# Patient Record
Sex: Female | Born: 1985 | Hispanic: Yes | Marital: Single | Smoking: Never smoker
Health system: Southern US, Community
[De-identification: ages and names within clinical notes are randomized; demographics above are authoritative.]

## PROBLEM LIST (undated history)

## (undated) ENCOUNTER — Inpatient Hospital Stay (HOSPITAL_COMMUNITY): Payer: Self-pay

## (undated) DIAGNOSIS — O24419 Gestational diabetes mellitus in pregnancy, unspecified control: Secondary | ICD-10-CM

---

## 2006-01-17 ENCOUNTER — Inpatient Hospital Stay (HOSPITAL_COMMUNITY): Admission: AD | Admit: 2006-01-17 | Discharge: 2006-01-20 | Payer: Self-pay | Admitting: Obstetrics

## 2006-05-21 ENCOUNTER — Emergency Department (HOSPITAL_COMMUNITY): Admission: EM | Admit: 2006-05-21 | Discharge: 2006-05-21 | Payer: Self-pay | Admitting: Emergency Medicine

## 2009-11-17 ENCOUNTER — Emergency Department (HOSPITAL_COMMUNITY): Admission: EM | Admit: 2009-11-17 | Discharge: 2009-11-17 | Payer: Self-pay | Admitting: Emergency Medicine

## 2011-07-19 ENCOUNTER — Other Ambulatory Visit: Payer: Self-pay

## 2011-07-19 ENCOUNTER — Emergency Department (HOSPITAL_COMMUNITY)
Admission: EM | Admit: 2011-07-19 | Discharge: 2011-07-19 | Disposition: A | Discharge status: Home / Self Care | Payer: Self-pay | Attending: Emergency Medicine | Admitting: Emergency Medicine

## 2011-07-19 DIAGNOSIS — K5289 Other specified noninfective gastroenteritis and colitis: Secondary | ICD-10-CM | POA: Insufficient documentation

## 2011-07-19 DIAGNOSIS — R42 Dizziness and giddiness: Secondary | ICD-10-CM

## 2011-07-19 DIAGNOSIS — Z136 Encounter for screening for cardiovascular disorders: Secondary | ICD-10-CM | POA: Insufficient documentation

## 2011-07-19 DIAGNOSIS — K529 Noninfective gastroenteritis and colitis, unspecified: Secondary | ICD-10-CM

## 2011-07-19 LAB — DIFFERENTIAL
Eosinophils Relative: 0 % (ref 0–5)
Lymphocytes Relative: 16 % (ref 12–46)
Lymphs Abs: 1 10*3/uL (ref 0.7–4.0)
Neutro Abs: 4.6 10*3/uL (ref 1.7–7.7)

## 2011-07-19 LAB — COMPREHENSIVE METABOLIC PANEL
ALT: 20 U/L (ref 0–35)
AST: 24 U/L (ref 0–37)
Alkaline Phosphatase: 56 U/L (ref 39–117)
CO2: 20 mEq/L (ref 19–32)
Calcium: 8.1 mg/dL — ABNORMAL LOW (ref 8.4–10.5)
Chloride: 108 mEq/L (ref 96–112)
GFR calc Af Amer: >90 mL/min (ref >90)
GFR calc non Af Amer: >90 mL/min (ref >90)
Glucose, Bld: 89 mg/dL (ref 70–99)
Potassium: 3.4 mEq/L — ABNORMAL LOW (ref 3.5–5.1)
Sodium: 138 mEq/L (ref 135–145)
Total Bilirubin: 0.2 mg/dL — ABNORMAL LOW (ref 0.3–1.2)

## 2011-07-19 LAB — URINALYSIS, ROUTINE W REFLEX MICROSCOPIC
Bilirubin Urine: NEGATIVE
Hgb urine dipstick: NEGATIVE
Ketones, ur: NEGATIVE mg/dL
Nitrite: NEGATIVE
Protein, ur: NEGATIVE mg/dL
Urobilinogen, UA: 0.2 mg/dL (ref 0.0–1.0)

## 2011-07-19 LAB — CBC
MCV: 93.3 fL (ref 78.0–100.0)
Platelets: 261 10*3/uL (ref 150–400)
RBC: 4.33 MIL/uL (ref 3.87–5.11)
WBC: 6.4 10*3/uL (ref 4.0–10.5)

## 2011-07-19 MED ORDER — SODIUM CHLORIDE 0.9 % IV BOLUS (SEPSIS)
1000.0000 mL | Freq: Once | INTRAVENOUS | Status: AC
  Administered 2011-07-19: 1000 mL via INTRAVENOUS

## 2011-07-19 MED ORDER — ONDANSETRON HCL 4 MG/2ML IJ SOLN
4.0000 mg | Freq: Once | INTRAMUSCULAR | Status: AC
  Administered 2011-07-19: 4 mg via INTRAVENOUS
  Filled 2011-07-19: qty 2

## 2011-07-19 MED ORDER — PROMETHAZINE HCL 25 MG PO TABS: 25.0000 mg | ORAL_TABLET | Freq: Four times a day (QID) | ORAL | Status: DC | PRN

## 2011-07-19 NOTE — ED Notes (Signed)
Diarrhea  Since last night with headache and body aches.  Pt. Denies any vomiting is nauseated

## 2011-07-19 NOTE — Discharge Instructions (Signed)
Gastroenteritis viral (Viral Gastroenteritis) La gastroenteritis viral tambin es conocida como gripe del estmago. Este trastorno afecta el estmago y el tubo digestivo. Puede causar diarrea y vmitos repentinos. La enfermedad generalmente dura entre 3 y 8 das. La mayora de las personas desarrolla una respuesta inmunolgica. Con el tiempo, esto elimina el virus. Mientras se desarrolla esta respuesta natural, el virus puede afectar en forma importante su salud.  CAUSAS Muchos virus diferentes pueden causar gastroenteritis, por ejemplo el rotavirus o el norovirus. Estos virus pueden contagiarse al consumir alimentos o agua contaminados. Tambin puede contagiarse al compartir utensilios u otros artculos personales con una persona infectada o al tocar una superficie contaminada.  SNTOMAS Los sntomas ms comunes son diarrea y vmitos. Estos problemas pueden causar una prdida grave de lquidos corporales(deshidratacin) y un desequilibrio de sales corporales(electrolitos). Otros sntomas pueden ser:   Fiebre.   Dolor de cabeza.   Fatiga.   Dolor abdominal.  DIAGNSTICO  El mdico podr hacer el diagnstico de gastroenteritis viral basndose en los sntomas y el examen fsico Tambin pueden tomarle una muestra de materia fecal para diagnosticar la presencia de virus u otras infecciones.  TRATAMIENTO Esta enfermedad generalmente desaparece sin tratamiento. Los tratamientos estn dirigidos a la rehidratacin. Los casos ms graves de gastroenteritis viral implican vmitos tan intensos que no es posible retener lquidos. En estos casos, los lquidos deben administrarse a travs de una va intravenosa (IV).  INSTRUCCIONES PARA EL CUIDADO DOMICILIARIO  Beba suficientes lquidos para mantener la orina clara o de color amarillo plido. Beba pequeas cantidades de lquido con frecuencia y aumente la cantidad segn la tolerancia.   Pida instrucciones especficas a su mdico con respecto a la  rehidratacin.   Evite:   Alimentos que tengan mucha azcar.   Alcohol.   Gaseosas.   Tabaco.   Jugos.   Bebidas con cafena.   Lquidos muy calientes o fros.   Alimentos muy grasos.   Comer demasiado a la vez.   Productos lcteos hasta 24 a 48 horas despus de que se detenga la diarrea.   Puede consumir probiticos. Los probiticos son cultivos activos de bacterias beneficiosas. Pueden disminuir la cantidad y el nmero de deposiciones diarreicas en el adulto. Se encuentran en los yogures con cultivos activos y en los suplementos.   Lave bien sus manos para evitar que se disemine el virus.   Slo tome medicamentos de venta libre o recetados para calmar el dolor, las molestias o bajar la fiebre segn las indicaciones de su mdico. No administre aspirina a los nios. Los medicamentos antidiarreicos no son recomendables.   Consulte a su mdico si puede seguir tomando sus medicamentos recetados o de venta libre.   Cumpla con todas las visitas de control, segn le indique su mdico.  SOLICITE ATENCIN MDICA DE INMEDIATO SI:  No puede retener lquidos.   No hay emisin de orina durante 6 a 8 horas.   Le falta el aire.   Observa sangre en el vmito (se ve como caf molido) o en la materia fecal.   Siente dolor abdominal que empeora o se concentra en una zona pequea (se localiza).   Tiene nuseas o vmitos persistentes.   Tiene fiebre.   El paciente es un nio menor de 3 meses y tiene fiebre.   El paciente es un nio mayor de 3 meses, tiene fiebre y sntomas persistentes.   El paciente es un nio mayor de 3 meses y tiene fiebre y sntomas que empeoran repentinamente.   El paciente es   un beb y no tiene lgrimas cuando llora.  ASEGRESE QUE:   Comprende estas instrucciones.   Controlar su enfermedad.   Solicitar ayuda inmediatamente si no mejora o si empeora.  Document Released: 04/26/2005 Document Revised: 04/15/2011 [employer] Patient Information 2012  [employer]. 

## 2011-07-19 NOTE — ED Provider Notes (Addendum)
History     CSN: [id number]  Arrival date & time 07/19/11  0807   First MD Initiated Contact with Patient 07/19/11 [phone]      Chief Complaint  Patient presents with  . Diarrhea    (Consider location/radiation/quality/duration/timing/severity/associated sxs/prior treatment) HPI Pt states that she has had 20-25 episode of diarrhea since yesterday. No blood in stool. She has had nausea and dizziness worse with changing position. She c/o diffuse body aches and stomach cramping. No hearing changes, sore throat, rash, sick contacts, bad food exposure.  No past medical history on file.  No past surgical history on file.  No family history on file.  History  Substance Use Topics  . Smoking status: Not on file  . Smokeless tobacco: Not on file  . Alcohol Use: Not on file    OB History    No data available      Review of Systems  Constitutional: Positive for fatigue. Negative for fever and chills.  HENT: Negative for facial swelling and neck pain.   Respiratory: Negative for shortness of breath.   Cardiovascular: Negative for chest pain and leg swelling.  Gastrointestinal: Positive for nausea and diarrhea. Negative for vomiting, abdominal pain and blood in stool.  Genitourinary: Negative for dysuria.  Musculoskeletal: Positive for myalgias.  Neurological: Positive for dizziness. Negative for weakness and numbness.    Allergies  Review of patient's allergies indicates no known allergies.  Home Medications   Current Outpatient Rx  Name Route Sig Dispense Refill  . PROMETHAZINE HCL 25 MG PO TABS Oral Take 1 tablet (25 mg total) by mouth every 6 (six) hours as needed for nausea. 30 tablet 0    BP 106/72  Pulse 85  Temp(Src) 98.3 F (36.8 C) (Oral)  Resp 20  SpO2 100%  Physical Exam  Nursing note and vitals reviewed. Constitutional: She is oriented to person, place, and time. She appears well-developed and well-nourished. No distress.  HENT:  Head: Normocephalic and  atraumatic.  Mouth/Throat: Oropharynx is clear and moist. No oropharyngeal exudate.       Bl TM's normal  Eyes: EOM are normal. Pupils are equal, round, and reactive to light.       No nystagmus  Neck: Normal range of motion. Neck supple.  Cardiovascular: Normal rate and regular rhythm.   Pulmonary/Chest: Effort normal and breath sounds normal. No respiratory distress. She has no wheezes. She has no rales.  Abdominal: Soft. Bowel sounds are normal. There is no tenderness. There is no rebound and no guarding.  Musculoskeletal: Normal range of motion. She exhibits no edema and no tenderness.  Neurological: She is alert and oriented to person, place, and time.       5/5 motor, sensation intact  Skin: Skin is warm and dry. No rash noted. No erythema.  Psychiatric: She has a normal mood and affect. Her behavior is normal.    ED Course  Procedures (including critical care time)  Labs Reviewed  COMPREHENSIVE METABOLIC PANEL - Abnormal; Notable for the following:    Potassium 3.4 (*)    Creatinine, Ser 0.48 (*)    Calcium 8.1 (*)    Total Bilirubin 0.2 (*)    All other components within normal limits  CBC  DIFFERENTIAL  URINALYSIS, ROUTINE W REFLEX MICROSCOPIC  PREGNANCY, URINE   No results found.   1. Gastroenteritis   2. Dizziness      Date: 07/19/2011  Rate: 79  Rhythm: normal sinus rhythm  QRS Axis: normal  Intervals: normal  ST/T Wave abnormalities: normal  Conduction Disutrbances:none  Narrative Interpretation:   Old EKG Reviewed: none available    MDM   Pt states dizziness has improved. Tolerating PO's. Will d/c home. Return for any concerns       Loren Racer, MD 07/19/11 1200  Loren Racer, MD 07/19/11 [phone]

## 2011-07-19 NOTE — ED Notes (Signed)
MD at bedside. 

## 2011-07-19 NOTE — ED Notes (Signed)
Pt ambulated to restroom, gait steady.

## 2011-07-19 NOTE — ED Notes (Signed)
Gave ECG  To Dr. Lenny Pastel after I performed with trainee EMT Rosanne Ashing 10:12 JG.

## 2011-11-16 LAB — CYTOLOGY - PAP: Pap: NEGATIVE

## 2011-12-15 ENCOUNTER — Other Ambulatory Visit (HOSPITAL_COMMUNITY): Payer: Self-pay | Admitting: Nurse Practitioner

## 2011-12-15 DIAGNOSIS — Z1389 Encounter for screening for other disorder: Secondary | ICD-10-CM

## 2011-12-15 LAB — OB RESULTS CONSOLE ABO/RH: RH Type: POSITIVE

## 2011-12-15 LAB — OB RESULTS CONSOLE HGB/HCT, BLOOD
HCT: 39 %
Hemoglobin: 13.2 g/dL

## 2011-12-15 LAB — OB RESULTS CONSOLE PLATELET COUNT: Platelets: 229 10*3/uL

## 2011-12-15 LAB — OB RESULTS CONSOLE HIV ANTIBODY (ROUTINE TESTING): HIV: NONREACTIVE

## 2011-12-15 LAB — CULTURE, OB URINE: Urine Culture, OB: NEGATIVE

## 2011-12-16 LAB — OB RESULTS CONSOLE HEPATITIS B SURFACE ANTIGEN: Hepatitis B Surface Ag: NEGATIVE

## 2011-12-16 LAB — OB RESULTS CONSOLE VARICELLA ZOSTER ANTIBODY, IGG: Varicella: NON-IMMUNE/NOT IMMUNE

## 2011-12-20 DIAGNOSIS — O09899 Supervision of other high risk pregnancies, unspecified trimester: Secondary | ICD-10-CM

## 2011-12-20 DIAGNOSIS — IMO0002 Reserved for concepts with insufficient information to code with codable children: Secondary | ICD-10-CM | POA: Insufficient documentation

## 2011-12-22 DIAGNOSIS — O280 Abnormal hematological finding on antenatal screening of mother: Secondary | ICD-10-CM

## 2011-12-22 DIAGNOSIS — O0990 Supervision of high risk pregnancy, unspecified, unspecified trimester: Secondary | ICD-10-CM

## 2011-12-22 DIAGNOSIS — IMO0002 Reserved for concepts with insufficient information to code with codable children: Secondary | ICD-10-CM

## 2011-12-22 DIAGNOSIS — O09899 Supervision of other high risk pregnancies, unspecified trimester: Secondary | ICD-10-CM

## 2011-12-23 ENCOUNTER — Ambulatory Visit (INDEPENDENT_AMBULATORY_CARE_PROVIDER_SITE_OTHER): Payer: Self-pay | Admitting: Family

## 2011-12-23 ENCOUNTER — Ambulatory Visit (HOSPITAL_COMMUNITY)
Admission: RE | Admit: 2011-12-23 | Discharge: 2011-12-23 | Disposition: A | Discharge status: Home / Self Care | Payer: Medicaid Other | Source: Ambulatory Visit | Attending: Nurse Practitioner | Admitting: Nurse Practitioner

## 2011-12-23 ENCOUNTER — Encounter: Payer: Self-pay | Admitting: Family

## 2011-12-23 VITALS — BP 91/61 | Temp 97.1°F | Ht 60.0 in | Wt 110.6 lb

## 2011-12-23 DIAGNOSIS — Z363 Encounter for antenatal screening for malformations: Secondary | ICD-10-CM | POA: Insufficient documentation

## 2011-12-23 DIAGNOSIS — IMO0002 Reserved for concepts with insufficient information to code with codable children: Secondary | ICD-10-CM

## 2011-12-23 DIAGNOSIS — O0990 Supervision of high risk pregnancy, unspecified, unspecified trimester: Secondary | ICD-10-CM

## 2011-12-23 DIAGNOSIS — Z1389 Encounter for screening for other disorder: Secondary | ICD-10-CM | POA: Insufficient documentation

## 2011-12-23 DIAGNOSIS — O368990 Maternal care for other specified fetal problems, unspecified trimester, not applicable or unspecified: Secondary | ICD-10-CM | POA: Insufficient documentation

## 2011-12-23 DIAGNOSIS — O09219 Supervision of pregnancy with history of pre-term labor, unspecified trimester: Secondary | ICD-10-CM

## 2011-12-23 DIAGNOSIS — O358XX0 Maternal care for other (suspected) fetal abnormality and damage, not applicable or unspecified: Secondary | ICD-10-CM | POA: Insufficient documentation

## 2011-12-23 LAB — POCT URINALYSIS DIP (DEVICE)
Glucose, UA: NEGATIVE mg/dL
Nitrite: NEGATIVE
Protein, ur: NEGATIVE mg/dL
Specific Gravity, Urine: 1.015 (ref 1.005–1.030)
Urobilinogen, UA: 0.2 mg/dL (ref 0.0–1.0)

## 2011-12-23 NOTE — Progress Notes (Signed)
P= 70 Occasional abdominal pain and lower back

## 2011-12-23 NOTE — Progress Notes (Signed)
[facility] to [facility] of [facility]; pt reports [facility] at [facility], pap smear and states she agreed to quad screen (results pending).  Offered pt 17p and reviewed benefits of weekly injections, also reviewed risks of preterm birth with hx of prior preterm birth and possible sequelae of early delivery; pt declines 17p and accepts risks and potential consequences of preterm birth.  Reviewed ultrasound results with pt (marginal cord) and need for integral scan for growth.

## 2011-12-27 DIAGNOSIS — Z2839 Other underimmunization status: Secondary | ICD-10-CM | POA: Insufficient documentation

## 2011-12-27 DIAGNOSIS — O09899 Supervision of other high risk pregnancies, unspecified trimester: Secondary | ICD-10-CM | POA: Insufficient documentation

## 2011-12-27 DIAGNOSIS — O280 Abnormal hematological finding on antenatal screening of mother: Secondary | ICD-10-CM | POA: Insufficient documentation

## 2012-01-13 ENCOUNTER — Encounter: Payer: Self-pay | Admitting: Obstetrics and Gynecology

## 2012-01-13 ENCOUNTER — Ambulatory Visit (INDEPENDENT_AMBULATORY_CARE_PROVIDER_SITE_OTHER): Payer: Self-pay | Admitting: Obstetrics and Gynecology

## 2012-01-13 VITALS — BP 99/67 | Temp 99.0°F | Wt 113.8 lb

## 2012-01-13 DIAGNOSIS — O368990 Maternal care for other specified fetal problems, unspecified trimester, not applicable or unspecified: Secondary | ICD-10-CM

## 2012-01-13 DIAGNOSIS — O09219 Supervision of pregnancy with history of pre-term labor, unspecified trimester: Secondary | ICD-10-CM

## 2012-01-13 DIAGNOSIS — O280 Abnormal hematological finding on antenatal screening of mother: Secondary | ICD-10-CM

## 2012-01-13 LAB — POCT URINALYSIS DIP (DEVICE)
Glucose, UA: NEGATIVE mg/dL
Ketones, ur: NEGATIVE mg/dL
Protein, ur: NEGATIVE mg/dL
Specific Gravity, Urine: 1.01 (ref 1.005–1.030)
Urobilinogen, UA: 0.2 mg/dL (ref 0.0–1.0)

## 2012-01-13 NOTE — Progress Notes (Signed)
Pulse- 74 Pain- low back

## 2012-01-13 NOTE — Patient Instructions (Signed)
Parto prematuro  (Preterm Labor) El parto prematuro comienza antes de la semana 37 de embarazo. La duracin de un embarazo normal es de 39 a 41 semanas.  CAUSAS  Generalmente no hay una causa que pueda identificarse. Sin embargo, una de las causas conocidas ms frecuentes son las infecciones. Las infecciones del tero, el cuello, la vagina, el lquido amnitico, la vejiga, los riones y hasta de los pulmones (neumona) pueden hacer que el trabajo de parto se inicie. Otras causas son:   Infecciones urogenitales, como infecciones por hongos y vaginosis bacteriana.   Anormalidades uterinas (forma del tero, sptum uterino, fibromas, hemorragias en la placenta).   Un cuello que ha sido operado y se abre prematuramente.   Malformaciones del beb.   Gestaciones mltiples (mellizos, trillizos y ms).   Ruptura del saco amnitico.  :Otros factores de riesgo del parto prematuro son   Historia previa de parto prematuro.   Ruptura prematura de las membranas.   La placenta cubre la apertura del cuello (placenta previa).   La placenta se separa del tero (abrupcin placentaria).   El cuello es demasiado dbil para contener al beb en el tero (cuello incompetente).   Hay mucho lquido en el saco amnitico (polihidramnios).   Consumo de drogas o hbito de fumar durante el embarazo.   No aumentar de peso lo suficiente durante el embarazo.   Mujeres menores de 18 aos o mayores de 35 aos aos.   Nivel socioeconmico bajo.   Raza afroamericana.  SNTOMAS  Los signos y sntomas son:   Clicos del tipo menstrual   Contracciones con un intervalo entre 30 y 70 segundos, comienzan a ser regulares, se hacen ms frecuentes y se hacen ms intensas y dolorosas.   Contracciones que comienzan en la parte superior del tero y se expanden hacia abajo, hacia la zona inferior del abdomen y la espalda.   Sensacin de presin en la pelvis o dolor en la espalda.   Aparece una secrecin acuosa o  sanguinolenta por la vagina.  DIAGNSTICO  El diagnstico puede confirmarse:   Con un examen vaginal.   Ecografa del cuello.   Muestra (hisopado) de las secreciones crvico-vaginales. Estas muestras se analizan para buscar la presencia de fibronectina fetal. Esta protena que se encuentra en las secreciones del tero y se asocia con el parto prematuro.   Monitoreo fetal  TRATAMIENTO  Segn el tiempo del embarazo y otras circunstancias, el mdico puede indicar reposo en cama. Si es necesario, le indicarn medicamentos para detener las contracciones y apurar la maduracin de los pulmones del feto. Si el trabajo de parto se inicia antes de las 34 semanas de embarazo, se recomienda la hospitalizacin. El tratamiento depende de las condiciones en que se encuentre la madre y el beb.  PREVENCIN  Hay algunas cosas que una madre puede hacer para disminuir el riesgo de trabajo de parto prematuro en futuros embarazos. Una mam puede:   Dejar de fumar.   Mantener un peso saludable y evitar sustancias qumicas y drogas innecesarias.   Controlar todo tipo de infeccin.   Informar al mdico si tiene una historia conocida de parto prematuro.  Document Released: 08/03/2007 Document Revised: 04/15/2011 [employer] Patient Information 2012 [employer]. 

## 2012-01-13 NOTE — Progress Notes (Signed)
Positional LBP. No abd pain or pelvic pressure. Declined 17P.  Reviewed increased risk NTD and marginal cord insertion with pt and plan to get MFM detailed [place].

## 2012-01-13 NOTE — Progress Notes (Signed)
U/S scheduled with MFM on Sept. 10, 2013 at 3 pm.

## 2012-01-18 ENCOUNTER — Encounter (HOSPITAL_COMMUNITY): Payer: Self-pay

## 2012-01-18 ENCOUNTER — Ambulatory Visit (HOSPITAL_COMMUNITY)
Admission: RE | Admit: 2012-01-18 | Discharge: 2012-01-18 | Disposition: A | Discharge status: Home / Self Care | Payer: Medicaid Other | Source: Ambulatory Visit | Attending: Obstetrics and Gynecology | Admitting: Obstetrics and Gynecology

## 2012-01-18 VITALS — BP 101/65 | HR 76 | Wt 117.8 lb

## 2012-01-18 DIAGNOSIS — O0990 Supervision of high risk pregnancy, unspecified, unspecified trimester: Secondary | ICD-10-CM

## 2012-01-18 DIAGNOSIS — O280 Abnormal hematological finding on antenatal screening of mother: Secondary | ICD-10-CM

## 2012-01-18 DIAGNOSIS — O3500X0 Maternal care for (suspected) central nervous system malformation or damage in fetus, unspecified, not applicable or unspecified: Secondary | ICD-10-CM | POA: Insufficient documentation

## 2012-01-18 DIAGNOSIS — O350XX0 Maternal care for (suspected) central nervous system malformation in fetus, not applicable or unspecified: Secondary | ICD-10-CM | POA: Insufficient documentation

## 2012-01-18 DIAGNOSIS — IMO0002 Reserved for concepts with insufficient information to code with codable children: Secondary | ICD-10-CM

## 2012-01-18 DIAGNOSIS — Z283 Underimmunization status: Secondary | ICD-10-CM

## 2012-01-18 DIAGNOSIS — Z8751 Personal history of pre-term labor: Secondary | ICD-10-CM | POA: Insufficient documentation

## 2012-01-18 DIAGNOSIS — Z1389 Encounter for screening for other disorder: Secondary | ICD-10-CM | POA: Insufficient documentation

## 2012-01-18 DIAGNOSIS — O358XX0 Maternal care for other (suspected) fetal abnormality and damage, not applicable or unspecified: Secondary | ICD-10-CM | POA: Insufficient documentation

## 2012-01-18 DIAGNOSIS — Z363 Encounter for antenatal screening for malformations: Secondary | ICD-10-CM | POA: Insufficient documentation

## 2012-01-18 NOTE — Progress Notes (Signed)
Alyssa Wallace  was seen today for an ultrasound appointment.  See full report in [facility].  Alpha Gula, MD  [name] is referred due to elevated MSAFP (3.17 MOM).  Normal detailed fetal anatomy was noted - the spine, posterior fossa, cerebellum and cord insertion were noted.  A marginal vs. velamentous cord insertion was appreciated.  Findings and limitations of the ultrasound were reviewed with the patient.  Recommend follow up growth scan in 6 weeks due to the association of IUGR and elevated MSAFP.  Single IUP at 22 4/7 weeks Normal detailed fetal anatomy Marginal vs. velamentous placental cord insertion noted Normal amniotic fluid volume  Recommend follow up ultrasound in 6 weeks for interval growth.

## 2012-01-24 ENCOUNTER — Encounter: Payer: Self-pay | Admitting: *Deleted

## 2012-02-03 ENCOUNTER — Ambulatory Visit (INDEPENDENT_AMBULATORY_CARE_PROVIDER_SITE_OTHER): Payer: Medicaid Other | Admitting: Obstetrics and Gynecology

## 2012-02-03 VITALS — BP 97/61 | Temp 97.0°F | Wt 117.4 lb

## 2012-02-03 DIAGNOSIS — Z23 Encounter for immunization: Secondary | ICD-10-CM

## 2012-02-03 DIAGNOSIS — O09219 Supervision of pregnancy with history of pre-term labor, unspecified trimester: Secondary | ICD-10-CM

## 2012-02-03 LAB — POCT URINALYSIS DIP (DEVICE)
Bilirubin Urine: NEGATIVE
Hgb urine dipstick: NEGATIVE
Nitrite: NEGATIVE
pH: 7 (ref 5.0–8.0)

## 2012-02-03 MED ORDER — INFLUENZA VIRUS VACC SPLIT PF IM SUSP
0.5000 mL | Freq: Once | INTRAMUSCULAR | Status: AC
  Administered 2012-02-03: 0.5 mL via INTRAMUSCULAR

## 2012-02-03 NOTE — Patient Instructions (Signed)
Embarazo - Segundo trimestre (Pregnancy - Second Trimester) El segundo trimestre del embarazo (del 3 al 6mes) es un perodo de evolucin rpida para usted y el beb. Hacia el final del sexto mes, el beb mide aproximadamente 23 cm y pesa 680 g. Comenzar a sentir los movimientos del beb entre las 18 y las 20 semanas de embarazo. Podr sentir las pataditas ("quickening en ingls"). Hay un rpido aumento de peso. Puede segregar un lquido claro (calostro) de las mamas. Quizs sienta pequeas contracciones en el vientre (tero) Esto se conoce como falso trabajo de parto o contracciones de Braxton-Hicks. Es como una prctica del trabajo de parto que se produce cuando el beb est listo para salir. Generalmente los problemas de vmitos matinales ya se han superado hacia el final del primer trimestre. Algunas mujeres desarrollan pequeas manchas oscuras (que se denominan cloasma, mscara del embarazo) en la cara que normalmente se van luego del nacimiento del beb. La exposicin al sol empeora las manchas. Puede desarrollarse acn en algunas mujeres embarazadas, y puede desaparecer en aquellas que ya tienen acn. EXAMENES PRENATALES  Durante los exmenes prenatales, deber seguir realizando pruebas de sangre, segn avance el embarazo. Estas pruebas se realizan para controlar su salud y la del beb. Tambin se realizan anlisis de sangre para conocer los niveles de hemoglobina. La anemia (bajo nivel de hemoglobina) es frecuente durante el embarazo. Para prevenirla, se administran hierro y vitaminas. Tambin se le realizarn exmenes para saber si tiene diabetes entre las 24 y las 28 semanas del embarazo. Podrn repetirle algunas de las pruebas que le hicieron previamente.  En cada visita le medirn el tamao del tero. Esto se realiza para asegurarse de que el beb est creciendo correctamente de acuerdo al estado del embarazo.  Tambin en cada visita prenatal controlarn su presin arterial. Esto se realiza  para asegurarse de que no tenga toxemia.  Se controlar su orina para asegurarse de que no tenga infecciones, diabetes o protena en la orina.  Se controlar su peso regularmente para asegurarse que el aumento ocurre al ritmo indicado. Esto se hace para asegurarse que usted y el beb tienen una evolucin normal.  En algunas ocasiones se realiza una prueba de ultrasonido para confirmar el correcto desarrollo y evolucin del beb. Esta prueba se realiza con ondas sonoras inofensivas para el beb, de modo que el profesional pueda calcular ms precisamente la fecha del parto. Algunas veces se realizan pruebas especializadas del lquido amnitico que rodea al beb. Esta prueba se denomina amniocentesis. El lquido amnitico se obtiene introduciendo una aguja en el vientre (abdomen). Se realiza para controlar los cromosomas en aquellos casos en los que existe alguna preocupacin acerca de algn problema gentico que pueda sufrir el beb. En ocasiones se lleva a cabo cerca del final del embarazo, si es necesario inducir al parto. En este caso se realiza para asegurarse que los pulmones del beb estn lo suficientemente maduros como para que pueda vivir fuera del tero. CAMBIOS QUE OCURREN EN EL SEGUNDO TRIMESTRE DEL EMBARAZO Su organismo atravesar numerosos cambios durante el embarazo. Estos pueden variar de una persona a otra. Converse con el profesional que la asiste acerca los cambios que usted note y que la preocupen.  Durante el segundo trimestre probablemente sienta un aumento del apetito. Es normal tener "antojos" de ciertas comidas. Esto vara de una persona a otra y de un embarazo a otro.  El abdomen inferior comenzar a abultarse.  Podr tener la necesidad de orinar con ms frecuencia debido a que   el tero y el beb presionan sobre la vejiga. Tambin es frecuente contraer ms infecciones urinarias durante el embarazo (dolor al orinar). Puede evitarlas bebiendo gran cantidad de lquidos y vaciando  la vejiga antes y despus de mantener relaciones sexuales.  Podrn aparecer las primeras estras en las caderas, abdomen y mamas. Estos son cambios normales del cuerpo durante el embarazo. No existen medicamentos ni ejercicios que puedan prevenir estos cambios.  Es posible que comience a desarrollar venas inflamadas y abultadas (varices) en las piernas. El uso de medias de descanso, elevar sus pies durante 15 minutos, 3 a 4 veces al da y limitar la sal en su dieta ayuda a aliviar el problema.  Podr sentir acidez gstrica a medida que el tero crece y presiona contra el estmago. Puede tomar anticidos, con la autorizacin de su mdico, para aliviar este problema. Tambin es til ingerir pequeas comidas 4 a 5 veces al da.  La constipacin puede tratarse con un laxante o agregando fibra a su dieta. Beber grandes cantidades de lquidos, comer vegetales, frutas y granos integrales es de gran ayuda.  Tambin es beneficioso practicar actividad fsica. Si ha sido una persona activa hasta el embarazo, podr continuar con la mayora de las actividades durante el mismo. Si ha sido menos activa, puede ser beneficioso que comience con un programa de ejercicios, como realizar caminatas.  Puede desarrollar hemorroides (vrices en el recto) hacia el final del segundo trimestre. Tomar baos de asiento tibios y utilizar cremas recomendadas por el profesional que lo asiste sern de ayuda para los problemas de hemorroides.  Tambin podr sentir dolor de espalda durante este momento de su embarazo. Evite levantar objetos pesados, utilice zapatos de taco bajo y mantenga una buena postura para ayudar a reducir los problemas de espalda.  Algunas mujeres embarazadas desarrollan hormigueo y adormecimiento de la mano y los dedos debido a la hinchazn y compresin de los ligamentos de la mueca (sndrome del tnel carpiano). Esto desaparece una vez que el beb nace.  Como sus pechos se agrandan, necesitar un sujetador  ms grande. Use un sostn de soporte, cmodo y de algodn. No utilice un sostn para amamantar hasta el ltimo mes de embarazo si va a amamantar al beb.  Podr observar una lnea oscura desde el ombligo hacia la zona pbica denominada linea nigra.  Podr observar que sus mejillas se ponen coloradas debido al aumento de flujo sanguneo en la cara.  Podr desarrollar "araitas" en la cara, cuello y pecho. Esto desaparece una vez que el beb nace. INSTRUCCIONES PARA EL CUIDADO DOMICILIARIO  Es extremadamente importante que evite el cigarrillo, hierbas medicinales, alcohol y las drogas no prescriptas durante el embarazo. Estas sustancias qumicas afectan la formacin y el desarrollo del beb. Evite estas sustancias durante todo el embarazo para asegurar el nacimiento de un beb sano.  La mayor parte de los cuidados que se aconsejan son los mismos que los indicados para el primer trimestre del embarazo. Cumpla con las citas tal como se le indic. Siga las instrucciones del profesional que lo asiste con respecto al uso de los medicamentos, el ejercicio y la dieta.  Durante el embarazo debe obtener nutrientes para usted y para su beb. Consuma alimentos balanceados a intervalos regulares. Elija alimentos como carne, pescado, leche y otros productos lcteos descremados, vegetales, frutas, panes integrales y cereales. El profesional le informar cul es el aumento de peso ideal.  Las relaciones sexuales fsicas pueden continuarse hasta cerca del fin del embarazo si no existen otros problemas. Estos   problemas pueden ser la prdida temprana (prematura) de lquido amnitico de las membranas, sangrado vaginal, dolor abdominal u otros problemas mdicos o del embarazo.  Realice actividad fsica todos los das, si no tiene restricciones. Consulte con el profesional que la asiste si no sabe con certeza si determinados ejercicios son seguros. El mayor aumento de peso tiene lugar durante los ltimos 2 trimestres del  embarazo. El ejercicio la ayudar a:  Controlar su peso.  Ponerla en forma para el parto.  Ayudarla a perder peso luego de haber dado a luz.  Use un buen sostn o como los que se usan para hacer deportes para aliviar la sensibilidad de las mamas. Tambin puede serle til si lo usa mientras duerme. Si pierde calostro, podr utilizar apsitos en el sostn.  No utilice la baera con agua caliente, baos turcos y saunas durante el embarazo.  Utilice el cinturn de seguridad sin excepcin cuando conduzca. Este la proteger a usted y al beb en caso de accidente.  Evite comer carne cruda, queso crudo, y el contacto con los utensilios y desperdicios de los gatos. Estos elementos contienen grmenes que pueden causar defectos de nacimiento en el beb.  El segundo trimestre es un buen momento para visitar a su dentista y evaluar su salud dental si an no lo ha hecho. Es importante mantener los dientes limpios. Utilice un cepillo de dientes blando. Cepllese ms suavemente durante el embarazo.  Es ms fcil perder algo de orina durante el embarazo. Apretar y fortalecer los msculos de la pelvis la ayudar con este problema. Practique detener la miccin cuando est en el bao. Estos son los mismos msculos que necesita fortalecer. Son tambin los mismos msculos que utiliza cuando trata de evitar los gases. Puede practicar apretando estos msculos 10 veces, y repetir esto 3 veces por da aproximadamente. Una vez que conozca qu msculos debe apretar, no realice estos ejercicios durante la miccin. Puede favorecerle una infeccin si la orina vuelve hacia atrs.  Pida ayuda si tiene necesidades econmicas, de asesoramiento o nutricionales durante el embarazo. El profesional podr ayudarla con respecto a estas necesidades, o derivarla a otros especialistas.  La piel puede ponerse grasa. Si esto sucede, lvese la cara con un jabn suave, utilice un humectante no graso y maquillaje con base de aceite o  crema. CONSUMO DE MEDICAMENTOS Y DROGAS DURANTE EL EMBARAZO  Contine tomando las vitaminas apropiadas para esta etapa tal como se le indic. Las vitaminas deben contener un miligramo de cido flico y deben suplementarse con hierro. Guarde todas las vitaminas fuera del alcance de los nios. La ingestin de slo un par de vitaminas o tabletas que contengan hierro puede ocasionar la muerte en un beb o en un nio pequeo.  Evite el uso de medicamentos, inclusive los de venta libre y hierbas que no hayan sido prescriptos o indicados por el profesional que la asiste. Algunos medicamentos pueden causar problemas fsicos al beb. Utilice los medicamentos de venta libre o de prescripcin para el dolor, el malestar o la fiebre, segn se lo indique el profesional que lo asiste. No utilice aspirina.  El consumo de alcohol est relacionado con ciertos defectos de nacimiento. Esto incluye el sndrome de alcoholismo fetal. Debe evitar el consumo de alcohol en cualquiera de sus formas. El cigarrillo causa nacimientos prematuros y bebs de bajo peso. El uso de drogas recreativas est absolutamente prohibido. Son muy nocivas para el beb. Un beb que nace de una madre adicta, ser adicto al nacer. Ese beb tendr los mismos   causa nacimientos prematuros y bebs de [place]. El uso de drogas recreativas est absolutamente prohibido. Son muy nocivas para el beb. Un beb que nace de [employer], ser adicto al nacer. Ese beb tendr los mismos sntomas de abstinencia que un adulto.   Infrmele al profesional si consume alguna droga.   No consuma drogas ilegales. Pueden causarle mucho dao al beb.  SOLICITE ATENCIN MDICA SI: Tiene preguntas o preocupaciones durante su embarazo. Es mejor que llame para Science writer las dudas que esperar hasta su prxima visita prenatal. Thressa Sheller forma se sentir ms tranquila.  SOLICITE ATENCIN MDICA DE INMEDIATO SI:  La temperatura oral se eleva sin motivo por encima de 102 F (38.9 C) o segn le indique el profesional que lo asiste.   Tiene una prdida de lquido por la vagina (canal de parto). Si sospecha una ruptura de las [place], tmese la  temperatura y llame al profesional para informarlo sobre esto.   Observa unas pequeas manchas, una hemorragia vaginal o elimina cogulos. Notifique al profesional acerca de la cantidad y de cuntos apsitos est utilizando. Unas pequeas manchas de sangre son algo comn durante el Psychiatrist, especialmente despus de Sales promotion account executive.   Presenta un olor desagradable en la secrecin vaginal y observa un cambio en el color, de transparente a blanco.   Contina con las nuseas y no obtiene alivio de los remedios indicados. Vomita sangre o algo similar a la borra del caf.   [place] o sube ms de 900 g. en una semana, o segn lo indicado por el profesional que la asiste.   Observa que se le [employer], las manos, los pies o las piernas.   Ha estado expuesta a la rubola y no ha sufrido la enfermedad.   Ha estado expuesta a la quinta enfermedad o a la varicela.   Presenta dolor abdominal. Las molestias en el ligamento redondo son Neomia Dear causa no cancerosa (benigna) frecuente de dolor abdominal durante el embarazo. El profesional que la asiste deber evaluarla.   Presenta dolor de cabeza intenso que no se [place].   Presenta fiebre, diarrea, dolor al orinar o le falta la respiracin.   Presenta dificultad para ver, visin borrosa, o visin doble.   Sufre una cada, un accidente de trnsito o cualquier tipo de trauma.   Vive en un hogar en el que existe violencia fsica o mental.  Document Released: 02/03/2005 Document Revised: 04/15/2011 [facility] Patient Information 2012 [place], [place].

## 2012-02-03 NOTE — Progress Notes (Signed)
No s/sx PTL. Flu vaccine given. Reviewed MFM [place]: velamentous insertion of cord. Reviewed risks MSAFP elevation. Has F/U scan scheduled for Oct 22.

## 2012-02-03 NOTE — Progress Notes (Signed)
Pulse 67 Pt would like flu vaccine

## 2012-02-29 ENCOUNTER — Ambulatory Visit (HOSPITAL_COMMUNITY): Payer: Medicaid Other

## 2012-02-29 ENCOUNTER — Ambulatory Visit (HOSPITAL_COMMUNITY)
Admission: RE | Admit: 2012-02-29 | Discharge: 2012-02-29 | Disposition: A | Discharge status: Home / Self Care | Payer: Medicaid Other | Source: Ambulatory Visit | Attending: Obstetrics and Gynecology | Admitting: Obstetrics and Gynecology

## 2012-02-29 ENCOUNTER — Encounter (HOSPITAL_COMMUNITY): Payer: Self-pay

## 2012-02-29 VITALS — BP 111/76 | HR 68 | Wt 123.2 lb

## 2012-02-29 DIAGNOSIS — O43899 Other placental disorders, unspecified trimester: Secondary | ICD-10-CM

## 2012-02-29 DIAGNOSIS — O0990 Supervision of high risk pregnancy, unspecified, unspecified trimester: Secondary | ICD-10-CM

## 2012-02-29 DIAGNOSIS — Z2839 Other underimmunization status: Secondary | ICD-10-CM

## 2012-02-29 DIAGNOSIS — Z8751 Personal history of pre-term labor: Secondary | ICD-10-CM | POA: Insufficient documentation

## 2012-02-29 DIAGNOSIS — O280 Abnormal hematological finding on antenatal screening of mother: Secondary | ICD-10-CM

## 2012-02-29 DIAGNOSIS — O350XX0 Maternal care for (suspected) central nervous system malformation in fetus, not applicable or unspecified: Secondary | ICD-10-CM | POA: Insufficient documentation

## 2012-02-29 DIAGNOSIS — O3500X0 Maternal care for (suspected) central nervous system malformation or damage in fetus, unspecified, not applicable or unspecified: Secondary | ICD-10-CM | POA: Insufficient documentation

## 2012-02-29 DIAGNOSIS — O09899 Supervision of other high risk pregnancies, unspecified trimester: Secondary | ICD-10-CM

## 2012-02-29 DIAGNOSIS — Z283 Underimmunization status: Secondary | ICD-10-CM

## 2012-02-29 NOTE — Progress Notes (Signed)
Alyssa Wallace  was seen today for an ultrasound appointment.  See full report in [facility].  Alpha Gula, MD  Single IUP at 28 4/7 weeks Follow up due to elevated MSAFP (3.17 MOM) Normal interval anatomy Interval growth is appropriate (48th %tile) Amniotic fluid volume is subjectively low-normal  Recommend follow up in 3 weeks for growth and to reevaluate the amniotic fluid volume.

## 2012-03-02 ENCOUNTER — Ambulatory Visit (INDEPENDENT_AMBULATORY_CARE_PROVIDER_SITE_OTHER): Payer: Self-pay | Admitting: Family Medicine

## 2012-03-02 VITALS — BP 105/70 | Temp 97.8°F | Wt 122.1 lb

## 2012-03-02 DIAGNOSIS — O09219 Supervision of pregnancy with history of pre-term labor, unspecified trimester: Secondary | ICD-10-CM

## 2012-03-02 DIAGNOSIS — O0990 Supervision of high risk pregnancy, unspecified, unspecified trimester: Secondary | ICD-10-CM

## 2012-03-02 LAB — POCT URINALYSIS DIP (DEVICE)
Ketones, ur: NEGATIVE mg/dL
Leukocytes, UA: NEGATIVE
Protein, ur: NEGATIVE mg/dL
pH: 7 (ref 5.0–8.0)

## 2012-03-02 NOTE — Progress Notes (Signed)
RLQ pain for 10 min last night, resolved, likely musculoskeletal. No signs PTL. 1 hour GTT today. Growth sono done today, pt told fluid is low. AFI 9.08, largest pocket 3.82 cm, subjectively low-normal. Has repeat for growth/AFI on 11/12.

## 2012-03-02 NOTE — Progress Notes (Signed)
P =72 Mild edema in feet

## 2012-03-02 NOTE — Patient Instructions (Signed)
Embarazo  Tercer trimestre  (Pregnancy - Third Trimester) El tercer trimestre del embarazo (los ltimos 3 meses) es el perodo en el cual tanto usted como su beb crecen con ms rapidez. El beb alcanza un largo de aproximadamente 50 cm. y pesa entre 2,700 y 4,500 kg. El beb gana ms tejido graso y est listo para la vida fuera del cuerpo de la madre. Mientras estn en el interior, los bebs tienen perodos de sueo y vigilia, succionan el pulgar y tienen hipo. Quizs sienta pequeas contracciones del tero. Este es el falso trabajo de parto. Tambin se las conoce como contracciones de Braxton-Hicks . Es como una prctica del parto. Los problemas ms habituales de esta etapa del embarazo incluyen mayor dificultad para respirar, hinchazn de las manos y los pies por retencin de lquidos y la necesidad de orinar con ms frecuencia debido a que el tero y el beb presionan sobre la vejiga.  EXAMENES PRENATALES   Durante los exmenes prenatales, deber seguir realizndose anlisis de sangre. Estas pruebas se realizan para controlar su salud y la del beb. Los anlisis de sangre se realizan para conocer los niveles de algunos compuestos de la sangre (hemoglobina). La anemia (bajo nivel de hemoglobina) es frecuente durante el embarazo. Para prevenirla, se administran hierro y vitaminas. Tambin le tomarn nuevas anlisis para descartar diabetes. Podrn repetirle algunas de las pruebas que le hicieron previamente.  En cada visita le medirn el tamao del tero. Esto permite asegurar que el beb se desarrolla adecuadamente, segn la fecha del embarazo.  Le controlarn la presin arterial en cada visita prenatal. Esto es para asegurarse de que no sufre toxemia.  Le harn un anlisis de orina en cada visita prenatal, para descartar infecciones, diabetes y la presencia de protenas.  Tambin en cada visita controlarn su peso. Esto se realiza para asegurarse que aumenta de peso al ritmo indicado y que usted y su  beb evolucionan normalmente.  En algunas ocasiones se realiza una prueba de ultrasonido para confirmar el correcto desarrollo y evolucin del beb. Esta prueba se realiza con ondas sonoras inofensivas para el beb, de modo que el profesional pueda calcular ms precisamente la fecha del parto.  Analice con su mdico los analgsicos y la anestesia que recibir durante el trabajo de parto y el parto.  Comente la posibilidad de que necesite una cesrea y qu anestesia se recibir.  Informe a su mdico si sufre violencia familiar mental o fsica. A veces, se indica la prueba especializada sin estrs, la prueba de tolerancia a las contracciones y el perfil biofsico para asegurarse de que el beb no tiene problemas. El estudio del lquido amnitico que rodea al beb se llama amniocentesis. El lquido amnitico se obtiene introduciendo una aguja en el vientre (abdomen ). En ocasiones se lleva a cabo cerca del final del embarazo, si es necesario inducir a un parto. En este caso se realiza para asegurarse que los pulmones del beb estn lo suficientemente maduros como para que pueda vivir fuera del tero. Si los pulmones no han madurado y es peligroso que el beb nazca, se administrar a la madre una inyeccin de cortisona , 1 a 2 das antes del parto. . Esto ayuda a que los pulmones del beb maduren y sea ms seguro su nacimiento.  CAMBIOS QUE OCURREN EN EL TERCER TRIMESTRE DEL EMBARAZO  Su organismo atravesar numerosos cambios durante el embarazo. Estos pueden variar de una persona a otra. Converse con el profesional que la asiste acerca los cambios que   usted note y que la preocupen.   Durante el ltimo trimestre probablemente sienta un aumento del apetito. Es normal tener "antojos" de ciertas comidas. Esto vara de una persona a otra y de un embarazo a otro.  Podrn aparecer las primeras estras en las caderas, abdomen y mamas. Estos son cambios normales del cuerpo durante el embarazo. No existen  medicamentos ni ejercicios que puedan prevenir estos cambios.  La constipacin puede tratarse con un laxante o agregando fibra a su dieta. Beber grandes cantidades de lquidos, tomar fibras en forma de vegetales, frutas y granos integrales es de gran ayuda.  Tambin es beneficioso practicar actividad fsica. Si ha sido una persona activa hasta el embarazo, podr continuar con la mayora de las actividades durante el mismo. Si ha sido menos activa, puede ser beneficioso que comience con un programa de ejercicios, como realizar caminatas. Consulte con el profesional que la asiste antes de comenzar un programa de ejercicios.  Evite el consumo de cigarrillos, el alcohol, los medicamentos no recetados y las "drogas de la calle" durante el embarazo. Estas sustancias qumicas afectan la formacin y el desarrollo del beb. Evite estas sustancias durante todo el embarazo para asegurar el nacimiento de un beb sano.  Podr sentir dolor de espalda, tener vrices en las venas y hemorroides, o si ya los sufra, pueden empeorar.  Durante el tercer trimestre se cansar con ms facilidad, lo cual es normal.  Los movimientos del beb pueden ser ms fuertes y con ms frecuencia.  Puede que note dificultades para respirar normalmente.  El ombligo puede salir hacia afuera.  A veces sale una secrecin amarilla de las mamas, que se llama calostro.  Podr aparecer una secrecin mucosa con sangre. Esto suele ocurrir entre unos pocos das y una semana antes del parto. INSTRUCCIONES PARA EL CUIDADO EN EL HOGAR   Cumpla con las citas de control. Siga las indicaciones del mdico con respecto al uso de medicamentos, los ejercicios y la dieta.  Durante el embarazo debe obtener nutrientes para usted y para su beb. Consuma alimentos balanceados a intervalos regulares. Elija alimentos como carne, pescado, leche y otros productos lcteos descremados, vegetales, frutas, panes integrales y cereales. El mdico le informar  cul es el aumento de peso ideal.  Las relaciones sexuales pueden continuarse hasta casi el final del embarazo, si no se presentan otros problemas como prdida prematura (antes de tiempo) de lquido amnitico, hemorragia vaginal o dolor en el vientre (abdominal).  Realice actividad fsica todos los das, si no tiene restricciones. Consulte con el profesional que la asiste si no sabe con certeza si determinados ejercicios son seguros. El mayor aumento de peso se producir en los ltimos 2 trimestres del embarazo. El ejercicio ayuda a:  Controlar su peso.  Mantenerse en forma para el trabajo de parto y el parto .  Perder peso despus del parto.  Haga reposo con frecuencia, con las piernas elevadas, o segn lo necesite para evitar los calambres y el dolor de cintura.  Use un buen sostn o como los que se usan para hacer deportes para aliviar la sensibilidad de las mamas. Tambin puede serle til si lo usa mientras duerme. Si pierde calostro, podr utilizar apsitos en el sostn.  No utilice la baera con agua caliente, baos turcos y saunas.  Colquese el cinturn de seguridad cuando conduzca. Este la proteger a usted y al beb en caso de accidente.  Evite comer carne cruda y el contacto con los utensilios y desperdicios de los gatos. Estos elementos   contienen grmenes que pueden causar defectos de nacimiento en el beb.  Es fcil perder algo de orina durante el embarazo. Apretar y fortalecer los msculos de la pelvis la ayudar con este problema. Practique detener la miccin cuando est en el bao. Estos son los mismos msculos que necesita fortalecer. Son tambin los mismos msculos que utiliza cuando trata de evitar despedir gases. Puede practicar apretando estos msculos diez veces, y repetir esto tres veces por da aproximadamente. Una vez que conozca qu msculos debe apretar, no realice estos ejercicios durante la miccin. Puede favorecerle una infeccin si la orina vuelve hacia  atrs.  Pida ayuda si tienen necesidades financieras, teraputicas o nutricionales. El profesional podr ayudarla con respecto a estas necesidades, o derivarla a otros especialistas.  Haga una lista de nmeros telefnicos de emergencia y tngalos disponibles.  Planifique como obtener ayuda de familiares o amigos cuando regrese a casa desde el hospital.  Hacer un ensayo sobre la partida al hospital.  Tome clases prenatales con el padre para entender, practicar y hacer preguntas sobre el trabajo de parto y el alumbramiento.  Preparar la habitacin del beb / busque una guardera.  No viaje fuera de la ciudad a menos que sea absolutamente necesario y con el asesoramiento de su mdico.  Use slo zapatos de tacn bajo o sin tacn para tener mejor equilibrio y evitar cadas. USO DE MEDICAMENTOS Y CONSUMO DE DROGAS DURANTE EL EMBARAZO   Tome las vitaminas apropiadas para esta etapa tal como se le indic. Las vitaminas deben contener un miligramo de cido flico. Guarde todas las vitaminas fuera del alcance de los nios. La ingestin de slo un par de vitaminas o tabletas que contengan hierro pueden ocasionar la muerte en un beb o en un nio pequeo.  Evite el uso de todos los medicamentos, incluyendo hierbas, medicamentos de venta libre, sin receta o que no hayan sido sugeridos por su mdico. Slo tome medicamentos de venta libre o medicamentos recetados para el dolor, el malestar o fiebre como lo indique su mdico. No tome aspirina, ibuprofeno (Motrin, Advil, Nuprin) o naproxeno (Aleve) excepto que su mdico se lo indique.  Infrmele al profesional si consume alguna droga.  El alcohol se relaciona con ciertos defectos congnitos. Incluye el sndrome de alcoholismo fetal. Debe evitar absolutamente el consumo de alcohol, en cualquier forma. El fumar produce baja tasa de natalidad y bebs prematuros.  Las drogas ilegales o de la calle son muy perjudiciales para el beb. Estn absolutamente  prohibidas. Un beb que nace de una madre adicta, ser adicto al nacer. Ese beb tendr los mismos sntomas de abstinencia que un adulto. SOLICITE ATENCIN MDICA SI:  Tiene preguntas o preocupaciones relacionadas con el embarazo. Es mejor que llame para formular las preguntas si no puede esperar hasta la prxima visita, que sentirse preocupada por ellas.  DECISIONES ACERCA DE LA CIRCUNCISIN  Usted puede saber o no cul es el sexo de su beb. Si ya sabe que ser un varn, este es el momento de pensar acerca de la circuncisin. La circuncisin es la extirpacin del prepucio. Esta es la piel que cubre el extremo sensible del pene. No hay un motivo mdico que lo justifique. Generalmente la decisin se toma segn lo que sea popular en ese momento, o segn creencias religiosas. Podr conversar estos temas con su mdico o con el pediatra.  SOLICITE ATENCIN MDICA DE INMEDIATO SI:   La temperatura oral le sube a ms de 102 F (38.9 C) o lo que su mdico le   indique.  Tiene una prdida de lquido por la vagina (canal de parto). Si sospecha una ruptura de las membranas, tmese la temperatura y llame al profesional para informarlo sobre esto.  Observa unas pequeas manchas, una hemorragia vaginal o elimina cogulos. Notifique al profesional acerca de la cantidad y de cuntos apsitos est utilizando.  Presenta un olor desagradable en la secrecin vaginal y observa un cambio en el color, de transparente a blanco.  Ha vomitado durante ms de 24 horas.  Siente escalofros o le sube la fiebre.  Le falta el aire.  Siente ardor al orinar.  Baja o sube ms de 2 libras (900 g), o segn lo indicado por el profesional que la asiste.  Observa que sbitamente se le hinchan el rostro, las manos, los pies o las piernas.  Siente dolor en el vientre (abdominal). Las molestias en el ligamento redondo son una causa benigna frecuente de dolor abdominal durante el embarazo. El profesional que la asiste deber  evaluarla.  Presenta dolor de cabeza intenso que no se alivia.  Tiene problemas visuales, visin doble o borrosa.  Si no siente los movimientos del beb durante ms de 1 hora. Si piensa que el beb no se mueve tanto como lo haca habitualmente, coma algo que contenga azcar y recustese sobre el lado izquierdo durante una hora. El beb debe moverse al menos 4  5 veces por hora. Comunquese inmediatamente si el beb se mueve menos que lo indicado.  Se cae, se ve involucrada en un accidente automovilstico o sufre algn tipo de traumatismo.  En su hogar hay violencia mental o fsica. Document Released: 02/03/2005 Document Revised: 10/26/2011 [employer] Patient Information 2013 [employer].  

## 2012-03-03 LAB — CBC
HCT: 38.2 % (ref 36.0–46.0)
Hemoglobin: 13.1 g/dL (ref 12.0–15.0)
MCH: 33.1 pg (ref 26.0–34.0)
MCV: 96.5 fL (ref 78.0–100.0)
Platelets: 272 10*3/uL (ref 150–400)
RBC: 3.96 MIL/uL (ref 3.87–5.11)

## 2012-03-06 ENCOUNTER — Telehealth: Payer: Self-pay | Admitting: Obstetrics and Gynecology

## 2012-03-06 ENCOUNTER — Other Ambulatory Visit: Payer: Self-pay | Admitting: Family Medicine

## 2012-03-06 DIAGNOSIS — R7309 Other abnormal glucose: Secondary | ICD-10-CM

## 2012-03-06 NOTE — Telephone Encounter (Signed)
Message copied by Toula Moos on Mon Mar 06, 2012  8:53 AM ------      Message from: FERRY, [place]      Created: Mon Mar 06, 2012  7:57 AM       Needs 3 hour glucose tolerance test.

## 2012-03-06 NOTE — Telephone Encounter (Signed)
Called patient using Ana as interpreter. Patient told of 3 hr GTT test needed. Appt made for 03/07/12 @0800 . Patient agrees and satisfied.

## 2012-03-07 ENCOUNTER — Other Ambulatory Visit: Payer: Self-pay

## 2012-03-07 ENCOUNTER — Encounter: Payer: Self-pay | Admitting: Obstetrics & Gynecology

## 2012-03-07 DIAGNOSIS — R7309 Other abnormal glucose: Secondary | ICD-10-CM

## 2012-03-08 LAB — GLUCOSE TOLERANCE, 3 HOURS
Glucose Tolerance, 2 hour: 130 mg/dL (ref 70–164)
Glucose, GTT - 3 Hour: 86 mg/dL (ref 70–144)

## 2012-03-17 ENCOUNTER — Other Ambulatory Visit: Payer: Self-pay | Admitting: Obstetrics and Gynecology

## 2012-03-17 DIAGNOSIS — O280 Abnormal hematological finding on antenatal screening of mother: Secondary | ICD-10-CM

## 2012-03-21 ENCOUNTER — Ambulatory Visit (HOSPITAL_COMMUNITY)
Admission: RE | Admit: 2012-03-21 | Discharge: 2012-03-21 | Disposition: A | Discharge status: Home / Self Care | Payer: Self-pay | Source: Ambulatory Visit | Attending: Obstetrics and Gynecology | Admitting: Obstetrics and Gynecology

## 2012-03-21 VITALS — BP 103/64 | HR 65 | Wt 129.0 lb

## 2012-03-21 DIAGNOSIS — Z8751 Personal history of pre-term labor: Secondary | ICD-10-CM | POA: Insufficient documentation

## 2012-03-21 DIAGNOSIS — O09899 Supervision of other high risk pregnancies, unspecified trimester: Secondary | ICD-10-CM

## 2012-03-21 DIAGNOSIS — O280 Abnormal hematological finding on antenatal screening of mother: Secondary | ICD-10-CM

## 2012-03-21 DIAGNOSIS — O368990 Maternal care for other specified fetal problems, unspecified trimester, not applicable or unspecified: Secondary | ICD-10-CM

## 2012-03-21 DIAGNOSIS — O3500X0 Maternal care for (suspected) central nervous system malformation or damage in fetus, unspecified, not applicable or unspecified: Secondary | ICD-10-CM | POA: Insufficient documentation

## 2012-03-21 DIAGNOSIS — O350XX0 Maternal care for (suspected) central nervous system malformation in fetus, not applicable or unspecified: Secondary | ICD-10-CM | POA: Insufficient documentation

## 2012-03-21 DIAGNOSIS — O0990 Supervision of high risk pregnancy, unspecified, unspecified trimester: Secondary | ICD-10-CM

## 2012-03-23 ENCOUNTER — Ambulatory Visit (INDEPENDENT_AMBULATORY_CARE_PROVIDER_SITE_OTHER): Payer: Self-pay | Admitting: Family Medicine

## 2012-03-23 VITALS — BP 111/74 | Temp 96.8°F | Wt 127.9 lb

## 2012-03-23 DIAGNOSIS — O289 Unspecified abnormal findings on antenatal screening of mother: Secondary | ICD-10-CM

## 2012-03-23 DIAGNOSIS — O43899 Other placental disorders, unspecified trimester: Secondary | ICD-10-CM

## 2012-03-23 DIAGNOSIS — O368990 Maternal care for other specified fetal problems, unspecified trimester, not applicable or unspecified: Secondary | ICD-10-CM

## 2012-03-23 DIAGNOSIS — O0990 Supervision of high risk pregnancy, unspecified, unspecified trimester: Secondary | ICD-10-CM

## 2012-03-23 DIAGNOSIS — O280 Abnormal hematological finding on antenatal screening of mother: Secondary | ICD-10-CM

## 2012-03-23 DIAGNOSIS — O09219 Supervision of pregnancy with history of pre-term labor, unspecified trimester: Secondary | ICD-10-CM

## 2012-03-23 DIAGNOSIS — O09899 Supervision of other high risk pregnancies, unspecified trimester: Secondary | ICD-10-CM

## 2012-03-23 LAB — POCT URINALYSIS DIP (DEVICE)
Bilirubin Urine: NEGATIVE
Glucose, UA: NEGATIVE mg/dL
Ketones, ur: NEGATIVE mg/dL

## 2012-03-23 NOTE — Progress Notes (Signed)
Pulse 77 Patient reports no complaints today

## 2012-03-23 NOTE — Progress Notes (Signed)
Hx preterm delivery 34 weeks with PPROM. Declined 17-P.No contractions, no LOF, no bleeding. Baby moving.  Marginal/velamentous cord insertion. Has F/U sono on 12/10. Discussed with MFM today - placenta is wrapped around (anterior/posterior) but is well away from cervical os, no sign of vasa previa. Okay to proceed with vaginal delivery. Preterm labor precautions reviewed.

## 2012-03-23 NOTE — Patient Instructions (Signed)
Pregnancy - Third Trimester  The third trimester of pregnancy (the last 3 months) is a period of the most rapid growth for you and your baby. The baby approaches a length of 20 inches and a weight of 6 to 10 pounds. The baby is adding on fat and getting ready for life outside your body. While inside, babies have periods of sleeping and waking, suck their thumbs, and hiccups. You can often feel small contractions of the uterus. This is false labor. It is also called Braxton-Hicks contractions. This is like a practice for labor. The usual problems in this stage of pregnancy include more difficulty breathing, swelling of the hands and feet from water retention, and having to urinate more often because of the uterus and baby pressing on your bladder.   PRENATAL EXAMS  · Blood work may continue to be done during prenatal exams. These tests are done to check on your health and the probable health of your baby. Blood work is used to follow your blood levels (hemoglobin). Anemia (low hemoglobin) is common during pregnancy. Iron and vitamins are given to help prevent this. You may also continue to be checked for diabetes. Some of the past blood tests may be done again.  · The size of the uterus is measured during each visit. This makes sure your baby is growing properly according to your pregnancy dates.  · Your blood pressure is checked every prenatal visit. This is to make sure you are not getting toxemia.  · Your urine is checked every prenatal visit for infection, diabetes and protein.  · Your weight is checked at each visit. This is done to make sure gains are happening at the suggested rate and that you and your baby are growing normally.  · Sometimes, an ultrasound is performed to confirm the position and the proper growth and development of the baby. This is a test done that bounces harmless sound waves off the baby so your caregiver can more accurately determine due dates.  · Discuss the type of pain medication and  anesthesia you will have during your labor and delivery.  · Discuss the possibility and anesthesia if a Cesarean Section might be necessary.  · Inform your caregiver if there is any mental or physical violence at home.  Sometimes, a specialized non-stress test, contraction stress test and biophysical profile are done to make sure the baby is not having a problem. Checking the amniotic fluid surrounding the baby is called an amniocentesis. The amniotic fluid is removed by sticking a needle into the belly (abdomen). This is sometimes done near the end of pregnancy if an early delivery is required. In this case, it is done to help make sure the baby's lungs are mature enough for the baby to live outside of the womb. If the lungs are not mature and it is unsafe to deliver the baby, an injection of cortisone medication is given to the mother 1 to 2 days before the delivery. This helps the baby's lungs mature and makes it safer to deliver the baby.  CHANGES OCCURING IN THE THIRD TRIMESTER OF PREGNANCY  Your body goes through many changes during pregnancy. They vary from person to person. Talk to your caregiver about changes you notice and are concerned about.  · During the last trimester, you have probably had an increase in your appetite. It is normal to have cravings for certain foods. This varies from person to person and pregnancy to pregnancy.  · You may begin to   get stretch marks on your hips, abdomen, and breasts. These are normal changes in the body during pregnancy. There are no exercises or medications to take which prevent this change.  · Constipation may be treated with a stool softener or adding bulk to your diet. Drinking lots of fluids, fiber in vegetables, fruits, and whole grains are helpful.  · Exercising is also helpful. If you have been very active up until your pregnancy, most of these activities can be continued during your pregnancy. If you have been less active, it is helpful to start an exercise  program such as walking. Consult your caregiver before starting exercise programs.  · Avoid all smoking, alcohol, un-prescribed drugs, herbs and "street drugs" during your pregnancy. These chemicals affect the formation and growth of the baby. Avoid chemicals throughout the pregnancy to ensure the delivery of a healthy infant.  · Backache, varicose veins and hemorrhoids may develop or get worse.  · You will tire more easily in the third trimester, which is normal.  · The baby's movements may be stronger and more often.  · You may become short of breath easily.  · Your belly button may stick out.  · A yellow discharge may leak from your breasts called colostrum.  · You may have a bloody mucus discharge. This usually occurs a few days to a week before labor begins.  HOME CARE INSTRUCTIONS   · Keep your caregiver's appointments. Follow your caregiver's instructions regarding medication use, exercise, and diet.  · During pregnancy, you are providing food for you and your baby. Continue to eat regular, well-balanced meals. Choose foods such as meat, fish, milk and other low fat dairy products, vegetables, fruits, and whole-grain breads and cereals. Your caregiver will tell you of the ideal weight gain.  · A physical sexual relationship may be continued throughout pregnancy if there are no other problems such as early (premature) leaking of amniotic fluid from the membranes, vaginal bleeding, or belly (abdominal) pain.  · Exercise regularly if there are no restrictions. Check with your caregiver if you are unsure of the safety of your exercises. Greater weight gain will occur in the last 2 trimesters of pregnancy. Exercising helps:  · Control your weight.  · Get you in shape for labor and delivery.  · You lose weight after you deliver.  · Rest a lot with legs elevated, or as needed for leg cramps or low back pain.  · Wear a good support or jogging bra for breast tenderness during pregnancy. This may help if worn during  sleep. Pads or tissues may be used in the bra if you are leaking colostrum.  · Do not use hot tubs, steam rooms, or saunas.  · Wear your seat belt when driving. This protects you and your baby if you are in an accident.  · Avoid raw meat, cat litter boxes and soil used by cats. These carry germs that can cause birth defects in the baby.  · It is easier to loose urine during pregnancy. Tightening up and strengthening the pelvic muscles will help with this problem. You can practice stopping your urination while you are going to the bathroom. These are the same muscles you need to strengthen. It is also the muscles you would use if you were trying to stop from passing gas. You can practice tightening these muscles up 10 times a set and repeating this about 3 times per day. Once you know what muscles to tighten up, do not perform these   exercises during urination. It is more likely to cause an infection by backing up the urine.  · Ask for help if you have financial, counseling or nutritional needs during pregnancy. Your caregiver will be able to offer counseling for these needs as well as refer you for other special needs.  · Make a list of emergency phone numbers and have them available.  · Plan on getting help from family or friends when you go home from the hospital.  · Make a trial run to the hospital.  · Take prenatal classes with the father to understand, practice and ask questions about the labor and delivery.  · Prepare the baby's room/nursery.  · Do not travel out of the city unless it is absolutely necessary and with the advice of your caregiver.  · Wear only low or no heal shoes to have better balance and prevent falling.  MEDICATIONS AND DRUG USE IN PREGNANCY  · Take prenatal vitamins as directed. The vitamin should contain 1 milligram of folic acid. Keep all vitamins out of reach of children. Only a couple vitamins or tablets containing iron may be fatal to a baby or young child when ingested.  · Avoid use  of all medications, including herbs, over-the-counter medications, not prescribed or suggested by your caregiver. Only take over-the-counter or prescription medicines for pain, discomfort, or fever as directed by your caregiver. Do not use aspirin, ibuprofen (Motrin®, Advil®, Nuprin®) or naproxen (Aleve®) unless OK'd by your caregiver.  · Let your caregiver also know about herbs you may be using.  · Alcohol is related to a number of birth defects. This includes fetal alcohol syndrome. All alcohol, in any form, should be avoided completely. Smoking will cause low birth rate and premature babies.  · Street/illegal drugs are very harmful to the baby. They are absolutely forbidden. A baby born to an addicted mother will be addicted at birth. The baby will go through the same withdrawal an adult does.  SEEK MEDICAL CARE IF:  You have any concerns or worries during your pregnancy. It is better to call with your questions if you feel they cannot wait, rather than worry about them.  DECISIONS ABOUT CIRCUMCISION  You may or may not know the sex of your baby. If you know your baby is a boy, it may be time to think about circumcision. Circumcision is the removal of the foreskin of the penis. This is the skin that covers the sensitive end of the penis. There is no proven medical need for this. Often this decision is made on what is popular at the time or based upon religious beliefs and social issues. You can discuss these issues with your caregiver or pediatrician.  SEEK IMMEDIATE MEDICAL CARE IF:   · An unexplained oral temperature above 102° F (38.9° C) develops, or as your caregiver suggests.  · You have leaking of fluid from the vagina (birth canal). If leaking membranes are suspected, take your temperature and tell your caregiver of this when you call.  · There is vaginal spotting, bleeding or passing clots. Tell your caregiver of the amount and how many pads are used.  · You develop a bad smelling vaginal discharge with  a change in the color from clear to white.  · You develop vomiting that lasts more than 24 hours.  · You develop chills or fever.  · You develop shortness of breath.  · You develop burning on urination.  · You loose more than 2 pounds of weight   or gain more than 2 pounds of weight or as suggested by your caregiver.  · You notice sudden swelling of your face, hands, and feet or legs.  · You develop belly (abdominal) pain. Round ligament discomfort is a common non-cancerous (benign) cause of abdominal pain in pregnancy. Your caregiver still must evaluate you.  · You develop a severe headache that does not go away.  · You develop visual problems, blurred or double vision.  · If you have not felt your baby move for more than 1 hour. If you think the baby is not moving as much as usual, eat something with sugar in it and lie down on your left side for an hour. The baby should move at least 4 to 5 times per hour. Call right away if your baby moves less than that.  · You fall, are in a car accident or any kind of trauma.  · There is mental or physical violence at home.  Document Released: 04/20/2001 Document Revised: 07/19/2011 Document Reviewed: 10/23/2008  [employer]® Patient Information ©2013 [employer].

## 2012-04-01 ENCOUNTER — Inpatient Hospital Stay (HOSPITAL_COMMUNITY)
Admission: AD | Admit: 2012-04-01 | Discharge: 2012-04-08 | DRG: 765 | Disposition: A | Discharge status: Home / Self Care | Payer: Medicaid Other | Source: Ambulatory Visit | Attending: Obstetrics & Gynecology | Admitting: Obstetrics & Gynecology

## 2012-04-01 ENCOUNTER — Encounter (HOSPITAL_COMMUNITY): Payer: Self-pay

## 2012-04-01 ENCOUNTER — Inpatient Hospital Stay (HOSPITAL_COMMUNITY): Payer: Medicaid Other

## 2012-04-01 DIAGNOSIS — O321XX0 Maternal care for breech presentation, not applicable or unspecified: Principal | ICD-10-CM | POA: Diagnosis present

## 2012-04-01 DIAGNOSIS — IMO0002 Reserved for concepts with insufficient information to code with codable children: Secondary | ICD-10-CM

## 2012-04-01 DIAGNOSIS — O43899 Other placental disorders, unspecified trimester: Secondary | ICD-10-CM

## 2012-04-01 DIAGNOSIS — O42919 Preterm premature rupture of membranes, unspecified as to length of time between rupture and onset of labor, unspecified trimester: Secondary | ICD-10-CM

## 2012-04-01 DIAGNOSIS — O280 Abnormal hematological finding on antenatal screening of mother: Secondary | ICD-10-CM

## 2012-04-01 DIAGNOSIS — Z2839 Other underimmunization status: Secondary | ICD-10-CM

## 2012-04-01 DIAGNOSIS — O09899 Supervision of other high risk pregnancies, unspecified trimester: Secondary | ICD-10-CM

## 2012-04-01 DIAGNOSIS — O368990 Maternal care for other specified fetal problems, unspecified trimester, not applicable or unspecified: Secondary | ICD-10-CM

## 2012-04-01 DIAGNOSIS — O4290 Premature rupture of membranes, unspecified as to length of time between rupture and onset of labor, unspecified weeks of gestation: Secondary | ICD-10-CM | POA: Diagnosis present

## 2012-04-01 DIAGNOSIS — O0990 Supervision of high risk pregnancy, unspecified, unspecified trimester: Secondary | ICD-10-CM

## 2012-04-01 LAB — AMNISURE RUPTURE OF MEMBRANE (ROM) NOT AT ARMC: Amnisure ROM: POSITIVE

## 2012-04-01 LAB — GROUP B STREP BY PCR: Group B strep by PCR: NEGATIVE

## 2012-04-01 LAB — CBC
MCHC: 34.6 g/dL (ref 30.0–36.0)
Platelets: 216 10*3/uL (ref 150–400)
RDW: 13.4 % (ref 11.5–15.5)

## 2012-04-01 LAB — TYPE AND SCREEN: ABO/RH(D): O POS

## 2012-04-01 LAB — RPR: RPR Ser Ql: NONREACTIVE

## 2012-04-01 MED ORDER — ZOLPIDEM TARTRATE 5 MG PO TABS: 5.0000 mg | ORAL_TABLET | Freq: Every evening | ORAL | Status: DC | PRN

## 2012-04-01 MED ORDER — ERYTHROMYCIN LACTOBIONATE 500 MG IV SOLR
250.0000 mg | Freq: Four times a day (QID) | INTRAVENOUS | Status: AC
  Administered 2012-04-01 – 2012-04-02 (×8): 250 mg via INTRAVENOUS
  Filled 2012-04-01 (×8): qty 250

## 2012-04-01 MED ORDER — CALCIUM CARBONATE ANTACID 500 MG PO CHEW: 2.0000 | CHEWABLE_TABLET | ORAL | Status: DC | PRN

## 2012-04-01 MED ORDER — PRENATAL MULTIVITAMIN CH
1.0000 | ORAL_TABLET | Freq: Every day | ORAL | Status: DC
  Administered 2012-04-01 – 2012-04-04 (×4): 1 via ORAL
  Filled 2012-04-01 (×5): qty 1

## 2012-04-01 MED ORDER — BETAMETHASONE SOD PHOS & ACET 6 (3-3) MG/ML IJ SUSP
12.0000 mg | INTRAMUSCULAR | Status: AC
  Administered 2012-04-01 – 2012-04-02 (×2): 12 mg via INTRAMUSCULAR
  Filled 2012-04-01 (×2): qty 2

## 2012-04-01 MED ORDER — ERYTHROMYCIN BASE 250 MG PO TABS
250.0000 mg | ORAL_TABLET | Freq: Four times a day (QID) | ORAL | Status: DC
  Administered 2012-04-03 – 2012-04-04 (×8): 250 mg via ORAL
  Filled 2012-04-01 (×10): qty 1

## 2012-04-01 MED ORDER — MAGNESIUM SULFATE 40 G IN LACTATED RINGERS - SIMPLE
2.0000 g/h | INTRAVENOUS | Status: DC
  Filled 2012-04-01 (×2): qty 500

## 2012-04-01 MED ORDER — SODIUM CHLORIDE 0.9 % IV SOLN
2.0000 g | Freq: Four times a day (QID) | INTRAVENOUS | Status: AC
  Administered 2012-04-01 – 2012-04-02 (×8): 2 g via INTRAVENOUS
  Filled 2012-04-01 (×8): qty 2000

## 2012-04-01 MED ORDER — ACETAMINOPHEN 325 MG PO TABS: 650.0000 mg | ORAL_TABLET | ORAL | Status: DC | PRN

## 2012-04-01 MED ORDER — DOCUSATE SODIUM 100 MG PO CAPS
100.0000 mg | ORAL_CAPSULE | Freq: Every day | ORAL | Status: DC
  Administered 2012-04-01 – 2012-04-07 (×7): 100 mg via ORAL
  Filled 2012-04-01 (×7): qty 1

## 2012-04-01 MED ORDER — LACTATED RINGERS IV SOLN
INTRAVENOUS | Status: DC
  Administered 2012-04-01 (×2): via INTRAVENOUS
  Administered 2012-04-01: 20 mL/h via INTRAVENOUS
  Administered 2012-04-02: 05:00:00 via INTRAVENOUS

## 2012-04-01 MED ORDER — MAGNESIUM SULFATE BOLUS VIA INFUSION
4.0000 g | Freq: Once | INTRAVENOUS | Status: AC
  Administered 2012-04-01: 4 g via INTRAVENOUS
  Filled 2012-04-01: qty 500

## 2012-04-01 MED ORDER — AMOXICILLIN 500 MG PO CAPS
500.0000 mg | ORAL_CAPSULE | Freq: Three times a day (TID) | ORAL | Status: DC
  Administered 2012-04-03 – 2012-04-04 (×6): 500 mg via ORAL
  Filled 2012-04-01 (×8): qty 1

## 2012-04-01 NOTE — Progress Notes (Signed)
Spanish interpreter called to the bedside to order patient's dinner.

## 2012-04-01 NOTE — H&P (Signed)
   First Provider Initiated Contact with Patient 04/01/12 [phone]      Chief Complaint  Patient presents with  . Rupture of Membranes   HPI  Alyssa Wallace is a 26 y.o. G2P0101 who presents today with possible ROM. She states that around 0200 she was laying in bed and felt a gush of warm fluid, and then has continued to leak since then. Her 1st baby was born at 70 weeks at [facility].   Past Medical History  Diagnosis Date  . Preterm labor     Past Surgical History  Procedure Date  . No past surgeries     Family History  Problem Relation Age of Onset  . Diabetes Mother     History  Substance Use Topics  . Smoking status: Never Smoker   . Smokeless tobacco: Never Used  . Alcohol Use: No    Allergies: No Known Allergies  Prescriptions prior to admission  Medication Sig Dispense Refill  . Prenatal Vit-Fe Fumarate-FA (MULTIVITAMIN-PRENATAL) 27-0.8 MG TABS Take 1 tablet by mouth daily.      Marland Kitchen acetaminophen (TYLENOL) 500 MG tablet Take 500 mg by mouth every 6 (six) hours as needed.        Review of Systems  Constitutional: Negative for fever and chills.  Gastrointestinal: Negative for nausea, vomiting, abdominal pain and diarrhea.  Genitourinary: Negative for dysuria, urgency and frequency.   Physical Exam   Blood pressure 114/71, pulse 82, temperature 98.2 F (36.8 C), temperature source Oral, resp. rate 18, height 4' 11.25" (1.505 m), weight 59.693 kg (131 lb 9.6 oz), last menstrual period 08/13/2011.  Physical Exam  Nursing note and vitals reviewed. Constitutional: She is oriented to person, place, and time. She appears well-developed and well-nourished.  Cardiovascular: Normal rate.   Respiratory: Effort normal.  GI: Soft. She exhibits no distension. There is no tenderness.  Genitourinary:        External: perineum moist Vagina: pooling of fluid in vault Cervix: thick, appears closed-1cm? Uterus: AGA  Neurological: She is alert and oriented to person,  place, and time.  Skin: Skin is warm and dry.    MAU Course  Procedures  Results for orders placed during the hospital encounter of 04/01/12 (from the past 24 hour(s))  AMNISURE RUPTURE OF MEMBRANE (ROM)     Status: Normal   Collection Time   04/01/12  3:20 AM      Component Value Range   Amnisure ROM POSITIVE     Assessment and Plan   PPROM Admit to antenatal Antibiotics Steriods Magnesium Expectant management, anticipate NSVD.  Tawnya Crook 04/01/2012, 3:06 AM

## 2012-04-01 NOTE — H&P (Signed)
Attestation of Attending Supervision of Advanced Practitioner (CNM/NP): Evaluation and management procedures were performed by the Advanced Practitioner under my supervision and collaboration.  I have reviewed the Advanced Practitioner's note and chart, and I agree with the management and plan.  03/21/12 OB Detailed ultrasound at [redacted]w[redacted]d shows EFW 1642g (40%), frank breech, AFV normal, velamentous/marginal cord insertion.  OB Limited ordered today for AFI, presentation and placental evaluation.    Will continue routine antenatal care.  Jaynie Collins, MD, FACOG Attending Obstetrician & Gynecologist Faculty Practice, [facility]

## 2012-04-01 NOTE — MAU Note (Signed)
Large gush of clear fluid at 0200 am this morning. States has been leaking a lot of water since then. Denies contractions/vaginal bleeding/pain.

## 2012-04-01 NOTE — MAU Provider Note (Signed)
  History     CSN: [id number]  Arrival date and time: 04/01/12 8295   First Provider Initiated Contact with Patient 04/01/12 0253      Chief Complaint  Patient presents with  . Rupture of Membranes   HPI  Alyssa Wallace is a 26 y.o. G2P0101 who presents today with possible ROM. She states that around 0200 she was laying in bed and felt a gush of warm fluid, and then has continued to leak since then. Her 1st baby was born at 1 weeks at [facility].   Past Medical History  Diagnosis Date  . Preterm labor     Past Surgical History  Procedure Date  . No past surgeries     Family History  Problem Relation Age of Onset  . Diabetes Mother     History  Substance Use Topics  . Smoking status: Never Smoker   . Smokeless tobacco: Never Used  . Alcohol Use: No    Allergies: No Known Allergies  Prescriptions prior to admission  Medication Sig Dispense Refill  . Prenatal Vit-Fe Fumarate-FA (MULTIVITAMIN-PRENATAL) 27-0.8 MG TABS Take 1 tablet by mouth daily.      Marland Kitchen acetaminophen (TYLENOL) 500 MG tablet Take 500 mg by mouth every 6 (six) hours as needed.        Review of Systems  Constitutional: Negative for fever and chills.  Gastrointestinal: Negative for nausea, vomiting, abdominal pain and diarrhea.  Genitourinary: Negative for dysuria, urgency and frequency.   Physical Exam   Blood pressure 114/71, pulse 82, temperature 98.2 F (36.8 C), temperature source Oral, resp. rate 18, height 4' 11.25" (1.505 m), weight 59.693 kg (131 lb 9.6 oz), last menstrual period 08/13/2011.  Physical Exam  Nursing note and vitals reviewed. Constitutional: She is oriented to person, place, and time. She appears well-developed and well-nourished.  Cardiovascular: Normal rate.   Respiratory: Effort normal.  GI: Soft. She exhibits no distension. There is no tenderness.  Genitourinary:        External: perineum moist Vagina: pooling of fluid in vault Cervix: thick, appears  closed-1cm? Uterus: AGA  Neurological: She is alert and oriented to person, place, and time.  Skin: Skin is warm and dry.    MAU Course  Procedures  Results for orders placed during the hospital encounter of 04/01/12 (from the past 24 hour(s))  AMNISURE RUPTURE OF MEMBRANE (ROM)     Status: Normal   Collection Time   04/01/12  3:20 AM      Component Value Range   Amnisure ROM POSITIVE     Assessment and Plan   PPROM Admit to antenatal Antibiotics Steriods Magnesium Expectant management, anticipate NSVD.  Tawnya Crook 04/01/2012, 3:06 AM

## 2012-04-02 NOTE — Progress Notes (Signed)
Interpreter [place] called to patient's room. Pt. Spoke with interpreter in reference to "can I have a/some women come to the hospital and turn my baby".  Per patient, baby is in breech position.  With interpreter at the bedside, RN informed pt. (& spouse) that this is a maneuver that needs to be performed by her doctor. This procedure is referred to as a version and she should discuss it with her doctor. Informed pt. That she is only 33 weeks and the baby still has time to change positions. Informed pt. - this procedure is one that should be performed by her MD and in a hospital setting in case your baby goes into fetal distress.  The patient is of Hispanic origin and this may be a culture event per interpreter. Pt. & spouse verbalized understanding.

## 2012-04-02 NOTE — Progress Notes (Signed)
Per U/S report as of 04/01/12, baby is in Goodman breech position.

## 2012-04-02 NOTE — Progress Notes (Signed)
Patient ID: Alyssa Wallace, female   DOB: [birth year], 26 y.o.   MRN: [id number] FACULTY PRACTICE ANTEPARTUM(COMPREHENSIVE) NOTE  Alyssa Wallace is a 26 y.o. G2P0101 at [redacted]w[redacted]d by midtrimester ultrasound who is admitted for PROM.   Fetal presentation is breech. Length of Stay:  1  Days  Subjective: No contractions Patient reports the fetal movement as active. Patient reports uterine contraction  activity as none. Patient reports  vaginal bleeding as none. Patient describes fluid per vagina as Clear.  Vitals:  Blood pressure 100/62, pulse 90, temperature 98.3 F (36.8 C), temperature source Oral, resp. rate 16, height 4' 11.25" (1.505 m), weight 131 lb 9.6 oz (59.693 kg), last menstrual period 08/13/2011. Physical Examination:  General appearance - alert, well appearing, and in no distress Heart - normal rate and regular rhythm Abdomen - soft, nontender, nondistended Fundal Height:  size equals dates Cervical Exam: Not evaluated. and found to be / / and fetal presentation is breech. Extremities: extremities normal, atraumatic, no cyanosis or edema and Homans sign is negative, no sign of DVT with DTRs 2+ bilaterally Membranes:ruptured  Fetal Monitoring:  Baseline: 145 bpm, Variability: Good {> 6 bpm) and Accelerations: Reactive  Labs:  No results found for this or any previous visit (from the past 24 hour(s)).  Imaging Studies:    *RADIOLOGY REPORT*  Clinical Data: Premature rupture of membranes  LIMITED OBSTETRIC ULTRASOUND  Number of Fetuses: 1  Heart Rate: 132 bpm  Movement: Present  Presentation: Breech  Placental Location: anterior and posterior  Previa: None  Amniotic Fluid (Subjective): Decreased secondary to Premature  ruptured membranes.  Vertical pocket: not measured AFI: 1.3 cm (5%ile 8.3 cm,  95%ile 24.5 cm)  BPD: 8.4cm 33w sixd  MATERNAL FINDINGS:  Cervix: Closed measuring 3.3 cm  Uterus/Adnexae: Right ovary visualized.  IMPRESSION:  1. Very low  volume amniotic fluid secondary to premature rupture  of membranes.  2. Breech presentation.  3. Cervix closed.  Recommend followup with non-emergent complete OB 14+ wk US  examination for fetal biometric evaluation and anatomic survey if  not already performed.  This was made a call report.  Original Report Authenticated By: Genevive Bi, M.D.   Medications:  Scheduled    . ampicillin (OMNIPEN) IV  2 g Intravenous Q6H   Followed by  . amoxicillin  500 mg Oral Q8H  . [COMPLETED] betamethasone acetate-betamethasone sodium phosphate  12 mg Intramuscular Q24H  . docusate sodium  100 mg Oral Daily  . erythromycin  250 mg Intravenous Q6H   Followed by  . erythromycin  250 mg Oral Q6H  . prenatal multivitamin  1 tablet Oral Daily   I have reviewed the patient's current medications.  ASSESSMENT: Patient Active Problem List  Diagnosis  . History of preterm delivery, currently pregnant  . Supervision of high-risk pregnancy  . Other specified fetal and placental problems affecting management of mother, antepartum  . Susceptible to varicella (non-immune), currently pregnant  . Abnormal quad screen  . Premature rupture of membranes in pregnancy, antepartum    PLAN: Magnesium discontinued. Counseled that cesarean section recommended due to breech presentation.  ARNOLD,JAMES 04/02/2012,6:50 AM

## 2012-04-03 DIAGNOSIS — O321XX0 Maternal care for breech presentation, not applicable or unspecified: Principal | ICD-10-CM

## 2012-04-03 DIAGNOSIS — O4290 Premature rupture of membranes, unspecified as to length of time between rupture and onset of labor, unspecified weeks of gestation: Secondary | ICD-10-CM

## 2012-04-03 NOTE — Progress Notes (Signed)
Patient ID: Alyssa Wallace, female   DOB: 12-28-1985, 26 y.o.   MRN: [id number] Alyssa Wallace is a 26 y.o. G2P0101 at [redacted]w[redacted]d by midtrimester ultrasound who is admitted for PROM.  Fetal presentation is breech.  Length of Stay: 2 Days  Subjective:  No contractions  Patient reports the fetal movement as active.  Patient reports uterine contraction activity as none.  Patient reports vaginal bleeding as none.  Patient describes fluid per vagina as Clear.  Vitals: Blood pressure 100/62, pulse 90, temperature 98.3 F (36.8 C), temperature source Oral, resp. rate 16, height 4' 11.25" (1.505 m), weight 131 lb 9.6 oz (59.693 kg), last menstrual period 08/13/2011.  Physical Examination:  General appearance - alert, well appearing, and in no distress  Heart - normal rate and regular rhythm  Abdomen - soft, nontender, nondistended  Fundal Height: size equals dates  Cervical Exam: Not evaluated. and found to be / / and fetal presentation is breech.  Extremities: extremities normal, atraumatic, no cyanosis or edema and Homans sign is negative, no sign of DVT with DTRs 2+ bilaterally  Membranes:ruptured  Fetal Monitoring: Baseline: 145 bpm, Variability: Good {> 6 bpm) and Accelerations: Reactive  Labs:  No results found for this or any previous visit (from the past 24 hour(s)).  Imaging Studies:  *RADIOLOGY REPORT*  Clinical Data: Premature rupture of membranes  LIMITED OBSTETRIC ULTRASOUND  Number of Fetuses: 1  Heart Rate: 132 bpm  Movement: Present  Presentation: Breech  Placental Location: anterior and posterior  Previa: None  Amniotic Fluid (Subjective): Decreased secondary to Premature  ruptured membranes.  Vertical pocket: not measured AFI: 1.3 cm (5%ile 8.3 cm,  95%ile 24.5 cm)  BPD: 8.4cm 33w sixd  MATERNAL FINDINGS:  Cervix: Closed measuring 3.3 cm  Uterus/Adnexae: Right ovary visualized.  IMPRESSION:  1. Very low volume amniotic fluid secondary to premature rupture   of membranes.  2. Breech presentation.  3. Cervix closed.  Recommend followup with non-emergent complete OB 14+ wk US  examination for fetal biometric evaluation and anatomic survey if  not already performed.  This was made a call report.  Original Report Authenticated By: Genevive Bi, M.D.  Medications: Scheduled  .  ampicillin (OMNIPEN) IV  2 g  Intravenous  Q6H    Followed by   .  amoxicillin  500 mg  Oral  Q8H   .  [COMPLETED] betamethasone acetate-betamethasone sodium phosphate  12 mg  Intramuscular  Q24H   .  docusate sodium  100 mg  Oral  Daily   .  erythromycin  250 mg  Intravenous  Q6H    Followed by   .  erythromycin  250 mg  Oral  Q6H   .  prenatal multivitamin  1 tablet  Oral  Daily   I have reviewed the patient's current medications.  ASSESSMENT:  Patient Active Problem List   Diagnosis   .  History of preterm delivery, currently pregnant   .  Supervision of high-risk pregnancy   .  Other specified fetal and placental problems affecting management of mother, antepartum   .  Susceptible to varicella (non-immune), currently pregnant   .  Abnormal quad screen   .  Premature rupture of membranes in pregnancy, antepartum   PLAN:  Magnesium discontinued. Counseled that cesarean section recommended due to breech presentation.

## 2012-04-03 NOTE — MAU Provider Note (Signed)
Attestation of Attending Supervision of Advanced Practitioner (CNM/NP): Evaluation and management procedures were performed by the Advanced Practitioner under my supervision and collaboration.  I have reviewed the Advanced Practitioner's note and chart, and I agree with the management and plan.  Larysa Pall, MD, FACOG Attending Obstetrician & Gynecologist Faculty Practice, [facility]  

## 2012-04-03 NOTE — Progress Notes (Signed)
Ur chart review completed.  

## 2012-04-04 LAB — CBC
HCT: 36.2 % (ref 36.0–46.0)
Hemoglobin: 12.4 g/dL (ref 12.0–15.0)
RDW: 13.6 % (ref 11.5–15.5)
WBC: 8 10*3/uL (ref 4.0–10.5)

## 2012-04-04 LAB — TYPE AND SCREEN: Antibody Screen: NEGATIVE

## 2012-04-04 MED ORDER — SODIUM CHLORIDE 0.9 % IJ SOLN
3.0000 mL | Freq: Two times a day (BID) | INTRAMUSCULAR | Status: DC
  Administered 2012-04-04 (×2): 3 mL via INTRAVENOUS

## 2012-04-04 NOTE — Progress Notes (Signed)
Patient ID: Alyssa Wallace, female   DOB: 1986/03/10, 26 y.o.   MRN: [id number] Alyssa Wallace is a 26 y.o. G2P0101 at [redacted]w[redacted]d by midtrimester ultrasound who is admitted for PPROM.  Fetal presentation is frank breech.  Length of Stay: 2 Days  Subjective:  No contractions  Patient reports the fetal movement as active.  Patient reports uterine contraction activity as none.  Patient reports vaginal bleeding as none.  Patient describes fluid per vagina as Clear.   Vitals: Blood pressure 100/62, pulse 56, temperature 98.1 F (36.7 C), temperature source Oral, resp. rate 18, height 4' 11.25" (1.505 m), weight 131 lb 9.6 oz (59.693 kg), last menstrual period 08/13/2011. Physical Examination:  General appearance - alert, well appearing, and in no distress  Heart - normal rate and regular rhythm  Abdomen - soft, nontender, nondistended  Fundal Height: size equals dates  Cervical Exam: Not evaluated.  Extremities: extremities normal, atraumatic, no cyanosis or edema and Homans sign is negative, no sign of DVT with DTRs 2+ bilaterally  Membranes:ruptured  Fetal Monitoring: Baseline: 140 bpm, Variability: Moderate {> 6 bpm) and Accelerations: Reactive, no Decelerations.  Rare contractions Labs:  No results found for this or any previous visit (from the past 24 hour(s)).  Imaging Studies:   04/01/12 LIMITED OBSTETRIC ULTRASOUND  Clinical Data: Premature rupture of membranes  Number of Fetuses: 1  Heart Rate: 132 bpm  Movement: Present  Presentation: Breech  Placental Location: anterior and posterior  Previa: None  Amniotic Fluid (Subjective): Decreased secondary to Premature ruptured membranes.  Vertical pocket: not measured AFI: 1.3 cm (5%ile 8.3 cm, 95%ile 24.5 cm)  BPD: 8.4cm 33w sixd  MATERNAL FINDINGS: Cervix: Closed measuring 3.3 cm. Uterus/Adnexae: Right ovary visualized.  IMPRESSION:  1. Very low volume amniotic fluid secondary to premature rupture of membranes.  2.  Breech presentation.  3. Cervix closed.   03/21/12 OB Detailed Ultrasound at [redacted]w[redacted]d shows EFW 1642g (40%), frank breech, AFV normal, velamentous/marginal cord insertion.    Medications: Scheduled    . amoxicillin  500 mg Oral Q8H  . docusate sodium  100 mg Oral Daily  . erythromycin  250 mg Oral Q6H  . prenatal multivitamin  1 tablet Oral Daily  . sodium chloride  3 mL Intravenous Q12H   I have reviewed the patient's current medications.   ASSESSMENT:  Patient Active Problem List  Diagnosis  . History of preterm delivery, currently pregnant  . Supervision of high-risk pregnancy  . Other specified fetal and placental problems affecting management of mother, antepartum  . Susceptible to varicella (non-immune), currently pregnant  . Abnormal quad screen  . Premature rupture of membranes in pregnancy, antepartum    PLAN:  Received betamethasone x 2 doses, magnesium sulfate initially Continue latency antibiotics Counseled that cesarean section recommended due to breech presentation CBC and T&S ordered for today; will repeat in 3 days Routine antenatal care

## 2012-04-05 ENCOUNTER — Inpatient Hospital Stay (HOSPITAL_COMMUNITY): Payer: Medicaid Other | Admitting: Anesthesiology

## 2012-04-05 ENCOUNTER — Encounter (HOSPITAL_COMMUNITY)
Admission: AD | Disposition: A | Discharge status: Home / Self Care | Payer: Self-pay | Source: Ambulatory Visit | Attending: Obstetrics & Gynecology

## 2012-04-05 ENCOUNTER — Encounter (HOSPITAL_COMMUNITY): Payer: Self-pay | Admitting: Anesthesiology

## 2012-04-05 ENCOUNTER — Encounter (HOSPITAL_COMMUNITY): Payer: Self-pay | Admitting: *Deleted

## 2012-04-05 DIAGNOSIS — O321XX0 Maternal care for breech presentation, not applicable or unspecified: Secondary | ICD-10-CM

## 2012-04-05 DIAGNOSIS — O4290 Premature rupture of membranes, unspecified as to length of time between rupture and onset of labor, unspecified weeks of gestation: Secondary | ICD-10-CM

## 2012-04-05 LAB — CULTURE, BETA STREP (GROUP B ONLY)

## 2012-04-05 LAB — CBC
MCH: 33.5 pg (ref 26.0–34.0)
Platelets: 244 10*3/uL (ref 150–400)
RBC: 4.15 MIL/uL (ref 3.87–5.11)
WBC: 11.4 10*3/uL — ABNORMAL HIGH (ref 4.0–10.5)

## 2012-04-05 SURGERY — Surgical Case
Anesthesia: Spinal | Site: Abdomen | Wound class: Clean Contaminated

## 2012-04-05 MED ORDER — DIBUCAINE 1 % RE OINT: 1.0000 "application " | TOPICAL_OINTMENT | RECTAL | Status: DC | PRN

## 2012-04-05 MED ORDER — OXYTOCIN 40 UNITS IN LACTATED RINGERS INFUSION - SIMPLE MED: 62.5000 mL/h | INTRAVENOUS | Status: AC

## 2012-04-05 MED ORDER — BUTORPHANOL TARTRATE 1 MG/ML IJ SOLN: 1.0000 mg | Freq: Once | INTRAMUSCULAR | Status: DC

## 2012-04-05 MED ORDER — ACETAMINOPHEN 10 MG/ML IV SOLN
1000.0000 mg | Freq: Four times a day (QID) | INTRAVENOUS | Status: AC | PRN
  Filled 2012-04-05: qty 100

## 2012-04-05 MED ORDER — MORPHINE SULFATE (PF) 0.5 MG/ML IJ SOLN
INTRAMUSCULAR | Status: DC | PRN
  Administered 2012-04-05: .1 mg via INTRATHECAL

## 2012-04-05 MED ORDER — ONDANSETRON HCL 4 MG/2ML IJ SOLN: 4.0000 mg | INTRAMUSCULAR | Status: DC | PRN

## 2012-04-05 MED ORDER — IBUPROFEN 600 MG PO TABS
600.0000 mg | ORAL_TABLET | Freq: Four times a day (QID) | ORAL | Status: DC
  Administered 2012-04-05 – 2012-04-08 (×12): 600 mg via ORAL
  Filled 2012-04-05 (×12): qty 1

## 2012-04-05 MED ORDER — LACTATED RINGERS IV SOLN: INTRAVENOUS | Status: DC

## 2012-04-05 MED ORDER — DIPHENHYDRAMINE HCL 25 MG PO CAPS: 25.0000 mg | ORAL_CAPSULE | Freq: Four times a day (QID) | ORAL | Status: DC | PRN

## 2012-04-05 MED ORDER — EPHEDRINE 5 MG/ML INJ
INTRAVENOUS | Status: AC
  Filled 2012-04-05: qty 10

## 2012-04-05 MED ORDER — LANOLIN HYDROUS EX OINT: 1.0000 "application " | TOPICAL_OINTMENT | CUTANEOUS | Status: DC | PRN

## 2012-04-05 MED ORDER — MIDAZOLAM HCL 2 MG/2ML IJ SOLN: 0.5000 mg | Freq: Once | INTRAMUSCULAR | Status: DC | PRN

## 2012-04-05 MED ORDER — PRENATAL MULTIVITAMIN CH
1.0000 | ORAL_TABLET | Freq: Every day | ORAL | Status: DC
  Administered 2012-04-05 – 2012-04-07 (×3): 1 via ORAL
  Filled 2012-04-05 (×3): qty 1

## 2012-04-05 MED ORDER — BUTORPHANOL TARTRATE 1 MG/ML IJ SOLN
1.0000 mg | INTRAMUSCULAR | Status: DC | PRN
  Filled 2012-04-05: qty 1

## 2012-04-05 MED ORDER — NALOXONE HCL 1 MG/ML IJ SOLN
1.0000 ug/kg/h | INTRAVENOUS | Status: DC | PRN
  Filled 2012-04-05: qty 2

## 2012-04-05 MED ORDER — CITRIC ACID-SODIUM CITRATE 334-500 MG/5ML PO SOLN
ORAL | Status: AC
  Filled 2012-04-05: qty 15

## 2012-04-05 MED ORDER — SCOPOLAMINE 1 MG/3DAYS TD PT72
MEDICATED_PATCH | TRANSDERMAL | Status: AC
  Filled 2012-04-05: qty 1

## 2012-04-05 MED ORDER — LACTATED RINGERS IV SOLN
INTRAVENOUS | Status: DC | PRN
  Administered 2012-04-05: 03:00:00 via INTRAVENOUS

## 2012-04-05 MED ORDER — ONDANSETRON HCL 4 MG/2ML IJ SOLN: 4.0000 mg | Freq: Three times a day (TID) | INTRAMUSCULAR | Status: DC | PRN

## 2012-04-05 MED ORDER — LACTATED RINGERS IV SOLN
INTRAVENOUS | Status: DC | PRN
  Administered 2012-04-05 (×2): via INTRAVENOUS

## 2012-04-05 MED ORDER — ONDANSETRON HCL 4 MG PO TABS: 4.0000 mg | ORAL_TABLET | ORAL | Status: DC | PRN

## 2012-04-05 MED ORDER — SODIUM CHLORIDE 0.9 % IJ SOLN: 3.0000 mL | INTRAMUSCULAR | Status: DC | PRN

## 2012-04-05 MED ORDER — BUTORPHANOL TARTRATE 1 MG/ML IJ SOLN
INTRAMUSCULAR | Status: AC
  Administered 2012-04-05: 1 mg via INTRAVENOUS
  Filled 2012-04-05: qty 1

## 2012-04-05 MED ORDER — 0.9 % SODIUM CHLORIDE (POUR BTL) OPTIME
TOPICAL | Status: DC | PRN
  Administered 2012-04-05: 1000 mL

## 2012-04-05 MED ORDER — BUPIVACAINE IN DEXTROSE 0.75-8.25 % IT SOLN
INTRATHECAL | Status: DC | PRN
  Administered 2012-04-05: 1.4 mL via INTRATHECAL

## 2012-04-05 MED ORDER — MENTHOL 3 MG MT LOZG: 1.0000 | LOZENGE | OROMUCOSAL | Status: DC | PRN

## 2012-04-05 MED ORDER — SIMETHICONE 80 MG PO CHEW: 80.0000 mg | CHEWABLE_TABLET | ORAL | Status: DC | PRN

## 2012-04-05 MED ORDER — BUTORPHANOL TARTRATE 1 MG/ML IJ SOLN
2.0000 mg | INTRAMUSCULAR | Status: DC | PRN
  Administered 2012-04-05: 1 mg via INTRAVENOUS

## 2012-04-05 MED ORDER — DIPHENHYDRAMINE HCL 25 MG PO CAPS: 25.0000 mg | ORAL_CAPSULE | ORAL | Status: DC | PRN

## 2012-04-05 MED ORDER — DIPHENHYDRAMINE HCL 50 MG/ML IJ SOLN: 12.5000 mg | INTRAMUSCULAR | Status: DC | PRN

## 2012-04-05 MED ORDER — DIPHENHYDRAMINE HCL 50 MG/ML IJ SOLN: 25.0000 mg | INTRAMUSCULAR | Status: DC | PRN

## 2012-04-05 MED ORDER — PHENYLEPHRINE HCL 10 MG/ML IJ SOLN
INTRAMUSCULAR | Status: DC | PRN
  Administered 2012-04-05 (×2): 40 ug via INTRAVENOUS
  Administered 2012-04-05 (×4): 80 ug via INTRAVENOUS

## 2012-04-05 MED ORDER — KETOROLAC TROMETHAMINE 30 MG/ML IJ SOLN
INTRAMUSCULAR | Status: AC
  Filled 2012-04-05: qty 1

## 2012-04-05 MED ORDER — ZOLPIDEM TARTRATE 5 MG PO TABS: 5.0000 mg | ORAL_TABLET | Freq: Every evening | ORAL | Status: DC | PRN

## 2012-04-05 MED ORDER — SIMETHICONE 80 MG PO CHEW
80.0000 mg | CHEWABLE_TABLET | Freq: Three times a day (TID) | ORAL | Status: DC
  Administered 2012-04-05 – 2012-04-07 (×12): 80 mg via ORAL

## 2012-04-05 MED ORDER — METOCLOPRAMIDE HCL 5 MG/ML IJ SOLN: 10.0000 mg | Freq: Three times a day (TID) | INTRAMUSCULAR | Status: DC | PRN

## 2012-04-05 MED ORDER — SCOPOLAMINE 1 MG/3DAYS TD PT72
1.0000 | MEDICATED_PATCH | Freq: Once | TRANSDERMAL | Status: DC
  Administered 2012-04-05: 1.5 mg via TRANSDERMAL

## 2012-04-05 MED ORDER — OXYCODONE-ACETAMINOPHEN 5-325 MG PO TABS
1.0000 | ORAL_TABLET | ORAL | Status: DC | PRN
  Administered 2012-04-05 – 2012-04-06 (×2): 1 via ORAL
  Filled 2012-04-05 (×2): qty 1

## 2012-04-05 MED ORDER — MEPERIDINE HCL 25 MG/ML IJ SOLN: 6.2500 mg | INTRAMUSCULAR | Status: DC | PRN

## 2012-04-05 MED ORDER — KETOROLAC TROMETHAMINE 30 MG/ML IJ SOLN: 30.0000 mg | Freq: Four times a day (QID) | INTRAMUSCULAR | Status: AC | PRN

## 2012-04-05 MED ORDER — WITCH HAZEL-GLYCERIN EX PADS: 1.0000 "application " | MEDICATED_PAD | CUTANEOUS | Status: DC | PRN

## 2012-04-05 MED ORDER — EPHEDRINE SULFATE 50 MG/ML IJ SOLN
INTRAMUSCULAR | Status: DC | PRN
  Administered 2012-04-05: 10 mg via INTRAVENOUS

## 2012-04-05 MED ORDER — CEFAZOLIN SODIUM-DEXTROSE 2-3 GM-% IV SOLR
INTRAVENOUS | Status: AC
  Filled 2012-04-05: qty 50

## 2012-04-05 MED ORDER — NALBUPHINE HCL 10 MG/ML IJ SOLN
5.0000 mg | INTRAMUSCULAR | Status: DC | PRN
  Filled 2012-04-05: qty 1

## 2012-04-05 MED ORDER — SENNOSIDES-DOCUSATE SODIUM 8.6-50 MG PO TABS
2.0000 | ORAL_TABLET | Freq: Every day | ORAL | Status: DC
  Administered 2012-04-05 – 2012-04-07 (×3): 2 via ORAL

## 2012-04-05 MED ORDER — TETANUS-DIPHTH-ACELL PERTUSSIS 5-2.5-18.5 LF-MCG/0.5 IM SUSP
0.5000 mL | Freq: Once | INTRAMUSCULAR | Status: AC
  Administered 2012-04-06: 0.5 mL via INTRAMUSCULAR
  Filled 2012-04-05: qty 0.5

## 2012-04-05 MED ORDER — ONDANSETRON HCL 4 MG/2ML IJ SOLN
INTRAMUSCULAR | Status: DC | PRN
  Administered 2012-04-05: 4 mg via INTRAVENOUS

## 2012-04-05 MED ORDER — CEFAZOLIN SODIUM-DEXTROSE 2-3 GM-% IV SOLR
INTRAVENOUS | Status: DC | PRN
  Administered 2012-04-05: 2 g via INTRAVENOUS

## 2012-04-05 MED ORDER — FENTANYL CITRATE 0.05 MG/ML IJ SOLN
INTRAMUSCULAR | Status: DC | PRN
  Administered 2012-04-05: 25 ug via INTRATHECAL

## 2012-04-05 MED ORDER — CEFAZOLIN SODIUM-DEXTROSE 2-3 GM-% IV SOLR: 2.0000 g | INTRAVENOUS | Status: DC

## 2012-04-05 MED ORDER — KETOROLAC TROMETHAMINE 30 MG/ML IJ SOLN
30.0000 mg | Freq: Four times a day (QID) | INTRAMUSCULAR | Status: AC | PRN
  Administered 2012-04-05: 30 mg via INTRAVENOUS

## 2012-04-05 MED ORDER — FENTANYL CITRATE 0.05 MG/ML IJ SOLN
INTRAMUSCULAR | Status: DC | PRN
  Administered 2012-04-05 (×3): 25 ug via INTRAVENOUS

## 2012-04-05 MED ORDER — FENTANYL CITRATE 0.05 MG/ML IJ SOLN: 25.0000 ug | INTRAMUSCULAR | Status: DC | PRN

## 2012-04-05 MED ORDER — LACTATED RINGERS IV SOLN
40.0000 [IU] | INTRAVENOUS | Status: DC | PRN
  Administered 2012-04-05: 40 [IU] via INTRAVENOUS

## 2012-04-05 MED ORDER — FENTANYL CITRATE 0.05 MG/ML IJ SOLN
INTRAMUSCULAR | Status: AC
  Filled 2012-04-05: qty 2

## 2012-04-05 MED ORDER — OXYTOCIN 10 UNIT/ML IJ SOLN
INTRAMUSCULAR | Status: AC
  Filled 2012-04-05: qty 1

## 2012-04-05 MED ORDER — NALOXONE HCL 0.4 MG/ML IJ SOLN: 0.4000 mg | INTRAMUSCULAR | Status: DC | PRN

## 2012-04-05 MED ORDER — PROMETHAZINE HCL 25 MG/ML IJ SOLN: 6.2500 mg | INTRAMUSCULAR | Status: DC | PRN

## 2012-04-05 MED ORDER — PHENYLEPHRINE 40 MCG/ML (10ML) SYRINGE FOR IV PUSH (FOR BLOOD PRESSURE SUPPORT)
PREFILLED_SYRINGE | INTRAVENOUS | Status: AC
  Filled 2012-04-05: qty 10

## 2012-04-05 MED ORDER — MORPHINE SULFATE 0.5 MG/ML IJ SOLN
INTRAMUSCULAR | Status: AC
  Filled 2012-04-05: qty 10

## 2012-04-05 MED ORDER — ONDANSETRON HCL 4 MG/2ML IJ SOLN
INTRAMUSCULAR | Status: AC
  Filled 2012-04-05: qty 2

## 2012-04-05 SURGICAL SUPPLY — 27 items
BARRIER ADHS 3X4 INTERCEED (GAUZE/BANDAGES/DRESSINGS) IMPLANT
BRR ADH 4X3 ABS CNTRL BYND (GAUZE/BANDAGES/DRESSINGS)
CLOTH BEACON ORANGE TIMEOUT ST (SAFETY) ×2 IMPLANT
DRSG COVADERM 4X10 (GAUZE/BANDAGES/DRESSINGS) ×2 IMPLANT
DURAPREP 26ML APPLICATOR (WOUND CARE) ×1 IMPLANT
ELECT REM PT RETURN 9FT ADLT (ELECTROSURGICAL) ×2
ELECTRODE REM PT RTRN 9FT ADLT (ELECTROSURGICAL) ×1 IMPLANT
EXTRACTOR VACUUM KIWI (MISCELLANEOUS) IMPLANT
GLOVE BIO SURGEON STRL SZ 6.5 (GLOVE) ×2 IMPLANT
GLOVE BIOGEL PI IND STRL 7.0 (GLOVE) ×2 IMPLANT
GLOVE BIOGEL PI INDICATOR 7.0 (GLOVE) ×2
GOWN PREVENTION PLUS LG XLONG (DISPOSABLE) ×6 IMPLANT
KIT ABG SYR 3ML LUER SLIP (SYRINGE) ×1 IMPLANT
NDL HYPO 25X5/8 SAFETYGLIDE (NEEDLE) IMPLANT
NEEDLE HYPO 25X5/8 SAFETYGLIDE (NEEDLE) ×2 IMPLANT
NS IRRIG 1000ML POUR BTL (IV SOLUTION) ×2 IMPLANT
PACK C SECTION WH (CUSTOM PROCEDURE TRAY) ×2 IMPLANT
PAD OB MATERNITY 4.3X12.25 (PERSONAL CARE ITEMS) ×1 IMPLANT
SLEEVE SCD COMPRESS KNEE LRG (MISCELLANEOUS) IMPLANT
SLEEVE SCD COMPRESS KNEE MED (MISCELLANEOUS) IMPLANT
SUT VIC AB 0 CT1 36 (SUTURE) ×13 IMPLANT
SUT VIC AB 2-0 CT1 27 (SUTURE) ×2
SUT VIC AB 2-0 CT1 TAPERPNT 27 (SUTURE) ×1 IMPLANT
SUT VIC AB 4-0 PS2 27 (SUTURE) ×2 IMPLANT
TOWEL OR 17X24 6PK STRL BLUE (TOWEL DISPOSABLE) ×4 IMPLANT
TRAY FOLEY CATH 14FR (SET/KITS/TRAYS/PACK) ×1 IMPLANT
WATER STERILE IRR 1000ML POUR (IV SOLUTION) ×1 IMPLANT

## 2012-04-05 NOTE — Anesthesia Preprocedure Evaluation (Signed)
Anesthesia Evaluation  Patient identified by MRN, date of birth, ID band Patient awake    Reviewed: Allergy & Precautions, H&P , NPO status , Patient's Chart, lab work & pertinent test results  Airway Mallampati: II      Dental No notable dental hx.    Pulmonary neg pulmonary ROS,  breath sounds clear to auscultation  Pulmonary exam normal       Cardiovascular Exercise Tolerance: Good negative cardio ROS  Rhythm:regular Rate:Normal     Neuro/Psych negative neurological ROS  negative psych ROS   GI/Hepatic negative GI ROS, Neg liver ROS,   Endo/Other  negative endocrine ROS  Renal/GU negative Renal ROS  negative genitourinary   Musculoskeletal   Abdominal Normal abdominal exam  (+)   Peds  Hematology negative hematology ROS (+)   Anesthesia Other Findings   Reproductive/Obstetrics (+) Pregnancy                           Anesthesia Physical Anesthesia Plan  ASA: II and emergent  Anesthesia Plan: Spinal   Post-op Pain Management:    Induction:   Airway Management Planned:   Additional Equipment:   Intra-op Plan:   Post-operative Plan:   Informed Consent: I have reviewed the patients History and Physical, chart, labs and discussed the procedure including the risks, benefits and alternatives for the proposed anesthesia with the patient or authorized representative who has indicated his/her understanding and acceptance.     Plan Discussed with: Anesthesiologist, CRNA and Surgeon  Anesthesia Plan Comments:         Anesthesia Quick Evaluation  

## 2012-04-05 NOTE — Addendum Note (Signed)
Addendum  created 04/05/12 0943 by Elbert Ewings, CRNA   Modules edited:Notes Section

## 2012-04-05 NOTE — Anesthesia Postprocedure Evaluation (Signed)
  Anesthesia Post-op Note  Patient: Alyssa Wallace  Procedure(s) Performed: Procedure(s) (LRB) with comments: CESAREAN SECTION (N/A) - Primary Cesarean Section Delivery Girl @ [phone], Apgars 5/8  Patient Location: PACU and Mother/Baby  Anesthesia Type:Spinal  Level of Consciousness: awake, alert  and oriented  Airway and Oxygen Therapy: Patient Spontanous Breathing  Post-op Pain: mild  Post-op Assessment: Patient's Cardiovascular Status Stable, Respiratory Function Stable, No signs of Nausea or vomiting and Pain level controlled  Post-op Vital Signs: stable  Complications: No apparent anesthesia complications

## 2012-04-05 NOTE — Op Note (Signed)
Procedure: Primary low transverse cesarean section Preoperative diagnosis: Intrauterine pregnancy 33 weeks 5 days gestation, breech presentation in active labor Postoperative diagnosis: Intrauterine pregnancy delivered, live born female infant, 3 lbs. 10 oz. Surgeon: Dr. Scheryl Darter Asst.: Deirdre Christy Gentles CNM Anesthesia: Spinal by Dr. Brayton Caves Estimated blood loss: 700 mL Drain: Foley catheter Complications: None Counts: Correct   Patient gave written consent for cesarean section. The fetus was in breech presentation. She was admitted to the antenatal unit with preterm premature rupture of membranes. She was found to be in active labor 9 cm dilated. Patient identification was confirmed and she is brought to the operating room. Spinal anesthesia was induced and she was placed in dorsal supine position. Foley catheter was placed and the abdomen was sterilely prepped and draped. A #10 blade was used to make a Pfannenstiel incision and the incision was carried to the fascia. Fascial incision was extended with curved Mayo scissors. Fascia was separated from underlying tissue attachments with blunt and sharp dissection. Bellies of the rectus muscles were separated at the midline. Peritoneum was entered and the incision was extended. The lower uterine segment was scored transversely and entered at the midline with #10 blade. Incision was extended transversely. The breech was elevated and delivered. After delivery of the body incision needed to be extended on both sides with bandage scissors and the head was delivered. There was a single loop of nuchal cord. The cord was clamped and cut and infant was handed to nursery personnel. Infant was a female 3 lbs. 10 oz. with a cord pH of 7.23. The placenta was removed uterine cavity was explored. The uterine incision was closed with a running suture with 0 Vicryl. An imbricating layer followed with 0 Vicryl. Hemostatic suture was placed on the right side of the  incision and good hemostasis was seen. The adnexa appeared normal. The pelvis was irrigated. The Alexis retractor was removed. The anterior peritoneum was closed with a running suture with 2-0 Vicryl. The fascia was closed with a running suture with 0 Vicryl. Hemostasis was assured in the incision and incision was irrigated. Skin was closed with a running subcuticular suture with 4-0 Vicryl. A sterile dressing was applied. The patient tolerated the procedure well and was brought in stable condition to PACU.  Dr. Scheryl Darter 04/05/2012 3:28 AM

## 2012-04-05 NOTE — Anesthesia Preprocedure Evaluation (Deleted)
Anesthesia Evaluation  Patient identified by MRN, date of birth, ID band Patient awake    Reviewed: Allergy & Precautions, H&P , NPO status , Patient's Chart, lab work & pertinent test results  Airway Mallampati: II      Dental No notable dental hx.    Pulmonary neg pulmonary ROS,  breath sounds clear to auscultation  Pulmonary exam normal       Cardiovascular Exercise Tolerance: Good negative cardio ROS  Rhythm:regular Rate:Normal     Neuro/Psych negative neurological ROS  negative psych ROS   GI/Hepatic negative GI ROS, Neg liver ROS,   Endo/Other  negative endocrine ROS  Renal/GU negative Renal ROS  negative genitourinary   Musculoskeletal   Abdominal Normal abdominal exam  (+)   Peds  Hematology negative hematology ROS (+)   Anesthesia Other Findings   Reproductive/Obstetrics (+) Pregnancy                           Anesthesia Physical Anesthesia Plan  ASA: II and emergent  Anesthesia Plan: Spinal   Post-op Pain Management:    Induction:   Airway Management Planned:   Additional Equipment:   Intra-op Plan:   Post-operative Plan:   Informed Consent: I have reviewed the patients History and Physical, chart, labs and discussed the procedure including the risks, benefits and alternatives for the proposed anesthesia with the patient or authorized representative who has indicated his/her understanding and acceptance.     Plan Discussed with: Anesthesiologist, CRNA and Surgeon  Anesthesia Plan Comments:         Anesthesia Quick Evaluation  

## 2012-04-05 NOTE — Progress Notes (Signed)
Patient ID: Alyssa Wallace, female   DOB: 03-Nov-1985, 26 y.o.   MRN: [id number] CTSP for contractions  Subjective G2P0101 at [redacted]w[redacted]d now 4 days PPROM began having intermittent low back and lower abdominal pain about 2 hrs ago. Pains are becoming more frequent about q 5 min, more intense now crying and writhing in the bed during episodes which last 15-20 seconds. Also about 2 hrs ago she began having watery pink vaginal fluid which had previously been clear. Last ate 1800. Last drank 1 hr ago.    Objective Filed Vitals:   04/04/12 1953  BP: 102/58  Pulse: 64  Temp: 98.5 F (36.9 C)  Resp: 18   Appears anxious, crying in apparent pain with ?contractions Abd: soft, NT between ?contractions, nonpalpable UCs SSE: pink fluid pooling in vault and cx appears thick and closed  FHR  140-145 baseline, reactive, occ sharp variable deceleration Toco: Occ run of q4-5 min low amplitude 30 sec contraction; suboptimal tracing with pt moving around whenever she feels discomfort  Results for orders placed during the hospital encounter of 04/01/12 (from the past 24 hour(s))  CBC     Status: Abnormal   Collection Time   04/04/12  7:30 AM      Component Value Range   WBC 8.0  4.0 - 10.5 K/uL   RBC 3.75 (*) 3.87 - 5.11 MIL/uL   Hemoglobin 12.4  12.0 - 15.0 g/dL   HCT 40.9  81.1 - 91.4 %   MCV 96.5  78.0 - 100.0 fL   MCH 33.1  26.0 - 34.0 pg   MCHC 34.3  30.0 - 36.0 g/dL   RDW 78.2  95.6 - 21.3 %   Platelets 204  150 - 400 K/uL  TYPE AND SCREEN     Status: Normal   Collection Time   04/04/12  7:30 AM      Component Value Range   ABO/RH(D) O POS     Antibody Screen NEG     Sample Expiration 04/07/2012    MRSA PCR SCREENING     Status: Normal   Collection Time   04/04/12  9:55 PM      Component Value Range   MRSA by PCR NEGATIVE  NEGATIVE   Assessment PPROM, breech presentation Suspect prodromal uterine activity, but labor not established Category  1 FHR  Plan Continuous  EFM NPO Stadol 1 mg IVP and repeat in 20-30 min if ineffective C/W Dr. Debroah Loop who agrees with plan  [name], CNM 04/05/2012 12:44 AM

## 2012-04-05 NOTE — Progress Notes (Addendum)
Patient ID: Alyssa Wallace, female   DOB: 1985/05/26, 26 y.o.   MRN: [id number] CTSP afer she had second dose of Stadol 1 mg IVP without relief.  UCs q71min Cx  9/+1 complete breech Dr. Debroah Loop here. Preparing for C/S [name], CNM 04/05/2012 2:03 AM

## 2012-04-05 NOTE — Anesthesia Procedure Notes (Signed)
Spinal  Patient location during procedure: OR Start time: 04/05/2012 2:15 AM Staffing Anesthesiologist: Brayton Caves R Performed by: anesthesiologist  Preanesthetic Checklist Completed: patient identified, site marked, surgical consent, pre-op evaluation, timeout performed, IV checked, risks and benefits discussed and monitors and equipment checked Spinal Block Patient position: sitting Prep: DuraPrep Patient monitoring: heart rate, cardiac monitor, continuous pulse ox and blood pressure Approach: midline Location: L3-4 Injection technique: single-shot Needle Needle type: Sprotte  Needle gauge: 24 G Needle length: 9 cm Assessment Sensory level: T4 Additional Notes Patient identified.  Risk benefits discussed including failed block, incomplete pain control, headache, nerve damage, paralysis, blood pressure changes, nausea, vomiting, reactions to medication both toxic or allergic, and postpartum back pain.  Patient expressed understanding and wished to proceed.  All questions were answered.  Sterile technique used throughout procedure.  CSF was clear.  No parasthesia or other complications.  Please see nursing notes for vital signs.

## 2012-04-05 NOTE — Transfer of Care (Signed)
Immediate Anesthesia Transfer of Care Note  Patient: Alyssa Wallace  Procedure(s) Performed: Procedure(s) (LRB) with comments: CESAREAN SECTION (N/A) - Primary Cesarean Section Delivery Girl @ [phone], Apgars 5/8  Patient Location: PACU  Anesthesia Type:Spinal  Level of Consciousness: awake, alert  and oriented  Airway & Oxygen Therapy: Patient Spontanous Breathing  Post-op Assessment: Report given to PACU RN  Post vital signs: Reviewed and stable  Complications: No apparent anesthesia complications

## 2012-04-05 NOTE — Anesthesia Postprocedure Evaluation (Signed)
Anesthesia Post Note  Patient: Alyssa Wallace  Procedure(s) Performed: Procedure(s) (LRB): CESAREAN SECTION (N/A)  Anesthesia type: Spinal  Patient location: PACU  Post pain: Pain level controlled  Post assessment: Post-op Vital signs reviewed  Last Vitals:  Filed Vitals:   04/05/12 0330  BP: 98/57  Pulse: 82  Temp:   Resp: 26    Post vital signs: Reviewed  Level of consciousness: awake  Complications: No apparent anesthesia complications

## 2012-04-06 ENCOUNTER — Encounter (HOSPITAL_COMMUNITY): Payer: Self-pay | Admitting: Obstetrics & Gynecology

## 2012-04-06 LAB — CBC
HCT: 32.8 % — ABNORMAL LOW (ref 36.0–46.0)
MCH: 32.6 pg (ref 26.0–34.0)
MCHC: 33.8 g/dL (ref 30.0–36.0)
MCV: 96.5 fL (ref 78.0–100.0)
Platelets: 184 10*3/uL (ref 150–400)
RDW: 13.7 % (ref 11.5–15.5)
WBC: 7.3 10*3/uL (ref 4.0–10.5)

## 2012-04-06 NOTE — Progress Notes (Signed)
I have seen the patient with the resident/student and agree with the above.   

## 2012-04-06 NOTE — Progress Notes (Signed)
Subjective: Postpartum Day 1: Cesarean Delivery Patient reports tolerating PO, + flatus and no problems voiding.  Pain well-controlled with medications.  Objective: Vital signs in last 24 hours: Temp:  [97.3 F (36.3 C)-98.7 F (37.1 C)] 98.3 F (36.8 C) (11/28 0506) Pulse Rate:  [57-74] 71  (11/28 0506) Resp:  [16-18] 18  (11/28 0506) BP: (86-129)/(55-81) 86/55 mmHg (11/28 0506) SpO2:  [96 %-99 %] 98 % (11/28 0506)  Physical Exam:  General: alert, cooperative and no distress Lochia: appropriate Uterine Fundus: firm Incision: dressing still in place, not bleeding, no drainage DVT Evaluation: No evidence of DVT seen on physical exam.   Basename 04/06/12 0521 04/05/12 0205  HGB 11.1* 13.9  HCT 32.8* 40.0    Assessment/Plan: Status post Cesarean section. Doing well postoperatively.  Continue current care. Planning breast and bottle feeds. Undecided on contraception.  Alyssa Wallace 04/06/2012, 7:51 AM

## 2012-04-07 NOTE — Progress Notes (Signed)
Subjective: Postpartum Day 2: Cesarean Delivery Patient reports incisional pain, tolerating PO and no problems voiding.  Pain is well managed with medications.  Objective: Vital signs in last 24 hours: Temp:  [97.5 F (36.4 C)-98.3 F (36.8 C)] 98.3 F (36.8 C) (11/29 0533) Pulse Rate:  [68-78] 68  (11/29 0533) Resp:  [16-18] 18  (11/29 0533) BP: (87-99)/(45-64) 99/64 mmHg (11/29 0533) SpO2:  [97 %-100 %] 97 % (11/29 0533)  Physical Exam:  General: alert, cooperative and no distress Lochia: appropriate, scant Uterine Fundus: firm, -1 Incision: Dressing removed by RN this morning.  Incision healing well, no significant drainage, no dehiscence, no significant erythema. DVT Evaluation: No evidence of DVT seen on physical exam. Negative Homan's sign. No cords or calf tenderness. No significant calf/ankle edema.   Basename 04/06/12 0521 04/05/12 0205  HGB 11.1* 13.9  HCT 32.8* 40.0    Assessment/Plan: Status post Cesarean section. Doing well postoperatively.  Continue current care. Discussed early discharge with pt.  Pt desires to stay another night.  Plan for discharge tomorrow.  LEFTWICH-KIRBY, Charlayne Vultaggio 04/07/2012, 7:35 AM

## 2012-04-07 NOTE — Progress Notes (Signed)
I visited with pt and family while making rounds on the unit.  They were in good spirits and seem to be supporting each other well right now.  They report that their baby is doing well.  Their 27 year old son was also premature and spent time in the NICU so they are familiar with this process. Their son was disappointed that he could not go to see his baby sister.   They have good family support in town.  I offered emotional support and compassionate listening.  [address] [employer] Pager, [phone] 11:57 AM   04/07/12 1100  Clinical Encounter Type  Visited With Patient and family together  Visit Type Spiritual support;Initial  Spiritual Encounters  Spiritual Needs Emotional  Stress Factors  Patient Stress Factors (Baby in NICU)

## 2012-04-07 NOTE — Clinical Social Work Maternal (Signed)
    Clinical Social Work Department PSYCHOSOCIAL ASSESSMENT - MATERNAL/CHILD 04/07/2012  Patient:  Alyssa Wallace, Alyssa Wallace  Account Number:  [id number]  Admit Date:  04/01/2012  Marjo Bicker Name:   Burnett Corrente    Clinical Social Worker:  Andy Gauss   Date/Time:  04/07/2012 11:18 AM  Date Referred:  04/07/2012   Referral source  NICU     Referred reason  NICU   Other referral source:    I:  FAMILY / HOME ENVIRONMENT Child's legal guardian:  PARENT  Guardian - Name Guardian - Age Guardian - Address  Lilliam Chamblee [address]; [place], [place] [address]  Chong Sicilian  (same as above)   Other household support members/support persons Name Relationship DOB   SON 42 year old   Other support:    II  PSYCHOSOCIAL DATA Information Source:  Patient Interview  Insurance claims handler Resources Employment:   [employer]   Financial resources:  [employer] If [employer] - County:  [employer] / Grade:   Maternity Care Coordinator / Child Services Coordination / Early Interventions:  Cultural issues impacting care:    III  STRENGTHS Strengths  Adequate Resources  Home prepared for Child (including basic supplies)  Supportive family/friends   Strength comment:    IV  RISK FACTORS AND CURRENT PROBLEMS Current Problem:  YES   Risk Factor & Current Problem Patient Issue Family Issue Risk Factor / Current Problem Comment  Other - See comment Y N NICU admission    V  SOCIAL WORK ASSESSMENT Sw met with pt to assess her current social situation and offer resources if needed.  Pt lives with her spouse and 58 year old son.  The parents state they are happy about the birth and did not seem emotional about premature delivery.  They have not purchased any supplies for the infant, as pt explained FOB is not working at this time.  Sw told the parents about resources available through [employer] and will make referral.  Pt was appreciative.   She plans to take the infant to [facility].  They understand visiting hours and have reliable transportation. Both parents appear to be appropriate.  Sw will continue to assist family as needed until discharge.      VI SOCIAL WORK PLAN Social Work Plan  Psychosocial Support/Ongoing Assessment of Needs   Type of pt/family education:   If child protective services report - county:   If child protective services report - date:   Information/referral to community resources comment:   Other social work plan:

## 2012-04-08 MED ORDER — DSS 100 MG PO CAPS: 100.0000 mg | ORAL_CAPSULE | Freq: Two times a day (BID) | ORAL | Status: DC

## 2012-04-08 MED ORDER — OXYCODONE-ACETAMINOPHEN 5-325 MG PO TABS
1.0000 | ORAL_TABLET | ORAL | Status: DC | PRN
Start: 2012-04-08 — End: 2012-05-05

## 2012-04-08 MED ORDER — IBUPROFEN 600 MG PO TABS: 600.0000 mg | ORAL_TABLET | Freq: Four times a day (QID) | ORAL | Status: DC

## 2012-04-08 NOTE — Progress Notes (Signed)
Discharge instructions reviewed with patient per spanish interpreter.  Patient states understanding of home care and signs/symptoms to report to MD.  No home equipment needed.  Ambulated to car with staff without incident and discharged to home with family.

## 2012-04-08 NOTE — Discharge Summary (Signed)
Obstetric Discharge Summary Reason for Admission: PPROM Prenatal Procedures: none Intrapartum Procedures: cesarean: low cervical, transverse and secondary to breech in labor Postpartum Procedures: none Complications-Operative and Postpartum: none Hemoglobin  Date Value Range Status  04/06/2012 11.1* 12.0 - 15.0 g/dL Final     DELTA CHECK NOTED     REPEATED TO VERIFY  12/15/2011 13.2   Final     HCT  Date Value Range Status  04/06/2012 32.8* 36.0 - 46.0 % Final  12/15/2011 39   Final    Physical Exam:  General: alert, cooperative and no distress Lochia: appropriate Uterine Fundus: firm, NT Incision: no significant drainage, no significant erythema, no induration DVT Evaluation: Negative Homan's sign. No cords or calf tenderness.  Discharge Diagnoses: PPROM with labor  Discharge Information: Date: 04/08/2012 Activity: pelvic rest Diet: routine Medications: Ibuprofen, Colace and Percocet Condition: stable Instructions: refer to practice specific booklet Discharge to: home Follow-up Information    Follow up with [facility]. (An appointment will be made for you in 5 weeks for postpartum appointment)    Contact information:   [address] [place] [address] [phone]       Patient plans to use condoms for contraception and is currently breastfeeding  Newborn Data: Live born female  Birth Weight: 3 lb 10.6 oz (1662 g) APGAR: 5, 8  Infant will remain in NICU  Alyssa Wallace 04/08/2012, 12:22 PM

## 2012-04-12 ENCOUNTER — Ambulatory Visit (INDEPENDENT_AMBULATORY_CARE_PROVIDER_SITE_OTHER): Payer: Self-pay | Admitting: Obstetrics & Gynecology

## 2012-04-12 ENCOUNTER — Encounter: Payer: Self-pay | Admitting: Obstetrics & Gynecology

## 2012-04-12 VITALS — BP 104/77 | HR 81 | Temp 98.4°F | Resp 12 | Wt 118.9 lb

## 2012-04-12 DIAGNOSIS — R21 Rash and other nonspecific skin eruption: Secondary | ICD-10-CM

## 2012-04-12 MED ORDER — HYDROXYZINE HCL 10 MG PO TABS: 10.0000 mg | ORAL_TABLET | ORAL | Status: DC | PRN

## 2012-04-12 NOTE — Progress Notes (Signed)
Patient ID: Alyssa Wallace, female   DOB: [birth year], 26 y.o.   MRN: [id number]  Rash on torso, upper and lower extremities started yesterday. Pruritic. No new products on skin.

## 2012-04-12 NOTE — Patient Instructions (Signed)
Parto por Orvan Seen posteriores  (Cesarean Delivery, Care After) Siga estas instrucciones durante las prximas semanas. Estas indicaciones le proporcionan informacin general acerca de cmo deber cuidarse despus del procedimiento. El mdico tambin podr darle instrucciones especficas. El tratamiento ha sido planificado segn las prcticas mdicas actuales, pero en algunos casos pueden ocurrir problemas. Comunquese con el mdico si tiene algn problema o tiene preguntas despus del procedimiento.  INSTRUCCIONES PARA EL CUIDADO EN EL HOGAR  La curacin puede demorar algn tiempo. Puede sentir molestias, sensibilidad, hinchazn y hematomas en el sitio de la operacin, durante algunas semanas. Esto es normal y Scientist, clinical (histocompatibility and immunogenetics) a medida que pase el [place].  Actividad  Al volver a su casa, durante las 2 primeras semanas descanse siempre que pueda.  Siempre que le sea posible, solicite ayuda para Education officer, environmental las actividades domsticas y para el cuidado del beb, durante 2  3 semanas.  Limite las tareas domsticas y la actividad social. Aumente gradualmente su actividad a medida que recupera la fuerza.  No suba escaleras ms de 2 o 3 veces por da.  No levante nada que sea ms pesado que su beb.  Siga las instrucciones de su mdico con respecto a Solicitor.  Consulte con su mdico si puede practicar ejercicios. Nutricin  Puede volver a su dieta habitual. Consuma una dieta normal y bien balanceada.  Debe ingerir gran cantidad de lquido para mantener la orina de tono claro o color amarillo plido.  Siga tomando los suplementos para el perodo prenatal o el complejo multivitamnico.  No beba alcohol hasta que el mdico la autorice. Evacuacin Debe retornar a su funcin intestinal habitual. Si est constipada, consulte a su mdico para tomar un laxante suave que la ayudar a ir al bao. Los lquidos y los alimentos que contengan salvado la ayudarn para el problema de la  constipacin. Gradualmente agregue frutas, verduras y salvado a su dieta. Higiene  Puede darse una ducha, lavarse el cabello y usar la baera, excepto que su mdico le indique otra cosa.  Contine con los cuidados perineales hasta que la secrecin vaginal se detenga.  No se haga duchas vaginales ni use tampones hasta que el mdico la autorice. Fiebre Si se siente [place] o tiene escalofros, Automotive engineer. La fiebre puede indicar que hay una infeccin. Las infecciones se tratan con medicamentos.  Control del Dolor  Tome slo medicamentos de venta libre o prescriptos, segn las indicaciones del mdico. No tome aspirina. Puede ocasionar hemorragias.  No conduzca mientras toma analgsicos.  Consulte a su mdico para volver a tomar o ajustar las dosis de sus medicamentos habituales. Cuidados de la Incisin  Limpie la herida (incisin) suavemente con jabn y agua.  Si el mdico la autoriza, deje la herida sin vendaje, excepto que observe supuracin o irritacin.  Si tiene pequeas tiras [employer] que cruzan la incisin y no se caen dentro de los [address], retrelas suavemente.  Controle diariamente la incisin y observe si aumenta el enrojecimiento, supura, se hincha o se separa la piel.  Abrace una almohada cuando tosa o estornude. Esto ayuda a Engineer, materials. Cuidados Vaginales La secrecin vaginal o la hemorragia pueden durar hasta 6 semanas. Si esta secrecin se torna de color rojo brillante, presenta un olor ftido, es demasiado abundante, contiene cogulos o si siente ardor al Geographical information systems officer o debe hacerlo con mucha frecuencia, llame al profesional que la asiste. Si la hemorragia disminuye y luego se hace ms intensa, es su organismo que le dice que debe disminuir la [place]  y relajarse ms. Relaciones Sexuales  Consulte a su mdico antes de reanudar la actividad sexual. [place], luego de 4 a 6 semanas, si se siente bien y descansada, podr reanudar la actividad sexual.  Evite las posiciones que fuercen el sitio de la incisin.  Puede quedar embarazada antes de tener el perodo. Si reanuda las relaciones sexuales, debe utilizar algn mtodo anticonceptivo si no quiere quedar embarazada enseguida. Prcticas Saludables  Cumpla con todas las visitas de control, segn le indique su mdico. Generalmente el mdico indicar que vuelva en 2  3 semanas.  Contine con los exmenes plvicos anuales.  Contine con el autoexamen de [employer] y los exmenes anuales que incluyan un [place]. Cuidado de las Mamas  Si no est Insurance risk surveyor, y sus Building control surveyor se tornan sensibles, se endurecen o la [place], puede usar un sostn que le ajuste firmemente y Contractor hielo.  Si est amamantando, use un buen sostn.  Comunquese con el profesional que la asiste si siente dolor en las [place], sntomas similares a una gripe, Teacher, English as a foreign language o endurecimiento y enrojecimiento de las mamas. Depresin Posparto Despus del entusiasmo por la llegada del beb, generalmente puede suceder un perodo de abatimiento o depresin. Comente sus sensaciones con su pareja, familiares y amigos. Puede estar originado en la modificacin de los niveles de hormonas en el organismo. Si esto la preocupa, puede contactarse con el profesional que la asiste. MISCELNEAS  Limite el uso de bombachas de sostn o medias panty.  Si amamanta, puede ser que no tenga su perodo menstrual por algunos meses o ms. Esto es normal en una mujer que [place]. Si no Architectural technologist de las 6 [employer] posteriores a la interrupcin de la Market researcher, consulte a su mdico.  Si no est amamantando, debe esperar que comience a Clinical cytogeneticist de las 6 a 12 semanas posteriores al parto. Si no ha comenzado para la 11 semana, consulte a su mdico. SOLICITE ATENCIN MDICA SI:  Presenta enrojecimiento, hinchazn o aumento del dolor en la herida.  Aparece pus en la herida.  Advierte un olor ftido que proviene de la herida o del  vendaje.  La zona de la inyeccin intravenosa se hincha, se inflama o duele.  La herida se abre (los bordes no estn unidos).  Se siente mareada o sufre un desmayo.  Siente dolor al Geographical information systems officer u [place] con [place].  Presenta diarrea.  Presenta nuseas o vmitos.  Brett Fairy secrecin vaginal anormal.  Aparece una erupcin cutnea.  Sufre algn tipo de reaccin anormal o alrgica debido a los medicamentos que toma.  El dolor no se [place] con los medicamentos o [place].  Su temperatura es de 101 F (38.3 C), o es 100.4 F (38 C) tomada 2 veces en un perodo de 4 horas. SOLICITE ATENCIN MDICA DE INMEDIATO SI:  La temperatura se eleva por encima de 102 F (38.9 C).  Siente dolor abdominal.  Siente dolor en el pecho.  Le falta el aire.  Se desmaya.  Presenta dolor, enrojecimiento o hinchazn en las piernas.  Sufre una hemorragia intensa con o sin cogulos de Retail buyer. Document Released: 04/26/2005 Document Revised: 07/19/2011 [facility] Patient Information 2013 [place], [place].

## 2012-04-12 NOTE — Progress Notes (Signed)
  Subjective:    Patient ID: Alyssa Wallace, female    DOB: 1985-07-20, 26 y.o.   MRN: [id number]  HPIPatient's last menstrual period was 08/13/2011. [id number] Patient had cesarean section 04/05/12 for preterm labor, breech. A red pruritic rash developed yesterday on her trunk an extremities. She is now on no medications.    Review of Systems No pain; rash ,itch.  No fever, respiratory sx, GI sx.    Objective:   Physical Exam Filed Vitals:   04/12/12 1415  BP: 104/77  Pulse: 81  Temp: 98.4 F (36.9 C)  Resp: 12   Maculopapular erythematous rash on chest, abdomen, extremities. No exudate.        Assessment & Plan:  Hypersensitivity skin reaction postoperative, etiology unknown.    Rx hydroxyzine 10 mg po Q 4 hr prn Report if not better RTC  6 weeks postop.  Alain Deschene 04/12/2012 2:45 PM

## 2012-04-13 ENCOUNTER — Encounter: Payer: Self-pay | Admitting: Advanced Practice Midwife

## 2012-04-18 ENCOUNTER — Ambulatory Visit (HOSPITAL_COMMUNITY): Payer: Medicaid Other

## 2012-05-01 ENCOUNTER — Ambulatory Visit: Payer: Medicaid Other | Admitting: Obstetrics & Gynecology

## 2012-05-05 ENCOUNTER — Ambulatory Visit (INDEPENDENT_AMBULATORY_CARE_PROVIDER_SITE_OTHER): Payer: Medicaid Other | Admitting: Obstetrics & Gynecology

## 2012-05-05 ENCOUNTER — Encounter: Payer: Self-pay | Admitting: Obstetrics & Gynecology

## 2012-05-05 ENCOUNTER — Ambulatory Visit: Payer: Self-pay | Admitting: Obstetrics & Gynecology

## 2012-05-05 NOTE — Patient Instructions (Signed)
Lactancia materna  (Breastfeeding) Decidir amamantar es una de las mejores elecciones que puede hacer por usted y su beb. La informacin que se brinda a continuacin le dar una breve visin de los beneficios de la lactancia materna as como de las dudas ms frecuentes alrededor de ella.  LOS BENEFICIOS DE AMAMANTAR  Para el beb   La primera leche (calostro ) ayuda al mejor funcionamiento del sistema digestivo del beb.   La leche tiene anticuerpos que provienen de la madre y que ayudan a prevenir las infecciones en el beb.   El beb tiene una menor incidencia de asma, alergias y del sndrome de muerte sbita del lactante (SMSL).   Los nutrientes en la leche materna son mejores para el beb que los preparados para lactantes y la leche materna ayuda a un mejor desarrollo del cerebro del beb.   Los bebs amamantados tienen menos gases, clicos y estreimiento.  Para la mam   La lactancia materna favorece el desarrollo de un vnculo muy especial entre la madre y el beb.   Es ms conveniente, siempre disponible y a la temperatura adecuada y econmico.   Consume caloras en la madre y la ayuda a perder el peso ganado durante el embarazo.   Favorece la contraccin del tero a su tamao normal, de manera ms rpida y disminuye las hemorragias luego del parto.   Las madres que amamantan tienen menor riesgo de desarrollar cncer de mama.  FRECUENCIA DEL AMAMANTAMIENTO   Un beb sano, nacido a trmino, puede amamantarse con tanta frecuencia como cada hora, o espaciar las comidas cada tres horas.   Observe al beb cuando manifieste signos de hambre. Amamante a su beb si muestra signos de hambre. Esta frecuencia variar de un beb a otro.   Amamntelo tan seguido como el beb lo solicite, o cuando usted sienta la necesidad de aliviar sus mamas.   Despierte al beb si han pasado 3  4 horas desde la ltima comida.   El amamantamiento frecuente la ayudar a producir ms  leche y a prevenir problemas de dolor en los pezones e hinchazn de las mamas.  POSICIN DEL BEBE PARA EL AMAMANTAMIENTO   Ya sea que se encuentre acostada o sentada, asegrese que el abdomen del beb enfrente el suyo.   Sostenga la mama con el pulgar por arriba y los otros 4 dedos por debajo. Asegrese que sus dedos se encuentren lejos del pezn y de la boca del beb.   Empuje suavemente los labios del beb con el pezn o con el dedo.   Cuando la boca del beb se abra lo suficiente, introduzca el pezn y la areola tanto como le sea posible dentro de la boca.   Coloque al beb cerca suyo de modo que su nariz y mejillas toquen las mamas al mamar.  ALIMENTACIN Y SUCCIN   La duracin de cada comida vara de un beb a otro y de una comida a otra.   El beb debe succionar entre 2 y 3 minutos para que le llegue leche. Esto se denomina "bajada". Por este motivo, permita que el nio se alimente en cada mama todo lo que desee. Terminar de mamar cuando haya recibido la cantidad adecuada de nutrientes.   Para detener la succin coloque su dedo en la comisura de la boca del nio y deslcelo entre sus encas antes de quitarle la mama de la boca. Esto la ayudar a evitar el dolor en los pezones.  COMO SABER SI EL BEB OBTIENE LA   SUFICIENTE LECHE MATERNA  Preguntarse si el beb obtiene la cantidad suficiente de leche es una preocupacin frecuente entre las madres. Puede asegurarse que el beb tiene la leche suficiente si:   El beb succiona activamente y usted escucha que traga .   El beb parece estar relajado y satisfecho despus de mamar.   El nio se alimenta al menos 8 a 12 veces en 24 horas. Alimntelo hasta que se desprenda por sus propios medios o se quede dormido en la primera mama (al menos durante 10 a 20 minutos), luego ofrzcale el otro lado.   El beb moja 5 a 6 paales desechables (6 a 8 paales de tela) en 24 horas cuando tiene 5  6 das de vida.   Tiene al menos 3 a 4  deposiciones todos los das en los primeros meses. La materia fecal debe ser blanda y amarillenta.   El beb debe aumentar 4 a 6 libras (120 a 170 gr.) por semana despus de los 4 das de vida.   Siente que las mamas se ablandan despus de amamantar  REDUCIR LA CONGESTIN DE LAS MAMAS   Durante la primera semana despus del parto, usted puede experimentar hinchazn en las mamas. Cuando las mamas estn congestionadas, se sienten calientes, llenas y molestas al tacto. Puede reducir la congestin si:   Lo amamanta frecuentemente, cada 2-3 horas. Las mams que amamantan pronto y con frecuencia tienen menos problemas de congestin.   Coloque compresas de hielo en sus mamas durante 10-20 minutos entre cada amamantamiento. Esto ayuda a reducir la hinchazn. Envuelva las bolsas de hielo en una toalla liviana para proteger su piel. Las bolsas de vegetales congelados funcionan bien para este propsito.   Tome una ducha tibia o aplique compresas hmedas calientes en las mamas durante 5 a 10 minutos antes de cada vez que amamanta. Esto aumenta la circulacin y ayuda a que la leche fluya.   Masajee suavemente la mama antes y durante la alimentacin. Con las puntas de los dedos, masajee desde la pared torcica hacia abajo hasta llegar al pezn, con movimientos circulares.   Asegrese que el nio vaca al menos una mama antes de cambiar de lado.   Use un sacaleche para vaciar la mama si el beb se duerme o no se alimenta bien. Tambin podr quitarse la leche con esa bomba si tiene que volver al trabajo o siente que las mamas estn congestionadas.   Evite los biberones, chupetes o complementar la alimentacin con agua o jugos en lugar de la leche materna. La leche materna es todo el alimento que el beb necesita. No es necesario que el nio ingiera agua o preparados de bibern. De hecho, es lo mejor para ayudar a que las mamas produzcan ms leche. no darle suplementos al nio durante las primeras  semanas.   Verifique que el beb se encuentra en la posicin correcta mientras lo alimenta.   Use un sostn que soporte bien sus mamas y evite los que tienen aro.   Consuma una dieta balanceada y beba lquidos en cantidad.   Descanse con frecuencia, reljese y tome sus vitaminas prenatales para evitar la fatiga, el estrs y la anemia.  Si sigue estas indicaciones, la congestin debe mejorar en 24 a 48 horas. Si an tiene dificultades, consulte a su asesor en lactancia.  CUDESE USTED MISMA  Cuide sus mamas.   Bese o dchese diariamente.   Evite usar jabn en los pezones.   Comience a amamantar del lado izquierdo en una comida   y del lado derecho en la siguiente.   Notar que aumenta el flujo de leche a los 2 a 5 das despus del parto. Puede sentir algunas molestias por la congestin, lo que hace que sus mamas estn duras y sensibles. La congestin disminuye en 24 a 48 horas. Mientras tanto, aplique toallas hmedas calientes durante 5 a 10 minutos antes de amamantar. Un masaje suave y la extraccin de un poco de leche antes de amamantar ablandarn las mamas y har ms fcil que el beb se agarre.   Use un buen sostn y seque al aire los pezones durante 3 a 4 minutos luego de cada alimentacin.   Solo utilice apsitos de algodn.   Utilice lanolina pura sobre los pezones luego de amamantar. No necesita lavarlos luego de alimentar al nio. Otra opcin es exprimir algunas gotas de leche y masajear suavemente los pezones.  Cumpla con estos cuidados   Consuma alimentos bien balanceados y refrigerios nutritivos.   Beba leche, jugos de fruta y agua para satisfacer la sed (alrededor de 8 vasos por da).   Descanse lo suficiente.  Evite los alimentos que usted note que pueden afectar al beb.  SOLICITE ATENCIN MDICA SI:   Tiene dificultad con la lactancia materna y necesita ayuda.   Tiene una zona de color rojo, dura y dolorosa en la mama que se acompaa de fiebre.    El beb est muy somnoliento como para alimentarse bien o tiene problemas para dormir.   Su beb moja menos de 6 paales al da, a los 5 das de vida.   La piel del beb o la parte blanca de sus ojos est ms amarilla de lo que estaba en el hospital.   Se siente deprimida.  Document Released: 04/26/2005 Document Revised: 10/26/2011 [employer] Patient Information 2013 [employer].  

## 2012-05-05 NOTE — Progress Notes (Signed)
  Subjective:    Patient ID: Alyssa Wallace, female    DOB: 09/26/1985, 26 y.o.   MRN: [id number]  HPI  26 year old F G2P0202 who presents for 6 week PPV after LTCS for PPROM with breech presentation performed 04/01/12.   She has no complaints today. Is currently breastfeeding and using condoms for contraception. Is taking pre-natal vitamins. No menstrual cycle since delivery.   Review of Systems Negative for pain, abnormal bleeding, depression, anxiety, breast pain     Objective:   Physical Exam BP 108/77  Pulse 63  Temp 97.2 F (36.2 C) (Oral)  Ht 5' (1.524 m)  Wt 118 lb 14.4 oz (53.933 kg)  BMI 23.22 kg/m2  Breastfeeding? Yes Gen: young female, well appearing, NAD, pleasant and conversant CV: RRR, no m/r/g, no JVD or carotid bruits Pulm: normal WOB, CTA-B Abd: well healing transverse incision, soft, NDNT, NABS Extremities: no edema or joint tenderness Skin: warm, dry, no rashes Neuro/Psych: A&Ox4, normal affect, speech, and thought content GU: EGBUS: no lesions Vagina: no blood in vault Cervix: no lesion; no mucopurulent d/c Uterus: small, mobile Adnexa: no masses; sl tender       Assessment & Plan:  26 year old F who presented for post partum visit after LTCS on 04/01/12. Continue breast feeding, using condoms for contraception, and taking pre-natal vitamins. Routine follow up in July 2014.

## 2012-05-16 ENCOUNTER — Encounter: Payer: Self-pay | Admitting: *Deleted

## 2012-06-28 ENCOUNTER — Encounter: Payer: Self-pay | Admitting: Obstetrics & Gynecology

## 2012-06-28 ENCOUNTER — Ambulatory Visit (INDEPENDENT_AMBULATORY_CARE_PROVIDER_SITE_OTHER): Payer: Self-pay | Admitting: Obstetrics & Gynecology

## 2012-06-28 VITALS — BP 107/63 | HR 78 | Temp 97.5°F | Resp 20 | Wt 119.6 lb

## 2012-06-28 DIAGNOSIS — Z9889 Other specified postprocedural states: Secondary | ICD-10-CM

## 2012-06-28 DIAGNOSIS — G8918 Other acute postprocedural pain: Secondary | ICD-10-CM

## 2012-06-28 DIAGNOSIS — Z98891 History of uterine scar from previous surgery: Secondary | ICD-10-CM

## 2012-06-28 DIAGNOSIS — R109 Unspecified abdominal pain: Secondary | ICD-10-CM

## 2012-06-28 NOTE — Progress Notes (Signed)
Pt had LTCS on 04/01/12 and PP visit on 05/05/12- no problems @ that time. 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 She is requesting a note stating that she needs less heavy work or to work less hours.  Pt also states that she has not resumed intercourse since delivery of her baby because she is afraid that she may still "be open" on the inside.

## 2012-06-28 NOTE — Patient Instructions (Signed)
Return to clinic for any scheduled appointments or for any gynecologic concerns as needed.   

## 2012-06-29 NOTE — Progress Notes (Signed)
History:  27 y.o. G2P0202 here today for abdominal pain after cesarean section on 04/05/12. Patient is Spanish-speaking only, Spanish interpreter present for this encounter.  She reports that she went back to work and had some pain while pushing a cart, she is worried that something will "open up on the inside".  She is afraid to move around a lot because of her surgery. Declines any incisional problems, chronic pain or other issues.  The following portions of the patient's history were reviewed and updated as appropriate: allergies, current medications, past family history, past medical history, past social history, past surgical history and problem list.  Review of Systems:  Pertinent items are noted in HPI.  Objective:  Physical Exam BP 107/63  Pulse 78  Temp(Src) 97.5 F (36.4 C) (Oral)  Resp 20  Wt 119 lb 9.6 oz (54.25 kg)  BMI 23.36 kg/m2 Gen: NAD Abd: Soft, nontender and nondistended. Well healed Pfannensteil incision, no drainage, induration or other concerning signs. No abnormal masses palpated.  Normal postoperative abdominal exam. Pelvic: Deferred  Assessment & Plan:  Patient reassured that her exam is normal, no activity restrictions.  It is normal to have a little pain initially, but she will not "open anything inside".  She was cleared to return to work without any restrictions, work Physicist, medical given. She was told to call/return for any worsening symptoms.

## 2012-07-03 ENCOUNTER — Encounter: Payer: Self-pay | Admitting: *Deleted

## 2012-07-06 ENCOUNTER — Telehealth: Payer: Self-pay | Admitting: *Deleted

## 2012-07-06 ENCOUNTER — Encounter: Payer: Self-pay | Admitting: *Deleted

## 2012-07-06 NOTE — Telephone Encounter (Signed)
Called patient with interpreter and informed her she does not need surgery or medical clearance - that we called her in error. Apologized to patient for the calls and assured her last exam which was postpartum was normal.  Message sent to staff in error with wrong patient name  By provider .

## 2012-07-06 NOTE — Telephone Encounter (Signed)
Message copied by Gerome Apley on Thu Jul 06, 2012  8:07 AM ------      Message from: Mayra Neer P      Created: Mon Jul 03, 2012 11:07 AM      Regarding: FW: Clinic patients       Please call patient and let her know that she needs to see her primary doctor for surgical clearance (see below message).  If patient has no primary care provider, please refer to [facility].      Thanks,      Tresa Endo      ----- Message -----         From: [place] L Presnell         Sent: 06/29/2012  11:44 AM           To: Juliette Mangle, RN, [facility]      Subject: Clinic patients                                          Ladies, can you start the medical clearance & financial assistance for this patient and let me know when to schedule her for surgery:)             Thanks            ----- Message -----         From: Tereso Newcomer, MD         Sent: 06/29/2012  10:26 AM           To: Cyprus L Presnell      Subject: Needs surgery - Spanish speaking only                    Hysteroscopy, D&C for complex atypical endometrial hyperplasia            Patient is morbidly obese, multiple health problems, needs medical clearance            Also needs ANESTHESIA appointment preoperatively, preferably about 1-2 weeks prior to surgery            Needs financial papers, but case is necessary (not elective) because of high risk of endometrial cancer            Thanks!            Vela Prose             ------

## 2012-07-06 NOTE — Telephone Encounter (Signed)
Called with Engineer, maintenance (IT) and spoke with a female who said he was her husband, asked him to have her call us at the clinic for some information- he also said they share the same phone number

## 2012-07-07 ENCOUNTER — Ambulatory Visit (HOSPITAL_COMMUNITY): Admission: RE | Admit: 2012-07-07 | Payer: Self-pay | Source: Ambulatory Visit

## 2012-07-19 ENCOUNTER — Emergency Department (INDEPENDENT_AMBULATORY_CARE_PROVIDER_SITE_OTHER)
Admission: EM | Admit: 2012-07-19 | Discharge: 2012-07-19 | Disposition: A | Discharge status: Home / Self Care | Payer: Self-pay | Source: Home / Self Care

## 2012-07-19 ENCOUNTER — Encounter (HOSPITAL_COMMUNITY): Payer: Self-pay

## 2012-07-19 DIAGNOSIS — J189 Pneumonia, unspecified organism: Secondary | ICD-10-CM

## 2012-07-19 DIAGNOSIS — R05 Cough: Secondary | ICD-10-CM

## 2012-07-19 MED ORDER — TRAMADOL HCL 50 MG PO TABS: 50.0000 mg | ORAL_TABLET | Freq: Four times a day (QID) | ORAL | Status: DC | PRN

## 2012-07-19 MED ORDER — AZITHROMYCIN 1 G PO PACK: 1.0000 | PACK | Freq: Once | ORAL | Status: DC

## 2012-07-19 NOTE — ED Provider Notes (Signed)
History     CSN: [id number]  Arrival date & time 07/19/12  1229   None     Chief Complaint  Patient presents with  . Emesis    (Consider location/radiation/quality/duration/timing/severity/associated sxs/prior treatment) HPI Patient is 27 year old female that presents with main concern of progressively worsening cough productive of yellow sputum associated with subjective fevers and chills. She denies chest pain or shortness of breath, denies recent sicknesses or hospitalizations, denies recent sick contacts. Patient denies abdominal or urinary concerns, no other systemic symptoms. Patient denies ear pain, no headaches or visual changes.  Past Medical History  Diagnosis Date  . Preterm labor     Past Surgical History  Procedure Laterality Date  . No past surgeries    . Cesarean section  04/05/2012    Procedure: CESAREAN SECTION;  Surgeon: Adam Phenix, MD;  Location: [facility];  Service: Obstetrics;  Laterality: N/A;  Primary Cesarean Section Delivery Girl @ [phone], Apgars 5/8    Family History  Problem Relation Age of Onset  . Diabetes Mother   . Diabetes Father   . Diabetes Sister     History  Substance Use Topics  . Smoking status: Never Smoker   . Smokeless tobacco: Never Used  . Alcohol Use: No    OB History   Grav Para Term Preterm Abortions TAB SAB Ect Mult Living   2 2 0 2 0 0 0 0 0 2       Review of Systems Review of Systems  Constitutional: Positive for fever, chills, negative for diaphoresis, activity change, appetite change and fatigue.  HENT: Negative for ear pain, nosebleeds, congestion, facial swelling, rhinorrhea, neck pain, neck stiffness and ear discharge.   Eyes: Negative for pain, discharge, redness, itching and visual disturbance.  Respiratory: Negative for choking, chest tightness, shortness of breath, wheezing and stridor.   Cardiovascular: Negative for chest pain, palpitations and leg swelling.  Gastrointestinal: Negative for abdominal  distention.  Genitourinary: Negative for dysuria, urgency, frequency, hematuria, flank pain, decreased urine volume, difficulty urinating and dyspareunia.  Musculoskeletal: Negative for back pain, joint swelling, arthralgias and gait problem.  Neurological: Negative for dizziness, tremors, seizures, syncope, facial asymmetry, speech difficulty, weakness, light-headedness, numbness and headaches.  Hematological: Negative for adenopathy. Does not bruise/bleed easily.  Psychiatric/Behavioral: Negative for hallucinations, behavioral problems, confusion, dysphoric mood, decreased concentration and agitation.    Allergies  Review of patient's allergies indicates no known allergies.  Home Medications   Current Outpatient Rx  Name  Route  Sig  Dispense  Refill  . Prenatal Vit-Fe Fumarate-FA (PRENATAL MULTIVITAMIN) TABS   Oral   Take 1 tablet by mouth daily.           Temp(Src) 97.9 F (36.6 C) (Oral)  Physical Exam Physical Exam  Constitutional: Appears well-developed and well-nourished. No distress.  HENT: Normocephalic. External right and left ear normal. Oropharynx is clear and moist.  Eyes: Conjunctivae and EOM are normal. PERRLA, no scleral icterus.  Neck: Normal ROM. Neck supple. No JVD. No tracheal deviation. No thyromegaly.  CVS: RRR, S1/S2 +, no murmurs, no gallops, no carotid bruit.  Pulmonary: Effort and breath sounds normal, no stridor, rhonchi, wheezes, rales.  Abdominal: Soft. BS +,  no distension, tenderness, rebound or guarding.  Musculoskeletal: Normal range of motion. No edema and no tenderness.  Lymphadenopathy: No lymphadenopathy noted, cervical, inguinal. Neuro: Alert. Normal reflexes, muscle tone coordination. No cranial nerve deficit. Skin: Skin is warm and dry. No rash noted. Not diaphoretic. No erythema. No pallor.  Psychiatric: Normal mood and affect. Behavior, judgment, thought content normal.   ED Course  Procedures (including critical care time)  Labs  Reviewed - No data to display No results found.   Productive cough of yellow sputum, acute bronchitis  - Possibly related to acute bronchitis given the fact that patient has subjective fevers and chest - Will treat with course of Zithromax - Will provide tramadol for symptomatic relief of pain if needed - Patient advised to wash hands regularly, patient also advised to come back and see if her symptoms persist or get worse   MDM  Acute bronchitis         Alison Murray, MD 07/19/12 1347

## 2012-07-19 NOTE — ED Notes (Signed)
Patient complains of vomiting and fever that started yesterday

## 2012-08-02 ENCOUNTER — Encounter: Payer: Self-pay | Admitting: *Deleted

## 2012-12-12 ENCOUNTER — Emergency Department (HOSPITAL_COMMUNITY)
Admission: EM | Admit: 2012-12-12 | Discharge: 2012-12-12 | Disposition: A | Discharge status: Home / Self Care | Payer: PRIVATE HEALTH INSURANCE | Source: Home / Self Care | Attending: Family Medicine | Admitting: Family Medicine

## 2012-12-12 ENCOUNTER — Encounter (HOSPITAL_COMMUNITY): Payer: Self-pay | Admitting: Emergency Medicine

## 2012-12-12 DIAGNOSIS — N390 Urinary tract infection, site not specified: Secondary | ICD-10-CM

## 2012-12-12 LAB — POCT URINALYSIS DIP (DEVICE)
Nitrite: NEGATIVE
Protein, ur: NEGATIVE mg/dL
Urobilinogen, UA: 0.2 mg/dL (ref 0.0–1.0)
pH: 7 (ref 5.0–8.0)

## 2012-12-12 MED ORDER — CEPHALEXIN 500 MG PO CAPS: 500.0000 mg | ORAL_CAPSULE | Freq: Four times a day (QID) | ORAL | Status: DC

## 2012-12-12 NOTE — ED Provider Notes (Signed)
CSN: [id number]     Arrival date & time 12/12/12  0920 History     First MD Initiated Contact with Patient 12/12/12 [phone]     Chief Complaint  Patient presents with  . Urinary Tract Infection    urgency frequency   (Consider location/radiation/quality/duration/timing/severity/associated sxs/prior Treatment) Patient is a 27 y.o. female presenting with urinary tract infection. The history is provided by the patient.  Urinary Tract Infection This is a new problem. The current episode started more than 1 week ago. The problem has been gradually worsening. Pertinent negatives include no abdominal pain and no shortness of breath.    Past Medical History  Diagnosis Date  . Preterm labor    Past Surgical History  Procedure Laterality Date  . No past surgeries    . Cesarean section  04/05/2012    Procedure: CESAREAN SECTION;  Surgeon: Adam Phenix, MD;  Location: [facility];  Service: Obstetrics;  Laterality: N/A;  Primary Cesarean Section Delivery Girl @ [phone], Apgars 5/8   Family History  Problem Relation Age of Onset  . Diabetes Mother   . Diabetes Father   . Diabetes Sister    History  Substance Use Topics  . Smoking status: Never Smoker   . Smokeless tobacco: Never Used  . Alcohol Use: No   OB History   Grav Para Term Preterm Abortions TAB SAB Ect Mult Living   2 2 0 2 0 0 0 0 0 2      Review of Systems  Constitutional: Negative.   Respiratory: Negative for shortness of breath.   Gastrointestinal: Negative.  Negative for abdominal pain.  Genitourinary: Positive for dysuria, urgency, frequency and pelvic pain. Negative for vaginal bleeding, vaginal discharge and menstrual problem.    Allergies  Review of patient's allergies indicates no known allergies.  Home Medications   Current Outpatient Rx  Name  Route  Sig  Dispense  Refill  . azithromycin (ZITHROMAX) 1 G powder   Oral   Take 1 packet by mouth once.   1 each   0   . cephALEXin (KEFLEX) 500 MG capsule    Oral   Take 1 capsule (500 mg total) by mouth 4 (four) times daily. Take all of medicine and drink lots of fluids   20 capsule   0   . Prenatal Vit-Fe Fumarate-FA (PRENATAL MULTIVITAMIN) TABS   Oral   Take 1 tablet by mouth daily.         . traMADol (ULTRAM) 50 MG tablet   Oral   Take 1 tablet (50 mg total) by mouth every 6 (six) hours as needed for pain.   30 tablet   0    BP 106/69  Pulse 70  Temp(Src) 97.9 F (36.6 C) (Oral)  Resp 18  SpO2 100%  LMP 11/30/2012  Breastfeeding? No Physical Exam  Nursing note and vitals reviewed. Constitutional: She is oriented to person, place, and time. She appears well-developed and well-nourished.  Abdominal: Soft. Bowel sounds are normal.  Neurological: She is alert and oriented to person, place, and time.  Skin: Skin is warm and dry.    ED Course   Procedures (including critical care time)  Labs Reviewed  POCT URINALYSIS DIP (DEVICE) - Abnormal; Notable for the following:    Hgb urine dipstick SMALL (*)    Leukocytes, UA MODERATE (*)    All other components within normal limits  POCT PREGNANCY, URINE   No results found. 1. UTI (lower urinary tract infection)  MDM    Linna Hoff, MD 12/12/12 1009

## 2012-12-12 NOTE — ED Notes (Signed)
Reports dysuria, urgency, frequency, with urinating. Symptoms present x 13 days. Pt has not taken any otc meds for symptoms. Denies fever and nausea.

## 2013-01-24 ENCOUNTER — Ambulatory Visit: Payer: PRIVATE HEALTH INSURANCE | Attending: Internal Medicine | Admitting: Internal Medicine

## 2013-01-24 ENCOUNTER — Encounter: Payer: Self-pay | Admitting: Internal Medicine

## 2013-01-24 VITALS — BP 104/69 | HR 65 | Temp 98.4°F | Resp 16 | Ht 59.84 in | Wt 116.0 lb

## 2013-01-24 DIAGNOSIS — N390 Urinary tract infection, site not specified: Secondary | ICD-10-CM

## 2013-01-24 DIAGNOSIS — R300 Dysuria: Secondary | ICD-10-CM | POA: Insufficient documentation

## 2013-01-24 MED ORDER — SULFAMETHOXAZOLE-TMP DS 800-160 MG PO TABS: 1.0000 | ORAL_TABLET | Freq: Two times a day (BID) | ORAL | Status: DC

## 2013-01-24 NOTE — Progress Notes (Signed)
Pt is here to establish care. Pt reports of painful/burning when she urinates. Also itching and discomfort. Pt also reports of having chills.

## 2013-01-24 NOTE — Progress Notes (Signed)
Patient Demographics  Alyssa Wallace, is a 27 y.o. female  [id number]  [id number]  DOB - 01-13-1986  Chief Complaint  Patient presents with  . Follow-up        Subjective:   Alyssa Wallace today is here to establish primary care. She was recently seen in the urgent care last month for dysuria. She was given cephalexin. She claims a day she didn't have gotten better. She is back here today complaining of pelvic pain along with dysuria. Her last menstrual period was on August 28. She also has frequency of urination.  Currently patient has no other complaints. Patient has also has No headache, No chest pain, No abdominal pain,No Nausea, No new weakness tingling or numbness, No Cough or SOB.  Objective:    Filed Vitals:   01/24/13 1417  BP: 104/69  Pulse: 65  Temp: 98.4 F (36.9 C)  TempSrc: Oral  Resp: 16  Height: 4' 11.84" (1.52 m)  Weight: 116 lb (52.617 kg)  SpO2: 97%     ALLERGIES:  No Known Allergies  PAST MEDICAL HISTORY: Past Medical History  Diagnosis Date  . Preterm labor     PAST SURGICAL HISTORY: Past Surgical History  Procedure Laterality Date  . No past surgeries    . Cesarean section  04/05/2012    Procedure: CESAREAN SECTION;  Surgeon: Adam Phenix, MD;  Location: [facility];  Service: Obstetrics;  Laterality: N/A;  Primary Cesarean Section Delivery Girl @ [phone], Apgars 5/8    FAMILY HISTORY: Family History  Problem Relation Age of Onset  . Diabetes Mother   . Diabetes Father   . Diabetes Sister     MEDICATIONS AT HOME: Prior to Admission medications   Medication Sig Start Date End Date Taking? Authorizing Provider  Prenatal Vit-Fe Fumarate-FA (PRENATAL MULTIVITAMIN) TABS Take 1 tablet by mouth daily.   Yes Historical Provider, MD  azithromycin (ZITHROMAX) 1 G powder Take 1 packet by mouth once. 07/19/12   Alison Murray, MD  cephALEXin (KEFLEX) 500 MG capsule Take 1 capsule (500 mg total) by mouth 4 (four) times  daily. Take all of medicine and drink lots of fluids 12/12/12   Linna Hoff, MD  traMADol (ULTRAM) 50 MG tablet Take 1 tablet (50 mg total) by mouth every 6 (six) hours as needed for pain. 07/19/12   Alison Murray, MD    SOCIAL HISTORY:   reports that she has never smoked. She has never used smokeless tobacco. She reports that she does not drink alcohol or use illicit drugs.  REVIEW OF SYSTEMS:  Constitutional:   No   Fevers, chills, fatigue.  HEENT:    No headaches, Sore throat,   Cardio-vascular: No chest pain,  Orthopnea, swelling in lower extremities, anasarca, palpitations  GI:  No abdominal pain, nausea, vomiting, diarrhea  Resp: No shortness of breath,  No coughing up of blood.No cough.No wheezing.  Skin:  no rash or lesions.  GU:  no change in color of urine, no urgency.  No flank pain.  Musculoskeletal: No joint pain or swelling.  No decreased range of motion.  No back pain.  Psych: No change in mood or affect. No depression or anxiety.  No memory loss.   Exam  General appearance :Awake, alert, not in any distress. Speech Clear. Not toxic Looking HEENT: Atraumatic and Normocephalic, pupils equally reactive to light and accomodation Neck: supple, no JVD. No cervical lymphadenopathy.  Chest:Good air entry bilaterally, no added sounds  CVS: S1 S2 regular, no  murmurs.  Abdomen: Bowel sounds present, very minimal tenderness in the pelvic area without any rebound rigidity or guarding. Extremities: B/L Lower Ext shows no edema, both legs are warm to touch Neurology: Awake alert, and oriented X 3, CN II-XII intact, Non focal Skin:No Rash Wounds:N/A    Data Review   CBC No results found for this basename: WBC, HGB, HCT, PLT, MCV, MCH, MCHC, RDW, NEUTRABS, LYMPHSABS, MONOABS, EOSABS, BASOSABS, BANDABS, BANDSABD,  in the last 168 hours  Chemistries   No results found for this basename: NA, K, CL, CO2, GLUCOSE, BUN, CREATININE, GFRCGP, CALCIUM, MG, AST, ALT,  ALKPHOS, BILITOT,  in the last 168 hours ------------------------------------------------------------------------------------------------------------------ No results found for this basename: HGBA1C,  in the last 72 hours ------------------------------------------------------------------------------------------------------------------ No results found for this basename: CHOL, HDL, LDLCALC, TRIG, CHOLHDL, LDLDIRECT,  in the last 72 hours ------------------------------------------------------------------------------------------------------------------ No results found for this basename: TSH, T4TOTAL, FREET3, T3FREE, THYROIDAB,  in the last 72 hours ------------------------------------------------------------------------------------------------------------------ No results found for this basename: VITAMINB12, FOLATE, FERRITIN, TIBC, IRON, RETICCTPCT,  in the last 72 hours  Coagulation profile  No results found for this basename: INR, PROTIME,  in the last 168 hours    Assessment & Plan   Suspected UTI - Check UA, urine pregnancy test and send for urine culture as this is the second episode - Start Bactrim- treat for 3 days. - If her symptoms continue, she should follow up in one week, otherwise we can see her back in the next 3 months, for general health maintenance issues.  Follow up in one week  The patient was given clear instructions to go to ER or return to medical center if symptoms don't improve, worsen or new problems develop. The patient verbalized understanding. The patient was told to call to get lab results if they haven't heard anything in the next week.

## 2013-01-30 ENCOUNTER — Ambulatory Visit: Payer: PRIVATE HEALTH INSURANCE | Attending: Internal Medicine | Admitting: Internal Medicine

## 2013-01-30 VITALS — BP 102/72 | HR 64 | Temp 98.5°F | Resp 18 | Wt 115.2 lb

## 2013-01-30 DIAGNOSIS — N390 Urinary tract infection, site not specified: Secondary | ICD-10-CM

## 2013-01-30 NOTE — Progress Notes (Signed)
Patient ID: Alyssa Wallace, female   DOB: [birth year], 27 y.o.   MRN: [id number] Patient Demographics  Alyssa Wallace, is a 27 y.o. female  [id number]  [id number]  DOB - Sep 08, 1985  Chief Complaint  Patient presents with  . Follow-up        Subjective:   Alyssa Wallace is a 27 y.o. female here today for a follow up visit. She will let us know the results of her urine and other tests. She was recently seen for UTI, on Bactrim, she claimed the medication is helping, symptoms have resolved. Patient has No headache, No chest pain, No abdominal pain - No Nausea, No new weakness tingling or numbness, No Cough - SOB.  ALLERGIES: No Known Allergies  PAST MEDICAL HISTORY: Past Medical History  Diagnosis Date  . Preterm labor     MEDICATIONS AT HOME: Prior to Admission medications   Medication Sig Start Date End Date Taking? Authorizing Provider  azithromycin (ZITHROMAX) 1 G powder Take 1 packet by mouth once. 07/19/12   Alison Murray, MD  cephALEXin (KEFLEX) 500 MG capsule Take 1 capsule (500 mg total) by mouth 4 (four) times daily. Take all of medicine and drink lots of fluids 12/12/12   Linna Hoff, MD  Prenatal Vit-Fe Fumarate-FA (PRENATAL MULTIVITAMIN) TABS Take 1 tablet by mouth daily.    Historical Provider, MD  sulfamethoxazole-trimethoprim (BACTRIM DS) 800-160 MG per tablet Take 1 tablet by mouth 2 (two) times daily. 01/24/13   Shanker Levora Dredge, MD  traMADol (ULTRAM) 50 MG tablet Take 1 tablet (50 mg total) by mouth every 6 (six) hours as needed for pain. 07/19/12   Alison Murray, MD     Objective:   Filed Vitals:   01/30/13 1825  BP: 102/72  Pulse: 64  Temp: 98.5 F (36.9 C)  Resp: 18  Weight: 115 lb 3.2 oz (52.254 kg)  SpO2: 100%    Exam General appearance : Awake, alert, not in any distress. Speech Clear. Not toxic looking HEENT: Atraumatic and Normocephalic, pupils equally reactive to light and accomodation Neck: supple, no JVD.  No cervical lymphadenopathy.  Chest:Good air entry bilaterally, no added sounds  CVS: S1 S2 regular, no murmurs.  Abdomen: Bowel sounds present, Non tender and not distended with no gaurding, rigidity or rebound. Extremities: B/L Lower Ext shows no edema, both legs are warm to touch Neurology: Awake alert, and oriented X 3, CN II-XII intact, Non focal Skin:No Rash Wounds:N/A   Data Review   CBC No results found for this basename: WBC, HGB, HCT, PLT, MCV, MCH, MCHC, RDW, NEUTRABS, LYMPHSABS, MONOABS, EOSABS, BASOSABS, BANDABS, BANDSABD,  in the last 168 hours  Chemistries   No results found for this basename: NA, K, CL, CO2, GLUCOSE, BUN, CREATININE, GFRCGP, CALCIUM, MG, AST, ALT, ALKPHOS, BILITOT,  in the last 168 hours ------------------------------------------------------------------------------------------------------------------ No results found for this basename: HGBA1C,  in the last 72 hours ------------------------------------------------------------------------------------------------------------------ No results found for this basename: CHOL, HDL, LDLCALC, TRIG, CHOLHDL, LDLDIRECT,  in the last 72 hours ------------------------------------------------------------------------------------------------------------------ No results found for this basename: TSH, T4TOTAL, FREET3, T3FREE, THYROIDAB,  in the last 72 hours ------------------------------------------------------------------------------------------------------------------ No results found for this basename: VITAMINB12, FOLATE, FERRITIN, TIBC, IRON, RETICCTPCT,  in the last 72 hours  Coagulation profile  No results found for this basename: INR, PROTIME,  in the last 168 hours    Assessment & Plan   There are no active problems to display for this patient.    Plan: Results of laboratory investigations discussed  with patient Patient counseled about nutrition and exercise Patient was encouraged to continue  medications as prescribed  Follow up in 3 months or when necessary   The patient was given clear instructions to go to ER or return to medical center if symptoms don't improve, worsen or new problems develop. The patient verbalized understanding. The patient was told to call to get lab results if they haven't heard anything in the next week.    Jeanann Lewandowsky, MD, MHA, FACP, FAAP [facility] [place], [place] [phone]   01/30/2013, 7:12 PM

## 2013-01-30 NOTE — Progress Notes (Signed)
Patient here for follow up and lab results. ?

## 2013-04-27 ENCOUNTER — Emergency Department (HOSPITAL_COMMUNITY): Payer: PRIVATE HEALTH INSURANCE

## 2013-04-27 ENCOUNTER — Ambulatory Visit: Payer: PRIVATE HEALTH INSURANCE | Attending: Internal Medicine | Admitting: Internal Medicine

## 2013-04-27 ENCOUNTER — Emergency Department (HOSPITAL_COMMUNITY)
Admission: EM | Admit: 2013-04-27 | Discharge: 2013-04-27 | Disposition: A | Discharge status: Home / Self Care | Payer: PRIVATE HEALTH INSURANCE | Attending: Emergency Medicine | Admitting: Emergency Medicine

## 2013-04-27 ENCOUNTER — Encounter (HOSPITAL_COMMUNITY): Payer: Self-pay | Admitting: Emergency Medicine

## 2013-04-27 VITALS — BP 106/69 | HR 72 | Temp 98.6°F | Resp 14 | Ht 60.0 in | Wt 115.4 lb

## 2013-04-27 DIAGNOSIS — R1033 Periumbilical pain: Secondary | ICD-10-CM | POA: Insufficient documentation

## 2013-04-27 DIAGNOSIS — R109 Unspecified abdominal pain: Secondary | ICD-10-CM

## 2013-04-27 DIAGNOSIS — Z3202 Encounter for pregnancy test, result negative: Secondary | ICD-10-CM | POA: Insufficient documentation

## 2013-04-27 LAB — CBC WITH DIFFERENTIAL/PLATELET
Basophils Absolute: 0 10*3/uL (ref 0.0–0.1)
Eosinophils Absolute: 0.1 10*3/uL (ref 0.0–0.7)
Eosinophils Relative: 1 % (ref 0–5)
HCT: 39.5 % (ref 36.0–46.0)
Lymphocytes Relative: 37 % (ref 12–46)
MCH: 33.7 pg (ref 26.0–34.0)
MCHC: 35.4 g/dL (ref 30.0–36.0)
MCV: 95 fL (ref 78.0–100.0)
Monocytes Absolute: 0.5 10*3/uL (ref 0.1–1.0)
Platelets: 226 10*3/uL (ref 150–400)
RDW: 12.4 % (ref 11.5–15.5)
WBC: 6.2 10*3/uL (ref 4.0–10.5)

## 2013-04-27 LAB — LIPASE, BLOOD: Lipase: 32 U/L (ref 11–59)

## 2013-04-27 LAB — URINALYSIS, ROUTINE W REFLEX MICROSCOPIC
Bilirubin Urine: NEGATIVE
Hgb urine dipstick: NEGATIVE
Ketones, ur: NEGATIVE mg/dL
Nitrite: NEGATIVE
pH: 6.5 (ref 5.0–8.0)

## 2013-04-27 LAB — COMPREHENSIVE METABOLIC PANEL
AST: 25 U/L (ref 0–37)
CO2: 24 mEq/L (ref 19–32)
Calcium: 8.9 mg/dL (ref 8.4–10.5)
Creatinine, Ser: 0.65 mg/dL (ref 0.50–1.10)
GFR calc Af Amer: >90 mL/min (ref >90)
GFR calc non Af Amer: >90 mL/min (ref >90)
Total Protein: 7.1 g/dL (ref 6.0–8.3)

## 2013-04-27 LAB — CG4 I-STAT (LACTIC ACID): Lactic Acid, Venous: 0.66 mmol/L (ref 0.5–2.2)

## 2013-04-27 LAB — URINE MICROSCOPIC-ADD ON

## 2013-04-27 LAB — PREGNANCY, URINE: Preg Test, Ur: NEGATIVE

## 2013-04-27 MED ORDER — IOHEXOL 300 MG/ML  SOLN
100.0000 mL | Freq: Once | INTRAMUSCULAR | Status: AC | PRN
  Administered 2013-04-27: 100 mL via INTRAVENOUS

## 2013-04-27 NOTE — Progress Notes (Signed)
Patient ID: Alyssa Wallace, female   DOB: 1985/08/20, 27 y.o.   MRN: [id number]   CC:  HPI: 27 year old female presents with acute abdomen. She complains of peri-umbilical pain for the last 8 days. She does complain of mild nausea but no vomiting. She states that her bowel movements are regular. The pain was initially periumbilical but subsequently started radiating to the right lower quadrant She also had a fever low grade 3 days ago. She denies any hematochezia or melena. She denies any urinary symptoms of urgency frequency or dysuria  She states that her pain has been so painful, that she has been unable to carry her daughter. The pain was fairly sudden in onset. She does not remember any trauma she does not remember eating certain foods that may have precipitated the pain.    No Known Allergies Past Medical History  Diagnosis Date  . Preterm labor    Current Outpatient Prescriptions on File Prior to Visit  Medication Sig Dispense Refill  . Prenatal Vit-Fe Fumarate-FA (PRENATAL MULTIVITAMIN) TABS Take 1 tablet by mouth daily.      Marland Kitchen azithromycin (ZITHROMAX) 1 G powder Take 1 packet by mouth once.  1 each  0  . cephALEXin (KEFLEX) 500 MG capsule Take 1 capsule (500 mg total) by mouth 4 (four) times daily. Take all of medicine and drink lots of fluids  20 capsule  0  . sulfamethoxazole-trimethoprim (BACTRIM DS) 800-160 MG per tablet Take 1 tablet by mouth 2 (two) times daily.  10 tablet  0  . traMADol (ULTRAM) 50 MG tablet Take 1 tablet (50 mg total) by mouth every 6 (six) hours as needed for pain.  30 tablet  0   No current facility-administered medications on file prior to visit.   Family History  Problem Relation Age of Onset  . Diabetes Mother   . Diabetes Father   . Diabetes Sister    History   Social History  . Marital Status: Single    Spouse Name: N/A    Number of Children: N/A  . Years of Education: N/A   Occupational History  . Not on file.   Social  History Main Topics  . Smoking status: Never Smoker   . Smokeless tobacco: Never Used  . Alcohol Use: No  . Drug Use: No  . Sexual Activity: Not Currently    Birth Control/ Protection: Condom   Other Topics Concern  . Not on file   Social History Narrative  . No narrative on file    Review of Systems  Constitutional: Negative for fever, chills, diaphoresis, activity change, appetite change and fatigue.  HENT: Negative for ear pain, nosebleeds, congestion, facial swelling, rhinorrhea, neck pain, neck stiffness and ear discharge.   Eyes: Negative for pain, discharge, redness, itching and visual disturbance.  Respiratory: Negative for cough, choking, chest tightness, shortness of breath, wheezing and stridor.   Cardiovascular: Negative for chest pain, palpitations and leg swelling.  Gastrointestinal: Negative for abdominal distention.  Genitourinary: Negative for dysuria, urgency, frequency, hematuria, flank pain, decreased urine volume, difficulty urinating and dyspareunia.  Musculoskeletal: Negative for back pain, joint swelling, arthralgias and gait problem.  Neurological: Negative for dizziness, tremors, seizures, syncope, facial asymmetry, speech difficulty, weakness, light-headedness, numbness and headaches.  Hematological: Negative for adenopathy. Does not bruise/bleed easily.  Psychiatric/Behavioral: Negative for hallucinations, behavioral problems, confusion, dysphoric mood, decreased concentration and agitation.    Objective:   Filed Vitals:   04/27/13 1039  BP: 106/69  Pulse: 72  Temp: 98.6  F (37 C)  Resp: 14    Physical Exam  Constitutional: Appears well-developed and well-nourished. No distress.  HENT: Normocephalic. External right and left ear normal. Oropharynx is clear and moist.  Eyes: Conjunctivae and EOM are normal. PERRLA, no scleral icterus.  Neck: Normal ROM. Neck supple. No JVD. No tracheal deviation. No thyromegaly.  CVS: RRR, S1/S2 +, no murmurs, no  gallops, no carotid bruit.  Pulmonary: Effort and breath sounds normal, no stridor, rhonchi, wheezes, rales.  Abdominal: Soft. BS +,  no distension, tenderness, rebound or guarding.  Musculoskeletal: Normal range of motion. No edema and no tenderness.  Lymphadenopathy: No lymphadenopathy noted, cervical, inguinal. Neuro: Alert. Normal reflexes, muscle tone coordination. No cranial nerve deficit. Skin: Skin is warm and dry. No rash noted. Not diaphoretic. No erythema. No pallor.  Psychiatric: Normal mood and affect. Behavior, judgment, thought content normal.   Lab Results  Component Value Date   WBC 7.3 04/06/2012   HGB 11.1* 04/06/2012   HCT 32.8* 04/06/2012   MCV 96.5 04/06/2012   PLT 184 04/06/2012   Lab Results  Component Value Date   CREATININE 0.48* 07/19/2011   BUN 11 07/19/2011   NA 138 07/19/2011   K 3.4* 07/19/2011   CL 108 07/19/2011   CO2 20 07/19/2011    No results found for this basename: HGBA1C   Lipid Panel  No results found for this basename: chol, trig, hdl, cholhdl, vldl, ldlcalc       Assessment and plan:   There are no active problems to display for this patient.      Acute abdomen Her abdomen is soft without rebound or guarding Given the severity of the pain and fever the patient would benefit from an urgent CT scan of the abdomen and pelvis to rule out appendicitis Patient is being referred to the ED Please note that the patient has a language Spanish interpreter She has been given directions to the ER registration She will need CMP, lipase, UA, blood cultures, pregnancy test, CT abdomen pelvis No workup is being initiated in the clinic because of the intensity of her pain.   she can follow back with [place] in one month    The patient was given clear instructions to go to ER or return to medical center if symptoms don't improve, worsen or new problems develop. The patient verbalized understanding. The patient was told to call to get any lab results if  not heard anything in the next week.

## 2013-04-27 NOTE — ED Notes (Signed)
Called CT to inform them that pt finished drinking both cups of contrast.

## 2013-04-27 NOTE — Progress Notes (Signed)
Pt being transferred to ER via self Need Abdominal pain work up with CT Called nurse first but told they are busy at the moment Will call with report

## 2013-04-27 NOTE — ED Notes (Signed)
Pt called in main ED waiting area with no response 

## 2013-04-27 NOTE — ED Notes (Signed)
With use of interpretor phone, pt reports umbilical pain for 8 days. Denies nausea, vomiting, diarrhea or constipation.

## 2013-04-27 NOTE — ED Provider Notes (Signed)
CSN: [id number]     Arrival date & time 04/27/13  1122 History   First MD Initiated Contact with Patient 04/27/13 1516     Chief Complaint  Patient presents with  . Abdominal Pain   Patient is a 27 y.o. female presenting with abdominal pain. The history is provided by the patient. The history is limited by a language barrier. A language interpreter was used.  Abdominal Pain Pain location:  Periumbilical Pain quality: sharp   Pain radiates to:  Does not radiate Pain severity:  Moderate Onset quality:  Gradual Duration:  8 days Timing:  Constant Progression:  Waxing and waning Chronicity:  New Relieved by:  Nothing Worsened by:  Eating Ineffective treatments:  None tried Associated symptoms: no chills, no dysuria, no fever, no nausea, no vaginal bleeding, no vaginal discharge and no vomiting     Past Medical History  Diagnosis Date  . Preterm labor    Past Surgical History  Procedure Laterality Date  . No past surgeries    . Cesarean section  04/05/2012    Procedure: CESAREAN SECTION;  Surgeon: Adam Phenix, MD;  Location: [facility];  Service: Obstetrics;  Laterality: N/A;  Primary Cesarean Section Delivery Girl @ [phone], Apgars 5/8   Family History  Problem Relation Age of Onset  . Diabetes Mother   . Diabetes Father   . Diabetes Sister    History  Substance Use Topics  . Smoking status: Never Smoker   . Smokeless tobacco: Never Used  . Alcohol Use: No   OB History   Grav Para Term Preterm Abortions TAB SAB Ect Mult Living   2 2 0 2 0 0 0 0 0 2      Review of Systems  Constitutional: Negative for fever and chills.  Gastrointestinal: Positive for abdominal pain. Negative for nausea and vomiting.  Genitourinary: Negative for dysuria, frequency, vaginal bleeding and vaginal discharge.  All other systems reviewed and are negative.    Allergies  Review of patient's allergies indicates no known allergies.  Home Medications   Current Outpatient Rx  Name  Route   Sig  Dispense  Refill  . ibuprofen (ADVIL,MOTRIN) 200 MG tablet   Oral   Take 200 mg by mouth every 8 (eight) hours as needed for moderate pain.         . Prenatal Vit-Fe Fumarate-FA (PRENATAL MULTIVITAMIN) TABS   Oral   Take 1 tablet by mouth daily.          BP 102/63  Pulse 66  Temp(Src) 97.7 F (36.5 C) (Oral)  Resp 18  Wt 114 lb 7 oz (51.909 kg)  SpO2 99%  LMP 04/08/2013 Physical Exam  Nursing note and vitals reviewed. Constitutional: She is oriented to person, place, and time. She appears well-developed and well-nourished. No distress.  HENT:  Head: Normocephalic and atraumatic.  Eyes: Conjunctivae are normal. Pupils are equal, round, and reactive to light.  Neck: Normal range of motion. Neck supple.  Cardiovascular: Normal rate and regular rhythm.  Exam reveals no gallop and no friction rub.   No murmur heard. Pulmonary/Chest: Effort normal and breath sounds normal.  Abdominal: Soft. She exhibits no distension. There is tenderness (mild) in the periumbilical area. A hernia (small reducible umbilical hernia with no overlying redness or warmth) is present. Hernia confirmed positive in the ventral area.  Musculoskeletal: Normal range of motion. She exhibits no edema and no tenderness.  Neurological: She is alert and oriented to person, place, and time. She has  normal strength and normal reflexes. No cranial nerve deficit or sensory deficit.  Skin: Skin is warm and dry.  Psychiatric: She has a normal mood and affect.    ED Course  Procedures (including critical care time) Labs Review Labs Reviewed  COMPREHENSIVE METABOLIC PANEL - Abnormal; Notable for the following:    Glucose, Bld 104 (*)    All other components within normal limits  URINALYSIS, ROUTINE W REFLEX MICROSCOPIC - Abnormal; Notable for the following:    Leukocytes, UA TRACE (*)    All other components within normal limits  CBC WITH DIFFERENTIAL  LIPASE, BLOOD  URINE MICROSCOPIC-ADD ON  PREGNANCY,  URINE  CG4 I-STAT (LACTIC ACID)   Imaging Review Ct Abdomen Pelvis W Contrast  04/27/2013   CLINICAL DATA:  Periumbilical pain, fever.  EXAM: CT ABDOMEN AND PELVIS WITH CONTRAST  TECHNIQUE: Multidetector CT imaging of the abdomen and pelvis was performed using the standard protocol following bolus administration of intravenous contrast.  CONTRAST:  OMNIPAQUE IOHEXOL 300 MG/ML  SOLN  COMPARISON:  None.  FINDINGS: Minimal dependent atelectasis in the visualized lung bases. Unremarkable liver, gallbladder, spleen, adrenal glands, kidneys, pancreas, aorta, portal vein. Stomach, small bowel, and colon are nondilated. Normal appendix. Urinary bladder physiologically distended. Uterus and adnexal regions unremarkable. No ascites. No free air. No adenopathy. Normal bilateral renal excretion into decompressed collecting systems on delayed scans. Lumbar spine intact.  IMPRESSION: 1.  No acute abdominal process.  Normal appendix.   Electronically Signed   By: Oley Balm M.D.   On: 04/27/2013 19:38    EKG Interpretation   None       MDM   Here with 8 days of abdominal pain. Mild pain at umbilicus. Small reducible hernia. Afebrile. Relatively benign abdominal exam. Labs and urine unremarkable. CT negative. Pain may be from small umbilical hernia. Follow up with pcp. With continued symptoms may need referal to general surgery.   1. Abdominal pain        Shanon Ace, MD 04/28/13 [phone]

## 2013-04-27 NOTE — ED Provider Notes (Signed)
I saw and evaluated the patient, reviewed the resident's note and I agree with the findings and plan.  EKG Interpretation   None      . Results for orders placed during the hospital encounter of 04/27/13  CBC WITH DIFFERENTIAL      Result Value Range   WBC 6.2  4.0 - 10.5 K/uL   RBC 4.16  3.87 - 5.11 MIL/uL   Hemoglobin 14.0  12.0 - 15.0 g/dL   HCT 40.9  81.1 - 91.4 %   MCV 95.0  78.0 - 100.0 fL   MCH 33.7  26.0 - 34.0 pg   MCHC 35.4  30.0 - 36.0 g/dL   RDW 78.2  95.6 - 21.3 %   Platelets 226  150 - 400 K/uL   Neutrophils Relative % 55  43 - 77 %   Neutro Abs 3.4  1.7 - 7.7 K/uL   Lymphocytes Relative 37  12 - 46 %   Lymphs Abs 2.3  0.7 - 4.0 K/uL   Monocytes Relative 8  3 - 12 %   Monocytes Absolute 0.5  0.1 - 1.0 K/uL   Eosinophils Relative 1  0 - 5 %   Eosinophils Absolute 0.1  0.0 - 0.7 K/uL   Basophils Relative 0  0 - 1 %   Basophils Absolute 0.0  0.0 - 0.1 K/uL  COMPREHENSIVE METABOLIC PANEL      Result Value Range   Sodium 139  135 - 145 mEq/L   Potassium 3.9  3.5 - 5.1 mEq/L   Chloride 104  96 - 112 mEq/L   CO2 24  19 - 32 mEq/L   Glucose, Bld 104 (*) 70 - 99 mg/dL   BUN 13  6 - 23 mg/dL   Creatinine, Ser 0.86  0.50 - 1.10 mg/dL   Calcium 8.9  8.4 - 57.8 mg/dL   Total Protein 7.1  6.0 - 8.3 g/dL   Albumin 4.0  3.5 - 5.2 g/dL   AST 25  0 - 37 U/L   ALT 17  0 - 35 U/L   Alkaline Phosphatase 63  39 - 117 U/L   Total Bilirubin 0.3  0.3 - 1.2 mg/dL   GFR calc non Af Amer >90  >90 mL/min   GFR calc Af Amer >90  >90 mL/min  LIPASE, BLOOD      Result Value Range   Lipase 32  11 - 59 U/L  URINALYSIS, ROUTINE W REFLEX MICROSCOPIC      Result Value Range   Color, Urine YELLOW  YELLOW   APPearance CLEAR  CLEAR   Specific Gravity, Urine 1.019  1.005 - 1.030   pH 6.5  5.0 - 8.0   Glucose, UA NEGATIVE  NEGATIVE mg/dL   Hgb urine dipstick NEGATIVE  NEGATIVE   Bilirubin Urine NEGATIVE  NEGATIVE   Ketones, ur NEGATIVE  NEGATIVE mg/dL   Protein, ur NEGATIVE  NEGATIVE  mg/dL   Urobilinogen, UA 0.2  0.0 - 1.0 mg/dL   Nitrite NEGATIVE  NEGATIVE   Leukocytes, UA TRACE (*) NEGATIVE  URINE MICROSCOPIC-ADD ON      Result Value Range   Squamous Epithelial / LPF RARE  RARE   WBC, UA 0-2  <3 WBC/hpf   Bacteria, UA RARE  RARE  PREGNANCY, URINE      Result Value Range   Preg Test, Ur NEGATIVE  NEGATIVE  CG4 I-STAT (LACTIC ACID)      Result Value Range   Lactic Acid, Venous 0.66  0.5 - 2.2 mmol/L   Ct Abdomen Pelvis W Contrast  04/27/2013   CLINICAL DATA:  Periumbilical pain, fever.  EXAM: CT ABDOMEN AND PELVIS WITH CONTRAST  TECHNIQUE: Multidetector CT imaging of the abdomen and pelvis was performed using the standard protocol following bolus administration of intravenous contrast.  CONTRAST:  OMNIPAQUE IOHEXOL 300 MG/ML  SOLN  COMPARISON:  None.  FINDINGS: Minimal dependent atelectasis in the visualized lung bases. Unremarkable liver, gallbladder, spleen, adrenal glands, kidneys, pancreas, aorta, portal vein. Stomach, small bowel, and colon are nondilated. Normal appendix. Urinary bladder physiologically distended. Uterus and adnexal regions unremarkable. No ascites. No free air. No adenopathy. Normal bilateral renal excretion into decompressed collecting systems on delayed scans. Lumbar spine intact.  IMPRESSION: 1.  No acute abdominal process.  Normal appendix.   Electronically Signed   By: Oley Balm M.D.   On: 04/27/2013 19:38    Patient referred to the [facility] to rule out the appendicitis. Clinically patient's exam low suspicion for appendicitis but would proceed with a CT scan as directed by her primary care physician. Workup here showed no evidence of CT scan and patient can be discharged home.    Shelda Jakes, MD 04/27/13 2021

## 2013-04-27 NOTE — ED Notes (Signed)
Called for triage. No answer at this time.

## 2013-04-27 NOTE — Progress Notes (Signed)
Pt is here for a f/u. Complains of pain umbilical pain around abdomen x8 days; very little eating on Monday. If pt eats a lot, stomach feels swollen. Experienced a fever on Monday and could not walk. Pt has an interpreter.

## 2013-04-27 NOTE — ED Notes (Signed)
Pt to department from the [facility]. Reports that she has been having abd pain and pressure behind her belly button for a couple of days. States that she was sent here for a CT scan.

## 2013-05-02 NOTE — ED Provider Notes (Signed)
I saw and evaluated the patient, reviewed the resident's note and I agree with the findings and plan.  EKG Interpretation   None         Shelda Jakes, MD 05/02/13 [phone]

## 2013-05-07 ENCOUNTER — Ambulatory Visit: Payer: PRIVATE HEALTH INSURANCE | Admitting: Internal Medicine

## 2014-01-08 ENCOUNTER — Ambulatory Visit: Payer: Self-pay | Attending: Internal Medicine | Admitting: Internal Medicine

## 2014-01-08 ENCOUNTER — Encounter: Payer: Self-pay | Admitting: Internal Medicine

## 2014-01-08 VITALS — BP 113/77 | HR 66 | Temp 98.3°F | Resp 16 | Ht 59.0 in | Wt 113.0 lb

## 2014-01-08 DIAGNOSIS — R209 Unspecified disturbances of skin sensation: Secondary | ICD-10-CM | POA: Insufficient documentation

## 2014-01-08 DIAGNOSIS — R202 Paresthesia of skin: Secondary | ICD-10-CM

## 2014-01-08 DIAGNOSIS — Z23 Encounter for immunization: Secondary | ICD-10-CM | POA: Insufficient documentation

## 2014-01-08 LAB — POCT GLYCOSYLATED HEMOGLOBIN (HGB A1C): Hemoglobin A1C: 5.4

## 2014-01-08 LAB — GLUCOSE, POCT (MANUAL RESULT ENTRY): POC GLUCOSE: 115 mg/dL — AB (ref 70–99)

## 2014-01-08 NOTE — Progress Notes (Signed)
Patient ID: Alyssa Wallace, female   DOB: 07/02/1985, 28 y.o.   MRN: [id number]  CC: tingling in legs  HPI:  Patient presents to clinic today for pain in her politeal area that radiates to calf and sole of left foot.  She reports that it has been affecting her mobility for one month.  She reports that it is a numbness and tingling sensation.  She denies injury or back pain. She denies the 3 P's, injury, edema, redness, warmth, or back pain.  Patient does have family history of DM.  Patient is currently breast feeding.   No Known Allergies Past Medical History  Diagnosis Date  . Preterm labor    Current Outpatient Prescriptions on File Prior to Visit  Medication Sig Dispense Refill  . ibuprofen (ADVIL,MOTRIN) 200 MG tablet Take 200 mg by mouth every 8 (eight) hours as needed for moderate pain.      . Prenatal Vit-Fe Fumarate-FA (PRENATAL MULTIVITAMIN) TABS Take 1 tablet by mouth daily.       No current facility-administered medications on file prior to visit.   Family History  Problem Relation Age of Onset  . Diabetes Mother   . Diabetes Father   . Diabetes Sister    History   Social History  . Marital Status: Single    Spouse Name: N/A    Number of Children: N/A  . Years of Education: N/A   Occupational History  . Not on file.   Social History Main Topics  . Smoking status: Never Smoker   . Smokeless tobacco: Never Used  . Alcohol Use: No  . Drug Use: No  . Sexual Activity: Not Currently    Birth Control/ Protection: Condom   Other Topics Concern  . Not on file   Social History Narrative  . No narrative on file    Review of Systems: Constitutional: Negative for fever, chills, diaphoresis, activity change, appetite change and fatigue. HENT: Negative for ear pain, nosebleeds, congestion, facial swelling, rhinorrhea, neck pain, neck stiffness and ear discharge.  Eyes: Negative for pain, discharge, redness, itching and visual disturbance. Respiratory: Negative  for cough, choking, chest tightness, shortness of breath, wheezing and stridor.  Cardiovascular: Negative for chest pain, palpitations and leg swelling. Gastrointestinal: Negative for abdominal distention. Genitourinary: Negative for dysuria, urgency, frequency, hematuria, flank pain, decreased urine volume, difficulty urinating and dyspareunia.  Musculoskeletal: Negative for back pain, joint swelling, arthralgias and gait problem. Neurological: Negative for dizziness, tremors, seizures, syncope, facial asymmetry, speech difficulty, weakness, light-headedness, numbness and headaches.  Hematological: Negative for adenopathy. Does not bruise/bleed easily. Psychiatric/Behavioral: Negative for hallucinations, behavioral problems, confusion, dysphoric mood, decreased concentration and agitation.    Objective:   Filed Vitals:   01/08/14 1740  BP: 113/77  Pulse: 66  Temp: 98.3 F (36.8 C)  Resp: 16    Physical Exam  Constitutional: She is oriented to person, place, and time.  Cardiovascular: Normal rate and regular rhythm.   Pulmonary/Chest: Effort normal and breath sounds normal.  Musculoskeletal: Normal range of motion. She exhibits no edema and no tenderness.  Neurological: She is alert and oriented to person, place, and time. She has normal reflexes.     Lab Results  Component Value Date   WBC 6.2 04/27/2013   HGB 14.0 04/27/2013   HCT 39.5 04/27/2013   MCV 95.0 04/27/2013   PLT 226 04/27/2013   Lab Results  Component Value Date   CREATININE 0.65 04/27/2013   BUN 13 04/27/2013   NA 139 04/27/2013  K 3.9 04/27/2013   CL 104 04/27/2013   CO2 24 04/27/2013    No results found for this basename: HGBA1C   Lipid Panel  No results found for this basename: chol, trig, hdl, cholhdl, vldl, ldlcalc       Assessment and plan:   Alyssa Wallace was seen today for follow-up.  Diagnoses and associated orders for this visit:  Tingling in extremities - POCT glucose (manual  entry) - POCT glycosylated hemoglobin (Hb A1C) 5.4 Patient will use OTC ibuprofen for now, may use different agent if she stops breast feeding.   Need for prophylactic vaccination and inoculation against influenza Received flu vaccine  Return if symptoms worsen or fail to improve.   Chari Manning, NP-C [facility] [phone] 01/08/2014, 5:49 PM

## 2014-01-08 NOTE — Progress Notes (Signed)
Pt is here having pain, numbness and tingling in her left leg that now is mostly in the bottom of her left foot.

## 2014-03-11 ENCOUNTER — Encounter: Payer: Self-pay | Admitting: Internal Medicine

## 2015-03-21 ENCOUNTER — Inpatient Hospital Stay (HOSPITAL_COMMUNITY): Payer: Self-pay

## 2015-03-21 ENCOUNTER — Inpatient Hospital Stay (HOSPITAL_COMMUNITY)
Admission: AD | Admit: 2015-03-21 | Discharge: 2015-03-21 | Disposition: A | Discharge status: Home / Self Care | Payer: Self-pay | Source: Ambulatory Visit | Attending: Obstetrics & Gynecology | Admitting: Obstetrics & Gynecology

## 2015-03-21 ENCOUNTER — Encounter (HOSPITAL_COMMUNITY): Payer: Self-pay | Admitting: *Deleted

## 2015-03-21 DIAGNOSIS — O209 Hemorrhage in early pregnancy, unspecified: Secondary | ICD-10-CM | POA: Insufficient documentation

## 2015-03-21 DIAGNOSIS — B3731 Acute candidiasis of vulva and vagina: Secondary | ICD-10-CM

## 2015-03-21 DIAGNOSIS — O468X1 Other antepartum hemorrhage, first trimester: Secondary | ICD-10-CM

## 2015-03-21 DIAGNOSIS — O418X10 Other specified disorders of amniotic fluid and membranes, first trimester, not applicable or unspecified: Secondary | ICD-10-CM

## 2015-03-21 DIAGNOSIS — O200 Threatened abortion: Secondary | ICD-10-CM | POA: Insufficient documentation

## 2015-03-21 DIAGNOSIS — Z3A01 Less than 8 weeks gestation of pregnancy: Secondary | ICD-10-CM | POA: Insufficient documentation

## 2015-03-21 DIAGNOSIS — B373 Candidiasis of vulva and vagina: Secondary | ICD-10-CM | POA: Insufficient documentation

## 2015-03-21 DIAGNOSIS — M25551 Pain in right hip: Secondary | ICD-10-CM | POA: Insufficient documentation

## 2015-03-21 LAB — URINALYSIS, ROUTINE W REFLEX MICROSCOPIC
Bilirubin Urine: NEGATIVE
GLUCOSE, UA: NEGATIVE mg/dL
KETONES UR: NEGATIVE mg/dL
Leukocytes, UA: NEGATIVE
Nitrite: NEGATIVE
Protein, ur: NEGATIVE mg/dL
Specific Gravity, Urine: 1.01 (ref 1.005–1.030)
Urobilinogen, UA: 0.2 mg/dL (ref 0.0–1.0)
pH: 6 (ref 5.0–8.0)

## 2015-03-21 LAB — URINE MICROSCOPIC-ADD ON

## 2015-03-21 LAB — CBC
HCT: 36.7 % (ref 36.0–46.0)
Hemoglobin: 12.8 g/dL (ref 12.0–15.0)
MCH: 33.5 pg (ref 26.0–34.0)
MCHC: 34.9 g/dL (ref 30.0–36.0)
MCV: 96.1 fL (ref 78.0–100.0)
PLATELETS: 207 10*3/uL (ref 150–400)
RBC: 3.82 MIL/uL — ABNORMAL LOW (ref 3.87–5.11)
RDW: 12.5 % (ref 11.5–15.5)
WBC: 6.2 10*3/uL (ref 4.0–10.5)

## 2015-03-21 LAB — WET PREP, GENITAL: Trich, Wet Prep: NONE SEEN

## 2015-03-21 LAB — HCG, QUANTITATIVE, PREGNANCY: hCG, Beta Chain, Quant, S: 20971 m[IU]/mL — ABNORMAL HIGH (ref <5)

## 2015-03-21 LAB — POCT PREGNANCY, URINE: Preg Test, Ur: POSITIVE — AB

## 2015-03-21 MED ORDER — TERCONAZOLE 0.4 % VA CREA
1.0000 | TOPICAL_CREAM | Freq: Every day | VAGINAL | Status: DC
Start: 2015-03-21 — End: 2015-03-31

## 2015-03-21 NOTE — MAU Provider Note (Signed)
History     CSN: [id number]  Arrival date and time: 03/21/15 1517   First Provider Initiated Contact with Patient 03/21/15 1622      Chief Complaint  Patient presents with  . Vaginal Bleeding   HPI   Ms.Alyssa Wallace is a 29 y.o. female G3P0202 at [redacted]w[redacted]d presenting to MAU with vaginal bleeding. The bleeding started one hour ago; she notices it only when she wipes. The blood is described as "pink in color."  She denies pain currently; occasional pain in her lower abdomen-hips. None now 0000000  LMP is certain.    OB History    Gravida Para Term Preterm AB TAB SAB Ectopic Multiple Living   3 2 0 2 0 0 0 0 0 2       Past Medical History  Diagnosis Date  . Preterm labor     Past Surgical History  Procedure Laterality Date  . Cesarean section  04/05/2012    Procedure: CESAREAN SECTION;  Surgeon: Woodroe Mode, MD;  Location: [facility];  Service: Obstetrics;  Laterality: N/A;  Primary Cesarean Section Delivery Girl @ [phone], Apgars 5/8    Family History  Problem Relation Age of Onset  . Diabetes Mother   . Diabetes Father   . Diabetes Sister     Social History  Substance Use Topics  . Smoking status: Never Smoker   . Smokeless tobacco: Never Used  . Alcohol Use: No    Allergies: No Known Allergies  Prescriptions prior to admission  Medication Sig Dispense Refill Last Dose  . ibuprofen (ADVIL,MOTRIN) 200 MG tablet Take 200 mg by mouth every 8 (eight) hours as needed for moderate pain.   Not Taking  . Prenatal Vit-Fe Fumarate-FA (PRENATAL MULTIVITAMIN) TABS Take 1 tablet by mouth daily.   Not Taking   Results for orders placed or performed during the hospital encounter of 03/21/15 (from the past 24 hour(s))  Urinalysis, Routine w reflex microscopic (not at [facility])     Status: Abnormal   Collection Time: 03/21/15  3:55 PM  Result Value Ref Range   Color, Urine YELLOW YELLOW   APPearance CLEAR CLEAR   Specific Gravity, Urine 1.010 1.005 - 1.030   pH 6.0 5.0 -  8.0   Glucose, UA NEGATIVE NEGATIVE mg/dL   Hgb urine dipstick TRACE (A) NEGATIVE   Bilirubin Urine NEGATIVE NEGATIVE   Ketones, ur NEGATIVE NEGATIVE mg/dL   Protein, ur NEGATIVE NEGATIVE mg/dL   Urobilinogen, UA 0.2 0.0 - 1.0 mg/dL   Nitrite NEGATIVE NEGATIVE   Leukocytes, UA NEGATIVE NEGATIVE  Urine microscopic-add on     Status: None   Collection Time: 03/21/15  3:55 PM  Result Value Ref Range   Squamous Epithelial / LPF RARE RARE   WBC, UA 0-2 <3 WBC/hpf   RBC / HPF 0-2 <3 RBC/hpf  Pregnancy, urine POC     Status: Abnormal   Collection Time: 03/21/15  4:06 PM  Result Value Ref Range   Preg Test, Ur POSITIVE (A) NEGATIVE  Wet prep, genital     Status: Abnormal   Collection Time: 03/21/15  4:30 PM  Result Value Ref Range   Yeast Wet Prep HPF POC MODERATE (A) NONE SEEN   Trich, Wet Prep NONE SEEN NONE SEEN   Clue Cells Wet Prep HPF POC FEW (A) NONE SEEN   WBC, Wet Prep HPF POC MODERATE (A) NONE SEEN  hCG, quantitative, pregnancy     Status: Abnormal   Collection Time: 03/21/15  4:35 PM  Result Value Ref Range   hCG, Beta Chain, [place], S 20971 (H) <5 mIU/mL  CBC     Status: Abnormal   Collection Time: 03/21/15  4:35 PM  Result Value Ref Range   WBC 6.2 4.0 - 10.5 K/uL   RBC 3.82 (L) 3.87 - 5.11 MIL/uL   Hemoglobin 12.8 12.0 - 15.0 g/dL   HCT 36.7 36.0 - 46.0 %   MCV 96.1 78.0 - 100.0 fL   MCH 33.5 26.0 - 34.0 pg   MCHC 34.9 30.0 - 36.0 g/dL   RDW 12.5 11.5 - 15.5 %   Platelets 207 150 - 400 K/uL   US Ob Comp Less 14 Wks  03/21/2015  CLINICAL DATA:  Pregnant with vaginal bleeding for 2 hours. EXAM: OBSTETRIC <14 WK [place] AND TRANSVAGINAL OB US TECHNIQUE: Both transabdominal and transvaginal ultrasound examinations were performed for complete evaluation of the gestation as well as the maternal uterus, adnexal regions, and pelvic cul-de-sac. Transvaginal technique was performed to assess early pregnancy. COMPARISON:  None. FINDINGS: Intrauterine gestational sac:  Visualized/normal in shape. Yolk sac:  Present Embryo:  Present Cardiac Activity: None Heart Rate: N/A  bpm CRL:  4.1  mm   6 w   1 d                  [place] EDC: 11/13/2015 Maternal uterus/adnexae: Small subchorionic hemorrhage Normal right ovary. Normal left ovary. No free pelvic fluid. IMPRESSION: 1. Intrauterine gestational sac with small embryo estimated at 6 weeks and 1 day gestation. No cardiac activity is demonstrated. Recommend short-term follow-up pelvic ultrasound, 7-10 days. 2. Small subchorionic hemorrhage. 3. Normal ovaries. Electronically Signed   By: Marijo Sanes M.D.   On: 03/21/2015 17:39   US Ob Transvaginal  03/21/2015  CLINICAL DATA:  Pregnant with vaginal bleeding for 2 hours. EXAM: OBSTETRIC <14 WK [place] AND TRANSVAGINAL OB US TECHNIQUE: Both transabdominal and transvaginal ultrasound examinations were performed for complete evaluation of the gestation as well as the maternal uterus, adnexal regions, and pelvic cul-de-sac. Transvaginal technique was performed to assess early pregnancy. COMPARISON:  None. FINDINGS: Intrauterine gestational sac: Visualized/normal in shape. Yolk sac:  Present Embryo:  Present Cardiac Activity: None Heart Rate: N/A  bpm CRL:  4.1  mm   6 w   1 d                  [place] EDC: 11/13/2015 Maternal uterus/adnexae: Small subchorionic hemorrhage Normal right ovary. Normal left ovary. No free pelvic fluid. IMPRESSION: 1. Intrauterine gestational sac with small embryo estimated at 6 weeks and 1 day gestation. No cardiac activity is demonstrated. Recommend short-term follow-up pelvic ultrasound, 7-10 days. 2. Small subchorionic hemorrhage. 3. Normal ovaries. Electronically Signed   By: Marijo Sanes M.D.   On: 03/21/2015 17:39     Review of Systems  Constitutional: Negative for fever.  Gastrointestinal: Negative for nausea, vomiting and abdominal pain.  Genitourinary: Negative for dysuria.   Physical Exam   Blood pressure 104/66, pulse 70, temperature 98.2 F (36.8  C), temperature source Oral, resp. rate 16, last menstrual period 01/17/2015.  Physical Exam  Constitutional: She is oriented to person, place, and time. She appears well-developed and well-nourished. No distress.  HENT:  Head: Normocephalic.  Eyes: Pupils are equal, round, and reactive to light.  Neck: Neck supple.  GI: Soft. She exhibits no distension and no mass. There is no tenderness. There is no rebound and no guarding.  Genitourinary:  Speculum exam: Vagina - Small  amount of mucus like, bloody discharge, no odor Cervix - + contact bleeding  Bimanual exam: Cervix closed, no CMT  Uterus non tender, gravid  Adnexa non tender, no masses bilaterally GC/Chlam, wet prep done Chaperone present for exam.  Musculoskeletal: Normal range of motion.  Neurological: She is alert and oriented to person, place, and time.  Skin: Skin is warm. She is not diaphoretic.  Psychiatric: Her behavior is normal.    MAU Course  Procedures  None  MDM  O positive blood type   Assessment and Plan   A:  1. Vaginal yeast infection   2. Vaginal bleeding in pregnancy, first trimester   3. Subchorionic hematoma in first trimester   4. Threatened miscarriage     P:  Discharge home in stable condition Patient is certain of LMP; [place] dating and LMP inconsistent, concerning for failed pregnancy. Repeat US in 10 days.  Bleeding precautions  Return to MAU if symptoms worsen  First trimester warning signs discussed  Pelvic rest  RX: terazol 7 day     Lezlie Lye, NP 03/21/2015 4:33 PM

## 2015-03-21 NOTE — MAU Note (Signed)
Pt is seeing blood when she went to the BR today - with wiping.  Also C/O bilateral hip pain ever since she found out she was pregnant.  [facility].

## 2015-03-21 NOTE — Discharge Instructions (Signed)
Amenaza de aborto (Threatened Miscarriage) La amenaza de aborto se produce cuando hay hemorragia vaginal durante las primeras 20semanas de [place], pero el embarazo no se interrumpe. Si durante este perodo usted tiene hemorragia vaginal, el mdico le har pruebas para asegurarse de que el embarazo contine. Si las pruebas muestran que usted contina embarazada y que el "beb" en desarrollo (feto) dentro del tero sigue creciendo, se considera que tuvo una [place] de aborto. La amenaza de aborto no implica que el embarazo vaya a Manufacturing engineer, pero s aumenta el riesgo de perder el embarazo (aborto completo). CAUSAS  Por lo general, no se conoce la causa de la amenaza de aborto. Si el resultado final es el aborto completo, la causa ms frecuente es la cantidad anormal de cromosomas del feto. Los cromosomas son las estructuras internas de las clulas que contienen todo Agricultural engineer gentico. Clinical research associate de las causas de hemorragia vaginal que no ocasionan un aborto incluyen:  [place].  [place].  Los cambios hormonales normales durante el [place].  La hemorragia que se produce cuando el vulo se implanta en el tero. FACTORES DE RIESGO Los factores de riesgo de hemorragia al principio del embarazo incluyen:  Obesidad.  Fumar.  El consumo de cantidades excesivas de alcohol o cafena.  El consumo de drogas. SIGNOS Y SNTOMAS  Hemorragia vaginal leve.  Dolor o clicos abdominales leves. DIAGNSTICO  Si tiene hemorragia con o sin dolor abdominal antes de las 20semanas de [place], el mdico le har pruebas para determinar si el embarazo contina. Una prueba importante incluye el uso de ondas sonoras y de una computadora (ecografa) para crear imgenes del interior del tero. Otras pruebas incluyen el examen interno de la vagina y el tero (examen plvico), y el control de la frecuencia cardaca del feto.  Es posible que le diagnostiquen una amenaza de aborto en los  siguientes casos:  La ecografa muestra que el embarazo contina.  La frecuencia cardaca del feto es alta.  El examen plvico muestra que la apertura entre el tero y la vagina (cuello del tero) est cerrada.  Su frecuencia cardaca y su presin arterial estn estables.  Los [employer] de sangre confirman que el embarazo contina. TRATAMIENTO  No se ha demostrado que ningn tratamiento evite que una amenaza de aborto se [place] en un aborto completo. Sin embargo, los cuidados [employer] hogar son importantes.  INSTRUCCIONES PARA EL CUIDADO EN EL HOGAR   Asegrese de asistir a todas las citas de cuidados prenatales. Esto es [employer].  Descanse lo suficiente.  No tenga relaciones sexuales ni use tampones si tiene hemorragia vaginal.  No se haga duchas vaginales.  No fume ni consuma drogas.  No beba alcohol.  Evite la cafena. SOLICITE ATENCIN MDICA SI:  Tiene una ligera hemorragia o manchado vaginal durante el embarazo.  Tiene dolor o clicos en el abdomen.  Tiene fiebre. SOLICITE ATENCIN MDICA DE INMEDIATO SI:  Tiene una hemorragia vaginal abundante.  Elimina cogulos de sangre por la vagina.  Siente dolor en la parte baja de la espalda o clicos abdominales intensos.  Tiene fiebre, escalofros y dolor abdominal intenso. ASEGRESE DE QUE:  Comprende estas instrucciones.  Controlar su afeccin.  Recibir ayuda de inmediato si no mejora o si empeora.   Esta informacin no tiene Marine scientist el consejo del mdico. Asegrese de hacerle al mdico cualquier pregunta que tenga.   Document Released: 02/03/2005 Document Revised: 05/01/2013 Elsevier Interactive Patient Education 2016 Regino Ramirez.  Hematoma subcorinico (Subchorionic Hematoma)  Un hematoma subcorinico es una acumulacin de sangre entre la pared externa de la placenta y la pared interna del la matriz (tero). La placenta es el rgano que conecta el feto a la pared del tero. La  placenta realiza la funcin de alimentacin, respiracin (oxgeno al feto) y el trabajo de eliminacin de desechos (excrecin) del feto.  Un hematoma subcorinico es la anormalidad ms frecuente encontrada en una ecografa durante el primer trimestre o principios del segundo trimestre del embarazo. Si ha habido poca o ninguna hemorragia vaginal, generalmente los pequeos hematomas se reducen por su propia cuenta y no afectan al beb ni al [place]. La sangre es absorbida gradualmente durante una o [place]. Cuando la hemorragia comienza ms tarde en el embarazo o el hematoma es ms grande o se produce en una paciente de edad avanzada, el resultado puede no ser tan bueno. Los grandes hematomas pueden agrandarse an ms y [place] las posibilidades de aborto espontneo. El hematoma subcorinico tambin aumenta el riesgo de desprendimiento precoz de la placenta del tero, muerte fetal y Environmental education officer. INSTRUCCIONES PARA EL CUIDADO EN EL HOGAR   Repose en cama si el mdico se lo recomienda. Aunque el reposo en cama no evitar la hemorragia o un aborto espontneo, su mdico puede recomendarlo.  Evite levantar objetos pesados (ms de 10 libras [4,5 kg]), hacer ejercicio, tener relaciones sexuales o realizar duchas vaginales segn se lo indique el profesional.  Lleve un registro de la cantidad y Energy manager de remojo (saturacin) de las toallas higinicas que Medical laboratory scientific officer. [place] esta informacin.  No use tampones.  Cumpla con todas las visitas de control, segn le indique su mdico. El profesional podr pedirle que se realice anlisis de seguimiento, pruebas de [place] o [place]. SOLICITE ATENCIN MDICA DE INMEDIATO SI:   Siente calambres intensos en el estmago, en la espalda, en el abdomen o en la pelvis.  Tiene fiebre.  Elimina cogulos o tejidos grandes. Guarde los tejidos para que su mdico los vea.  Si la hemorragia aumenta o siente mareos, debilidad o tiene episodios de [place].   Esta  informacin no tiene Marine scientist el consejo del mdico. Asegrese de hacerle al mdico cualquier pregunta que tenga.   Document Released: 08/12/2008 Document Revised: 02/14/2013 Elsevier Interactive Patient Education 2016 [place]  (Pelvic Rest) El reposo plvico se recomienda a las mujeres cuando:   La placenta cubre parcial o completamente la abertura del cuello del tero (placenta previa).  Hay sangrado entre la pared del tero y el saco amnitico en el primer trimestre (hemorragia subcorinica).  El cuello uterino comienza a abrirse sin iniciarse el trabajo de parto (cuello uterino incompetente, insuficiencia cervical).  El [place] de parto se inicia muy pronto (parto prematuro). INSTRUCCIONES PARA EL CUIDADO EN EL HOGAR   No tenga relaciones sexuales, estimulacin, ni orgasmos.  No use tampones, no se haga duchas vaginales ni coloque ningn objeto en la vagina.  No levante objetos que pesen ms de 10 libras (4,5 kg).  Evite las actividades extenuantes o tensionar los msculos de la pelvis. SOLICITE ATENCIN MDICA SI:   Tiene un sangrado vaginal durante el embarazo. Considrelo como una posible emergencia.  Siente clicos en la zona baja del estmago (ms fuertes que los clicos menstruales).  Nota flujo vaginal (acuoso, con moco o [place]).  Siente un dolor en la espalda leve y sordo.  Tiene contracciones regulares o endurecimiento del tero. SOLICITE ATENCIN MDICA DE INMEDIATO SI:  Observa sangrado vaginal y tiene  placenta previa.    Esta informacin no tiene Marine scientist el consejo del mdico. Asegrese de hacerle al mdico cualquier pregunta que tenga.   Document Released: 01/19/2012 Elsevier Interactive Patient Education [employer].

## 2015-03-22 ENCOUNTER — Inpatient Hospital Stay (HOSPITAL_COMMUNITY)
Admission: AD | Admit: 2015-03-22 | Discharge: 2015-03-22 | Disposition: A | Discharge status: Home / Self Care | Payer: Self-pay | Source: Ambulatory Visit | Attending: Family Medicine | Admitting: Family Medicine

## 2015-03-22 ENCOUNTER — Encounter (HOSPITAL_COMMUNITY): Payer: Self-pay | Admitting: *Deleted

## 2015-03-22 DIAGNOSIS — O200 Threatened abortion: Secondary | ICD-10-CM | POA: Insufficient documentation

## 2015-03-22 DIAGNOSIS — Z3A01 Less than 8 weeks gestation of pregnancy: Secondary | ICD-10-CM | POA: Insufficient documentation

## 2015-03-22 LAB — URINALYSIS, ROUTINE W REFLEX MICROSCOPIC
Bilirubin Urine: NEGATIVE
Glucose, UA: NEGATIVE mg/dL
Ketones, ur: NEGATIVE mg/dL
Leukocytes, UA: NEGATIVE
Nitrite: NEGATIVE
PROTEIN: NEGATIVE mg/dL
Specific Gravity, Urine: 1.01 (ref 1.005–1.030)
UROBILINOGEN UA: 0.2 mg/dL (ref 0.0–1.0)
pH: 8 (ref 5.0–8.0)

## 2015-03-22 LAB — URINE MICROSCOPIC-ADD ON

## 2015-03-22 LAB — TYPE AND SCREEN
ABO/RH(D): O POS
Antibody Screen: NEGATIVE

## 2015-03-22 LAB — POCT PREGNANCY, URINE: PREG TEST UR: POSITIVE — AB

## 2015-03-22 LAB — HIV ANTIBODY (ROUTINE TESTING W REFLEX): HIV Screen 4th Generation wRfx: NONREACTIVE

## 2015-03-22 NOTE — Progress Notes (Signed)
Assisted RN with interpretation of initial assessment Spanish interpreter

## 2015-03-22 NOTE — MAU Note (Signed)
PT  SAYS S WITH  INTERPRETER BENITA   -  THAT  SHE WAS HERE YESTERDAY  - FOR   BLEEDING  BUT   TODAY AT [place].   .  IN ROOM-   SMEARS  ON PERINEUM.      DENIES CRAMPS  YESTERDAY -  BUT TODAY  CRAMPS  STARTED   AT 730PM  -  PAIN  3.   ALSO  SAYS  HER HEAD  HURTS - STARTED  AT 730PM-    NO MEDS.       PLANS  TO GET [facility]  WITH   HD-  HAS AN APPOINTMENT ON 12-7.

## 2015-03-22 NOTE — MAU Provider Note (Signed)
MAU HISTORY AND PHYSICAL  Chief Complaint:  Vaginal bleeding  Analycia Wallace is a 29 y.o.  [id number] with IUP at [redacted]w[redacted]d presenting for vaginal bleeding.  Patient seen in this mau yesterday for vaginal bleeding. TVUS showed gestational sac, yolk sac, CRL, no cardiac activity.   This evening had more bleeding, equivalent to a period. More than yesterday. No cramping, no fever or chills or nausea.   Past Medical History  Diagnosis Date  . Preterm labor     Past Surgical History  Procedure Laterality Date  . Cesarean section  04/05/2012    Procedure: CESAREAN SECTION;  Surgeon: Woodroe Mode, MD;  Location: [facility];  Service: Obstetrics;  Laterality: N/A;  Primary Cesarean Section Delivery Girl @ [phone], Apgars 5/8    Family History  Problem Relation Age of Onset  . Diabetes Mother   . Diabetes Father   . Diabetes Sister     Social History  Substance Use Topics  . Smoking status: Never Smoker   . Smokeless tobacco: Never Used  . Alcohol Use: No    No Known Allergies  Prescriptions prior to admission  Medication Sig Dispense Refill Last Dose  . Prenatal Vit-Fe Fumarate-FA (PRENATAL MULTIVITAMIN) TABS Take 1 tablet by mouth daily.   03/22/2015 at Unknown time  . terconazole (TERAZOL 7) 0.4 % vaginal cream Place 1 applicator vaginally at bedtime. 45 g 0 03/22/2015 at Unknown time    Review of Systems - Negative except for what is mentioned in HPI.  Physical Exam  Blood pressure 102/67, pulse 67, temperature 99.4 F (37.4 C), temperature source Oral, resp. rate 20, height 4\' 11"  (1.499 m), weight 118 lb (53.524 kg), last menstrual period 01/17/2015. GENERAL: Well-developed, well-nourished female in no acute distress.  LUNGS: Clear to auscultation bilaterally.  HEART: Regular rate and rhythm. ABDOMEN: Soft, nontender, nondistended, gravid.  EXTREMITIES: Nontender, no edema, 2+ distal pulses. GU: moderate amount blood in vagina, cervix closed visually   Labs: Results  for orders placed or performed during the hospital encounter of 03/22/15 (from the past 24 hour(s))  Urinalysis, Routine w reflex microscopic (not at [facility])   Collection Time: 03/22/15  8:30 PM  Result Value Ref Range   Color, Urine YELLOW YELLOW   APPearance CLOUDY (A) CLEAR   Specific Gravity, Urine 1.010 1.005 - 1.030   pH 8.0 5.0 - 8.0   Glucose, UA NEGATIVE NEGATIVE mg/dL   Hgb urine dipstick LARGE (A) NEGATIVE   Bilirubin Urine NEGATIVE NEGATIVE   Ketones, ur NEGATIVE NEGATIVE mg/dL   Protein, ur NEGATIVE NEGATIVE mg/dL   Urobilinogen, UA 0.2 0.0 - 1.0 mg/dL   Nitrite NEGATIVE NEGATIVE   Leukocytes, UA NEGATIVE NEGATIVE  Urine microscopic-add on   Collection Time: 03/22/15  8:30 PM  Result Value Ref Range   Squamous Epithelial / LPF FEW (A) RARE   WBC, UA 0-2 <3 WBC/hpf   RBC / HPF TOO NUMEROUS TO COUNT <3 RBC/hpf   Bacteria, UA MANY (A) RARE  Pregnancy, urine POC   Collection Time: 03/22/15  8:38 PM  Result Value Ref Range   Preg Test, Ur POSITIVE (A) NEGATIVE    Imaging Studies:  US Ob Comp Less 14 Wks  03/21/2015  CLINICAL DATA:  Pregnant with vaginal bleeding for 2 hours. EXAM: OBSTETRIC <14 WK [place] AND TRANSVAGINAL OB US TECHNIQUE: Both transabdominal and transvaginal ultrasound examinations were performed for complete evaluation of the gestation as well as the maternal uterus, adnexal regions, and pelvic cul-de-sac. Transvaginal technique was performed  to assess early pregnancy. COMPARISON:  None. FINDINGS: Intrauterine gestational sac: Visualized/normal in shape. Yolk sac:  Present Embryo:  Present Cardiac Activity: None Heart Rate: N/A  bpm CRL:  4.1  mm   6 w   1 d                  [place] EDC: 11/13/2015 Maternal uterus/adnexae: Small subchorionic hemorrhage Normal right ovary. Normal left ovary. No free pelvic fluid. IMPRESSION: 1. Intrauterine gestational sac with small embryo estimated at 6 weeks and 1 day gestation. No cardiac activity is demonstrated. Recommend short-term  follow-up pelvic ultrasound, 7-10 days. 2. Small subchorionic hemorrhage. 3. Normal ovaries. Electronically Signed   By: Marijo Sanes M.D.   On: 03/21/2015 17:39   US Ob Transvaginal  03/21/2015  CLINICAL DATA:  Pregnant with vaginal bleeding for 2 hours. EXAM: OBSTETRIC <14 WK [place] AND TRANSVAGINAL OB US TECHNIQUE: Both transabdominal and transvaginal ultrasound examinations were performed for complete evaluation of the gestation as well as the maternal uterus, adnexal regions, and pelvic cul-de-sac. Transvaginal technique was performed to assess early pregnancy. COMPARISON:  None. FINDINGS: Intrauterine gestational sac: Visualized/normal in shape. Yolk sac:  Present Embryo:  Present Cardiac Activity: None Heart Rate: N/A  bpm CRL:  4.1  mm   6 w   1 d                  [place] EDC: 11/13/2015 Maternal uterus/adnexae: Small subchorionic hemorrhage Normal right ovary. Normal left ovary. No free pelvic fluid. IMPRESSION: 1. Intrauterine gestational sac with small embryo estimated at 6 weeks and 1 day gestation. No cardiac activity is demonstrated. Recommend short-term follow-up pelvic ultrasound, 7-10 days. 2. Small subchorionic hemorrhage. 3. Normal ovaries. Electronically Signed   By: Marijo Sanes M.D.   On: 03/21/2015 17:39    Assessment: Alyssa Wallace is  29 y.o. [id number] at [redacted]w[redacted]d presents with threatened abortion.  Plan: - labs obtained yesterday; adding type and screen today - u/s yesterday confirmed IUP - explained to patient that this could represent progression of a failed pregnancy - home with bleeding and infection return precautions - u/s in 7-10 days; order has already been placed  Patsy Lager [facility] 11/12/20169:53 PM

## 2015-03-22 NOTE — Progress Notes (Signed)
Assisted RN with interpretation of discharge instructions.  °Spanish Interpreter  °

## 2015-03-22 NOTE — Progress Notes (Signed)
Assisted registration with admission questions.  Spanish Interpreter

## 2015-03-22 NOTE — Discharge Instructions (Signed)
Amenaza de aborto (Threatened Miscarriage) La amenaza de aborto se produce cuando hay hemorragia vaginal durante las primeras 20semanas de [place], pero el embarazo no se interrumpe. Si durante este perodo usted tiene hemorragia vaginal, el mdico le har pruebas para asegurarse de que el embarazo contine. Si las pruebas muestran que usted contina embarazada y que el "beb" en desarrollo (feto) dentro del tero sigue creciendo, se considera que tuvo una [place] de aborto. La amenaza de aborto no implica que el embarazo vaya a Manufacturing engineer, pero s aumenta el riesgo de perder el embarazo (aborto completo). CAUSAS  Por lo general, no se conoce la causa de la amenaza de aborto. Si el resultado final es el aborto completo, la causa ms frecuente es la cantidad anormal de cromosomas del feto. Los cromosomas son las estructuras internas de las clulas que contienen todo Agricultural engineer gentico. Clinical research associate de las causas de hemorragia vaginal que no ocasionan un aborto incluyen:  [place].  [place].  Los cambios hormonales normales durante el [place].  La hemorragia que se produce cuando el vulo se implanta en el tero. FACTORES DE RIESGO Los factores de riesgo de hemorragia al principio del embarazo incluyen:  Obesidad.  Fumar.  El consumo de cantidades excesivas de alcohol o cafena.  El consumo de drogas. SIGNOS Y SNTOMAS  Hemorragia vaginal leve.  Dolor o clicos abdominales leves. DIAGNSTICO  Si tiene hemorragia con o sin dolor abdominal antes de las 20semanas de [place], el mdico le har pruebas para determinar si el embarazo contina. Una prueba importante incluye el uso de ondas sonoras y de una computadora (ecografa) para crear imgenes del interior del tero. Otras pruebas incluyen el examen interno de la vagina y el tero (examen plvico), y el control de la frecuencia cardaca del feto.  Es posible que le diagnostiquen una amenaza de aborto en los  siguientes casos:  La ecografa muestra que el embarazo contina.  La frecuencia cardaca del feto es alta.  El examen plvico muestra que la apertura entre el tero y la vagina (cuello del tero) est cerrada.  Su frecuencia cardaca y su presin arterial estn estables.  Los [employer] de sangre confirman que el embarazo contina. TRATAMIENTO  No se ha demostrado que ningn tratamiento evite que una amenaza de aborto se [place] en un aborto completo. Sin embargo, los cuidados [employer] hogar son importantes.  INSTRUCCIONES PARA EL CUIDADO EN EL HOGAR   Asegrese de asistir a todas las citas de cuidados prenatales. Esto es [employer].  Descanse lo suficiente.  No tenga relaciones sexuales ni use tampones si tiene hemorragia vaginal.  No se haga duchas vaginales.  No fume ni consuma drogas.  No beba alcohol.  Evite la cafena. SOLICITE ATENCIN MDICA SI:  Tiene una ligera hemorragia o manchado vaginal durante el embarazo.  Tiene dolor o clicos en el abdomen.  Tiene fiebre. SOLICITE ATENCIN MDICA DE INMEDIATO SI:  Tiene una hemorragia vaginal abundante.  Elimina cogulos de sangre por la vagina.  Siente dolor en la parte baja de la espalda o clicos abdominales intensos.  Tiene fiebre, escalofros y dolor abdominal intenso. ASEGRESE DE QUE:  Comprende estas instrucciones.  Controlar su afeccin.  Recibir ayuda de inmediato si no mejora o si empeora.   Esta informacin no tiene Marine scientist el consejo del mdico. Asegrese de hacerle al mdico cualquier pregunta que tenga.   Document Released: 02/03/2005 Document Revised: 05/01/2013 Elsevier Interactive Patient Education [employer].

## 2015-03-24 LAB — GC/CHLAMYDIA PROBE AMP (~~LOC~~) NOT AT ARMC
CHLAMYDIA, DNA PROBE: NEGATIVE
NEISSERIA GONORRHEA: NEGATIVE

## 2015-03-31 ENCOUNTER — Encounter (HOSPITAL_COMMUNITY): Payer: Self-pay | Admitting: *Deleted

## 2015-03-31 ENCOUNTER — Ambulatory Visit (HOSPITAL_COMMUNITY)
Admission: RE | Admit: 2015-03-31 | Discharge: 2015-03-31 | Disposition: A | Discharge status: Home / Self Care | Payer: Self-pay | Source: Ambulatory Visit | Attending: Obstetrics and Gynecology | Admitting: Obstetrics and Gynecology

## 2015-03-31 ENCOUNTER — Inpatient Hospital Stay (HOSPITAL_COMMUNITY)
Admission: AD | Admit: 2015-03-31 | Discharge: 2015-03-31 | Disposition: A | Discharge status: Home / Self Care | Payer: Self-pay | Source: Ambulatory Visit | Attending: Family Medicine | Admitting: Family Medicine

## 2015-03-31 DIAGNOSIS — O034 Incomplete spontaneous abortion without complication: Secondary | ICD-10-CM

## 2015-03-31 DIAGNOSIS — Z3A00 Weeks of gestation of pregnancy not specified: Secondary | ICD-10-CM | POA: Insufficient documentation

## 2015-03-31 DIAGNOSIS — O039 Complete or unspecified spontaneous abortion without complication: Secondary | ICD-10-CM

## 2015-03-31 DIAGNOSIS — O468X1 Other antepartum hemorrhage, first trimester: Secondary | ICD-10-CM

## 2015-03-31 DIAGNOSIS — O284 Abnormal radiological finding on antenatal screening of mother: Secondary | ICD-10-CM | POA: Insufficient documentation

## 2015-03-31 DIAGNOSIS — O418X10 Other specified disorders of amniotic fluid and membranes, first trimester, not applicable or unspecified: Secondary | ICD-10-CM

## 2015-03-31 DIAGNOSIS — O209 Hemorrhage in early pregnancy, unspecified: Secondary | ICD-10-CM

## 2015-03-31 LAB — CBC
HEMATOCRIT: 39.6 % (ref 36.0–46.0)
HEMOGLOBIN: 13.7 g/dL (ref 12.0–15.0)
MCH: 33.3 pg (ref 26.0–34.0)
MCHC: 34.6 g/dL (ref 30.0–36.0)
MCV: 96.1 fL (ref 78.0–100.0)
Platelets: 250 10*3/uL (ref 150–400)
RBC: 4.12 MIL/uL (ref 3.87–5.11)
RDW: 12.4 % (ref 11.5–15.5)
WBC: 5.9 10*3/uL (ref 4.0–10.5)

## 2015-03-31 LAB — HCG, QUANTITATIVE, PREGNANCY: hCG, Beta Chain, Quant, S: 230 m[IU]/mL — ABNORMAL HIGH (ref <5)

## 2015-03-31 NOTE — MAU Provider Note (Signed)
History     CSN: [id number]  Arrival date and time: 03/31/15 1529   First Provider Initiated Contact with Patient 03/31/15 1825      No chief complaint on file.  HPI   Ms.Alyssa Wallace is a 29 y.o. female [id number] at [redacted]w[redacted]d presenting to MAU for follow up US. She was seen on 11/11 with pain and bleeding and US showed an IUP with subchorionic hemorrhage. She came back on 11/12 with worsening bleeding and was told this was likely a failed pregnancy. [place] was scheduled for 10 days out.   She has had very small amount of bleeding over the last few days, she notices it only when she wipes No pain.   OB History    Gravida Para Term Preterm AB TAB SAB Ectopic Multiple Living   3 2 0 2 0 0 0 0 0 2       Past Medical History  Diagnosis Date  . Preterm labor     Past Surgical History  Procedure Laterality Date  . Cesarean section  04/05/2012    Procedure: CESAREAN SECTION;  Surgeon: Woodroe Mode, MD;  Location[facility];  Service: Obstetrics;  Laterality: N/A;  Primary Cesarean Section Delivery Girl @ [phone], Apgars 5/8    Family History  Problem Relation Age of Onset  . Diabetes Mother   . Diabetes Father   . Diabetes Sister     Social History  Substance Use Topics  . Smoking status: Never Smoker   . Smokeless tobacco: Never Used  . Alcohol Use: No    Allergies: No Known Allergies  Prescriptions prior to admission  Medication Sig Dispense Refill Last Dose  . ibuprofen (ADVIL,MOTRIN) 200 MG tablet Take 200 mg by mouth every 6 (six) hours as needed for headache.   Past Week at Unknown time  . Prenatal Vit-Fe Fumarate-FA (PRENATAL MULTIVITAMIN) TABS Take 1 tablet by mouth daily.   Past Week at Unknown time  . terconazole (TERAZOL 7) 0.4 % vaginal cream Place 1 applicator vaginally at bedtime. (Patient not taking: Reported on 03/31/2015) 45 g 0 03/22/2015 at Unknown time   Results for orders placed or performed during the hospital encounter of 03/31/15 (from the past 48  hour(s))  CBC     Status: None   Collection Time: 03/31/15  5:40 PM  Result Value Ref Range   WBC 5.9 4.0 - 10.5 K/uL   RBC 4.12 3.87 - 5.11 MIL/uL   Hemoglobin 13.7 12.0 - 15.0 g/dL   HCT 39.6 36.0 - 46.0 %   MCV 96.1 78.0 - 100.0 fL   MCH 33.3 26.0 - 34.0 pg   MCHC 34.6 30.0 - 36.0 g/dL   RDW 12.4 11.5 - 15.5 %   Platelets 250 150 - 400 K/uL  hCG, quantitative, pregnancy     Status: Abnormal   Collection Time: 03/31/15  5:40 PM  Result Value Ref Range   hCG, Beta Chain, Quant, S 230 (H) <5 mIU/mL    Comment:          GEST. AGE      CONC.  (mIU/mL)   <=1 WEEK        5 - 50     2 WEEKS       50 - 500     3 WEEKS       100 - 10,000     4 WEEKS     1,000 - 30,000     5 WEEKS     3,500 -  115,000   6-8 WEEKS     12,000 - 270,000    12 WEEKS     15,000 - 220,000        FEMALE AND NON-PREGNANT FEMALE:     LESS THAN 5 mIU/mL     US Ob Transvaginal  03/31/2015  CLINICAL DATA:  Recent ultrasound with inconclusive viability. Followup bleeding. EXAM: OBSTETRIC <14 WK [place] AND TRANSVAGINAL OB US TECHNIQUE: Both transabdominal and transvaginal ultrasound examinations were performed for complete evaluation of the gestation as well as the maternal uterus, adnexal regions, and pelvic cul-de-sac. Transvaginal technique was performed to assess early pregnancy. COMPARISON:  03/21/2015 FINDINGS: Intrauterine gestational sac: None Yolk sac:  No Embryo:  No Cardiac Activity: Not apply Heart Rate: Not apply  bpm Maternal uterus/adnexae: Subchorionic hemorrhage: Not apply Right ovary: Norm Left ovary: Normal Other :The endometrium appears thickened and heterogeneous in appearance with increased vascularity. The endometrium measures 19.5 mm. Free fluid:  No free fluid. IMPRESSION: 1. Resolution of previous intrauterine gestational sac containing embryo. 2. Thickened endometrium which measures 19.5 mm and demonstrates increased vascularity. Findings are concerning for retained products of conception (RPOC).  Electronically Signed   By: Kerby Moors M.D.   On: 03/31/2015 15:34     Review of Systems  Constitutional: Negative for fever and chills.  Gastrointestinal: Negative for abdominal pain.   Physical Exam   Blood pressure 132/82, pulse 74, temperature 98.4 F (36.9 C), temperature source Oral, resp. rate 18, height 5' (1.524 m), weight 116 lb (52.617 kg), last menstrual period 01/17/2015.  Physical Exam  Constitutional: She is oriented to person, place, and time. She appears well-developed and well-nourished. No distress.  HENT:  Head: Normocephalic.  Eyes: Pupils are equal, round, and reactive to light.  Musculoskeletal: Normal range of motion.  Neurological: She is alert and oriented to person, place, and time.  Skin: Skin is warm. She is not diaphoretic.  Psychiatric: Her behavior is normal.    MAU Course  Procedures  None  MDM  Spanish interpretor used Offered and recommended cytotec for retained products of conception and quant of 200.  Patient declines cytotec for retained products of conception. Patient would like to wait one week and follow up in the clinic and see if the beta hcg level drops on its own. Dr. Nehemiah Settle agreeable to plan of care.   Patient denies pain at this time   Assessment and Plan   A:  1. SAB (spontaneous abortion)   2. Retained products of conception following abortion    P:  Discharge home in stable condition  Follow up in the [place] in one week; clinic to call to schedule  Bleeding precautions Pelvic rest Support given   Lezlie Lye, NP 03/31/2015 6:48 PM

## 2015-03-31 NOTE — Discharge Instructions (Signed)
Aborto espontneo  (Miscarriage)  El aborto espontneo es la prdida de un beb que no ha nacido.(feto) antes de la semana 20 del embarazo. La causa generalmente es desconocida.  CUIDADOS EN EL HOGAR   Debe permanecer en cama (reposo en cama) o podr hacer actividades livianas. Regrese a sus actividades segn las indicaciones del mdico.  Pida ayuda con las tareas domsticas.  Anote cuntos apsitos [place] por da. Describa el grado en que estn empapados.  No use tampones. No se higienice la vagina (duchas vaginales) ni tenga relaciones sexuales (coito) hasta que el mdico la autorice.  Slo debe tomar la medicacin segn las indicaciones del mdico.  No tome aspirina.  Cumpla con los controles mdicos segn las indicaciones.  Si usted o su pareja tienen problemas con el duelo, hable con su mdico. Tambin puede intentar con psicoterapia. Permtase el tiempo suficiente de duelo antes de quedar embarazada nuevamente. SOLICITE AYUDA DE INMEDIATO SI:   Siente clicos intensos o dolor en el estmago, en la espalda o en el vientre (abdomen).  Tiene fiebre.  Elimina grumos de sangre (cogulos) por la vagina, que tienen el tamao de una nuez o ms. Guarde los cogulos para que el [employer] vea.  Elimina gran cantidad de tejidos por la vagina. Guarde lo que ha eliminado para que su mdico lo examine.  Aumenta el sangrado.  Observa una secrecin espesa, con mal olor (prdida) que proviene de la vagina.  Se siente mareada, dbil o se desvanece (se desmaya).  Siente escalofros. ASEGRESE DE QUE:   Comprende estas instrucciones.  Controlar su enfermedad.  Solicitar ayuda de inmediato si no mejora o si empeora.   Esta informacin no tiene Marine scientist el consejo del mdico. Asegrese de hacerle al mdico cualquier pregunta que tenga.   Document Released: 10/26/2011 Elsevier Interactive Patient Education [employer].

## 2015-03-31 NOTE — MAU Note (Signed)
Feeling ok, no pain, some spotting

## 2015-04-07 ENCOUNTER — Other Ambulatory Visit: Payer: Self-pay

## 2015-04-07 DIAGNOSIS — O039 Complete or unspecified spontaneous abortion without complication: Secondary | ICD-10-CM

## 2015-04-08 LAB — HCG, QUANTITATIVE, PREGNANCY: hCG, Beta Chain, Quant, S: 31.2 m[IU]/mL — ABNORMAL HIGH

## 2015-04-10 ENCOUNTER — Telehealth: Payer: Self-pay

## 2015-04-10 ENCOUNTER — Telehealth: Payer: Self-pay | Admitting: *Deleted

## 2015-04-10 NOTE — Telephone Encounter (Addendum)
Per Dr. Nehemiah Settle message needs pateint to be called and notified that her HCG significantly dropped.  No need to follow up unless no menses in 6-8 weeks.  Called patient with interpreter Windle Guard and left a message we are calling with some information , please call back.

## 2015-04-10 NOTE — Telephone Encounter (Signed)
Patient called Vaughan Basta back regarding her results. I read off Linda's note and advice patient of results per Dr.Stinson she understood and thanked [place]. I interpreted the call she only spoke spanish.

## 2015-04-11 NOTE — Telephone Encounter (Signed)
Called patient with Alyssa Wallace, no answer- left message stating we are trying to reach you with results, please call us back

## 2015-04-16 NOTE — Telephone Encounter (Signed)
Called pt with Spanish interpreter Verdis Frederickson, and informed pt that her beta levels have dropped and per provider there is no need for f/u unless she does not have a period in 6-8 wks.  Pt stated understanding with no further questions.

## 2016-01-24 ENCOUNTER — Encounter (HOSPITAL_COMMUNITY): Payer: Self-pay

## 2016-05-04 ENCOUNTER — Encounter: Payer: Self-pay | Admitting: Family Medicine

## 2016-05-10 NOTE — L&D Delivery Note (Signed)
Patient is 31 y.o. [id number] [redacted]w[redacted]d admitted PROM. S/p IOL with foley bulb, cytotec, followed by Pitocin.   Prenatal course also complicated by short cervix with cerclage, and prior C/S.  Delivery Note At 11:15 PM a viable female was delivered via VBAC, Spontaneous (Presentation:LOA asynclitic.  APGAR: 9, 10; weight 6 lb 8.2 oz (2955 g).   Placenta status: Delivered spontaneously with gentle cord traction.  Cord: 3 vessel. Cervical laceration noted on bimanual. Hemostatic. No repair of cervical laceration.   Anesthesia:  Epidural Episiotomy:  None Lacerations: 2nd degree Suture Repair: vicryl Est. Blood Loss (mL): 200  Mom to postpartum.  Baby to Couplet care / Skin to Skin.  Georgina Snell P Cox 12/14/2016, 12:36 AM    Upon arrival patient was complete and pushing. She pushed with good maternal effort to deliver a viable Female infant in cephalic, LOA position. Nuchal cord,easily reduced after delivery of body. Baby delivered without difficulty. Was noted to have good tone and place on maternal abdomen for oral suctioning, drying and stimulation. Delayed cord clamping performed. Placenta delivered spontaneously with gentle cord traction. Fundus firm with massage and Pitocin. Perineum inspected and found to have 2degree laceration, which was repaired with 3-0 vicryl with good hemostasis achieved. Bimanual exam noted cervical laceration, which was hemostatic. No repair needed Counts of sharps, instruments, and lap pads were all correct.   I was present, and gloved with the resident for the entire delivery. I agree with the above note.   Marcille Buffy 1:57 AM 12/14/16

## 2016-05-12 ENCOUNTER — Ambulatory Visit: Payer: Self-pay | Attending: Family Medicine | Admitting: Family Medicine

## 2016-05-12 ENCOUNTER — Encounter: Payer: Self-pay | Admitting: Family Medicine

## 2016-05-12 VITALS — BP 105/69 | HR 75 | Temp 98.1°F | Resp 16 | Wt 119.0 lb

## 2016-05-12 DIAGNOSIS — Z79899 Other long term (current) drug therapy: Secondary | ICD-10-CM | POA: Insufficient documentation

## 2016-05-12 DIAGNOSIS — Z0000 Encounter for general adult medical examination without abnormal findings: Secondary | ICD-10-CM | POA: Insufficient documentation

## 2016-05-12 DIAGNOSIS — Z9889 Other specified postprocedural states: Secondary | ICD-10-CM | POA: Insufficient documentation

## 2016-05-12 LAB — CBC WITH DIFFERENTIAL/PLATELET
BASOS ABS: 0 {cells}/uL (ref 0–200)
Basophils Relative: 0 %
EOS ABS: 92 {cells}/uL (ref 15–500)
EOS PCT: 2 %
HCT: 38 % (ref 35.0–45.0)
HEMOGLOBIN: 12.7 g/dL (ref 11.7–15.5)
LYMPHS ABS: 1564 {cells}/uL (ref 850–3900)
Lymphocytes Relative: 34 %
MCH: 33 pg (ref 27.0–33.0)
MCHC: 33.4 g/dL (ref 32.0–36.0)
MCV: 98.7 fL (ref 80.0–100.0)
MONO ABS: 414 {cells}/uL (ref 200–950)
MPV: 9.7 fL (ref 7.5–12.5)
Monocytes Relative: 9 %
NEUTROS ABS: 2530 {cells}/uL (ref 1500–7800)
Neutrophils Relative %: 55 %
Platelets: 237 10*3/uL (ref 140–400)
RBC: 3.85 MIL/uL (ref 3.80–5.10)
RDW: 12.9 % (ref 11.0–15.0)
WBC: 4.6 10*3/uL (ref 3.8–10.8)

## 2016-05-12 LAB — TSH: TSH: 2.16 m[IU]/L

## 2016-05-12 LAB — POCT URINE PREGNANCY: Preg Test, Ur: POSITIVE — AB

## 2016-05-12 MED ORDER — PRENATAL/FOLIC ACID PO TABS: 1.0000 | ORAL_TABLET | Freq: Every day | ORAL | 11 refills | Status: DC

## 2016-05-12 NOTE — Progress Notes (Signed)
Pt is in the office today for establish care Pt states she is having pain coming from her head and hip Pt states her head pain is a 5 and hip pain is a 2

## 2016-05-12 NOTE — Patient Instructions (Signed)
Preparacin para Water quality scientist (Preparing for Pregnancy) Antes de intentar quedar embarazada, haga una cita con el mdico (atencin previa a la concepcin). El objetivo es ayudarla a que tenga un embarazo saludable y sin Engineer, manufacturing. En la primera cita, el mdico:   Har un examen fsico completo, incluido un [place].  Har una historia clnica completa.  La aconsejar y la ayudar a Scientist, research (physical sciences) problema. LISTA DE VERIFICACIN PREVIA A LA CONCEPCIN A continuacin se incluye una lista de los temas bsicos que debe abarcar con el mdico en la visita previa a la concepcin:  Su historia clnica.  Informe al [employer] que padeci. Muchas de ellas pueden afectar el embarazo.  Incluya la historia clnica y los antecedentes familiares de su pareja.  Asegrese de haberse hecho estudios de deteccin de las enfermedades de transmisin sexual (ETS). Estas pueden afectar el [place], y, en algunos casos, pueden transmitirse al beb. Informe al mdico si tiene antecedentes de ETS.  Informe al mdico sobre cualquier problema previo que haya tenido en relacin con la concepcin o el embarazo.  Informe al [employer] que toma, entre ellos, los suplementos herbales y los medicamentos de [place].  Asegrese de [place]. Tal vez deba concertar ms citas.  Pregntele al mdico si debe recibir alguna vacuna o si hay alguna que debe evitar.  Dieta  Es muy importante seguir Ardelia Mems dieta equilibrada y saludable que contenga los nutrientes adecuados durante el [place].  Pdale al mdico que la ayude a Science writer un peso saludable antes del [place].  Si tiene sobrepeso, tiene ms riesgo de sufrir ciertas complicaciones, que incluyen hipertensin arterial, diabetes y Environmental education officer.  Si tiene [employer], es ms propensa a Best boy un beb con bajo peso al [employer].  Estilo de vida  Informe al [employer] cuestiones  relacionadas con el estilo de vida, por ejemplo, si consume alcohol o drogas, o si fuma.  Describa las sustancias dainas a las que puede estar expuesta en el trabajo o en su casa, entre otras, sustancias qumicas, plaguicidas y radiacin.  Salud mental  Informe al mdico si se ha sentido deprimida o ansiosa.  Informe al mdico si tiene antecedentes de consumo de drogas.  Informe al mdico si no se siente segura en su casa. INSTRUCCIONES PARA PREPARARSE PARA EL EMBARAZO EN EL HOGAR. Siga las indicaciones y los consejos del mdico.   Lleve un registro preciso de las [place], para que el mdico pueda determinar la fecha probable de parto con ms facilidad.  Empiece a tomar vitaminas prenatales y suplementos con cido flico diariamente. Tmelos como se lo haya indicado el mdico.  Consuma una dieta equilibrada. Pida ayuda a un asesor en nutricin si tiene preguntas o necesita ayuda.  Realice actividad fsica con regularidad. Intente realizar al menos 49minutos de [place] fsica todos o [employer].  Si fuma, deje de hacerlo.  No beba alcohol.  No consuma drogas.  Mantenga controlados los problemas mdicos, como la diabetes o hipertensin arterial.  Si tiene diabetes, haga lo siguiente:  Realice controles adecuados de la concentracin de Dispensing optician. Si tiene diabetes tipo1, aplquese varias dosis diarias de insulina. No use insulina en dosis dividida ni insulina premezclada.  Consulte a un oftalmlogo especialista en diabetes para que le realice un examen ocular.  El mdico debe evaluarla para detectar la presencia de enfermedades cardiovasculares.  Alcance un peso saludable. Si tiene sobrepeso u obesidad, adelgace con  la ayuda de un profesional mdico calificado, por ejemplo, un nutricionista matriculado. Pregntele al mdico cul es el rango de peso adecuado para usted. [place]? Puede estar embarazada si ha [employer] y no tuvo la [place]. Los sntomas de embarazo incipiente incluyen:   [place] leves.  Sangrado vaginal muy leve (manchado).  Cansancio poco frecuente.  Nuseas matinales. Si tiene alguno de [employer], hgase una prueba de embarazo casera. El objetivo de estas pruebas es Hydrographic surveyor la presencia de una hormona llamada gonadotropina corinica humana [facility]) en la orina. El organismo comienza a producir esta hormona al principio del Media planner. Estas pruebas son muy precisas. Espere por lo menos Management consultant de retraso de la [place]. Si el resultado es positivo, llame al mdico para Manufacturing systems engineer las citas para la atencin prenatal. QU DEBO HACER SI QUEDO EMBARAZADA?  Haga una cita con el mdico para la semana12 del [place], a ms tardar.  No fume. Fumar puede causar daos al beb.  No consuma bebidas alcohlicas. El alcohol se relaciona con ciertos defectos congnitos.  Evite los olores y las sustancias qumicas txicas.  Puede seguir teniendo [employer] si no le causan dolor u otros problemas, por ejemplo, sangrado vaginal. Esta informacin no tiene Marine scientist el consejo del mdico. Asegrese de hacerle al mdico cualquier pregunta que tenga. Document Released: 05/01/2013 Document Revised: 08/18/2015 Elsevier Interactive Patient Education  2017 [employer].

## 2016-05-12 NOTE — Progress Notes (Signed)
Subjective:   Patient ID: Alyssa Wallace, female    DOB: 1985/11/27, 31 y.o.   MRN: [id number]  Chief Complaint  Patient presents with  . Establish Care  . Annual Exam   HPI Alyssa Wallace 31 y.o. female presents for annual physical examination. She denies any CP,SOB, or swelling of legs. Denies any dizziness or lightheadedness. Denies vision changes. Report Denies any changes in bowel or bladder habits. Denies any drug or alcohol use. Pt.reports being 2 months pregnant. Reports receiving pregnancy results from clinic.  Past Medical History:  Diagnosis Date  . Preterm labor    Past Surgical History:  Procedure Laterality Date  . CESAREAN SECTION  04/05/2012   Procedure: CESAREAN SECTION;  Surgeon: Woodroe Mode, MD;  Location: [facility];  Service: Obstetrics;  Laterality: N/A;  Primary Cesarean Section Delivery Girl @ [phone], Apgars 5/8    Family History  Problem Relation Age of Onset  . Diabetes Mother   . Diabetes Father   . Diabetes Sister    Social History   Social History  . Marital status: Single    Spouse name: N/A  . Number of children: N/A  . Years of education: N/A   Occupational History  . Not on file.   Social History Main Topics  . Smoking status: Never Smoker  . Smokeless tobacco: Never Used  . Alcohol use No  . Drug use: No  . Sexual activity: Not Currently    Birth control/ protection: Condom   Outpatient Medications Prior to Visit  Medication Sig Dispense Refill  . Prenatal Vit-Fe Fumarate-FA (PRENATAL MULTIVITAMIN) TABS Take 1 tablet by mouth daily.    Marland Kitchen ibuprofen (ADVIL,MOTRIN) 200 MG tablet Take 200 mg by mouth every 6 (six) hours as needed for headache.     No facility-administered medications prior to visit.    No Known Allergies  Review of Systems  Constitutional: Negative.   HENT: Negative.   Eyes: Negative.   Respiratory: Negative.   Cardiovascular: Negative.   Gastrointestinal: Negative.   Genitourinary: Negative.     Musculoskeletal: Negative.   Skin: Negative.   Neurological: Negative.   Endo/Heme/Allergies: Negative.   Psychiatric/Behavioral: Negative.      Objective:    Physical Exam  Constitutional: She is oriented to person, place, and time. She appears well-developed and well-nourished.  HENT:  Right Ear: External ear normal.  Left Ear: External ear normal.  Mouth/Throat: Oropharynx is clear and moist.  Eyes: Conjunctivae are normal. Pupils are equal, round, and reactive to light.  Neck: Normal range of motion. Neck supple. No JVD present. No thyromegaly present.  Cardiovascular: Normal rate, regular rhythm, normal heart sounds and intact distal pulses.   Pulmonary/Chest: Effort normal and breath sounds normal.  Abdominal: Soft. Bowel sounds are normal.  Musculoskeletal: Normal range of motion.  Lymphadenopathy:    She has no cervical adenopathy.  Neurological: She is alert and oriented to person, place, and time. She displays a negative Romberg sign.  Skin: Skin is warm and dry.  Psychiatric: She has a normal mood and affect. Her behavior is normal. Thought content normal.   BP 105/69 (BP Location: Left Arm, Patient Position: Sitting, Cuff Size: Small)   Pulse 75   Temp 98.1 F (36.7 C) (Oral)   Resp 16   Wt 119 lb (54 kg)   SpO2 98%   BMI 23.24 kg/m  Wt Readings from Wallace 3 Encounters:  05/12/16 119 lb (54 kg)  03/31/15 116 lb (52.6 kg)  03/22/15 118 lb (  53.5 kg)    Immunization History  Administered Date(s) Administered  . Influenza Split 02/03/2012  . Influenza,inj,Quad PF,36+ Mos 01/08/2014  . Tdap 04/06/2012    Lab Results  Component Value Date   TSH 2.16 05/12/2016   Lab Results  Component Value Date   WBC 4.6 05/12/2016   HGB 12.7 05/12/2016   HCT 38.0 05/12/2016   MCV 98.7 05/12/2016   PLT 237 05/12/2016   Lab Results  Component Value Date   NA 138 05/12/2016   K 3.8 05/12/2016   CO2 23 05/12/2016   GLUCOSE 89 05/12/2016   BUN 9 05/12/2016    CREATININE 0.52 05/12/2016   BILITOT 0.3 04/27/2013   ALKPHOS 63 04/27/2013   AST 25 04/27/2013   ALT 17 04/27/2013   PROT 7.1 04/27/2013   ALBUMIN 4.0 04/27/2013   CALCIUM 8.9 05/12/2016   Lab Results  Component Value Date   CHOL 149 05/12/2016   Lab Results  Component Value Date   HDL 68 05/12/2016   Lab Results  Component Value Date   LDLCALC 66 05/12/2016   Lab Results  Component Value Date   TRIG 74 05/12/2016   Lab Results  Component Value Date   CHOLHDL 2.2 05/12/2016   Lab Results  Component Value Date   HGBA1C 5.4 01/08/2014     Assessment & Plan:   Problem List Items Addressed This Visit    None    Visit Diagnoses    Annual physical exam    -  Primary   Relevant Orders   Ambulatory referral to Ophthalmology   Ambulatory referral to Dentistry   Vitamin D, 25-hydroxy (Completed)   TSH (Completed)   Basic Metabolic Panel (Completed)   CBC with Differential (Completed)   POCT urine pregnancy (Completed)   Lipid panel (Completed)      I have discontinued Ms. Plair's prenatal multivitamin. I am also having her start on PRENATAL/FOLIC ACID. Additionally, I am having her maintain her ibuprofen.  Meds ordered this encounter  Medications  . Prenatal Vit-Fe Fumarate-FA (PRENATAL/FOLIC ACID) TABS    Sig: Take 1 tablet by mouth daily.    Dispense:  30 each    Refill:  11    Order Specific Question:   Supervising Provider    Answer:   Tresa Garter [id number]    Fredia Beets, FNP   `

## 2016-05-13 LAB — BASIC METABOLIC PANEL
BUN: 9 mg/dL (ref 7–25)
CHLORIDE: 104 mmol/L (ref 98–110)
CO2: 23 mmol/L (ref 20–31)
Calcium: 8.9 mg/dL (ref 8.6–10.2)
Creat: 0.52 mg/dL (ref 0.50–1.10)
Glucose, Bld: 89 mg/dL (ref 65–99)
POTASSIUM: 3.8 mmol/L (ref 3.5–5.3)
SODIUM: 138 mmol/L (ref 135–146)

## 2016-05-13 LAB — VITAMIN D 25 HYDROXY (VIT D DEFICIENCY, FRACTURES): Vit D, 25-Hydroxy: 30 ng/mL (ref 30–100)

## 2016-05-13 LAB — LIPID PANEL
CHOL/HDL RATIO: 2.2 ratio (ref <5.0)
Cholesterol: 149 mg/dL (ref <200)
HDL: 68 mg/dL (ref >50)
LDL Cholesterol: 66 mg/dL (ref <100)
Triglycerides: 74 mg/dL (ref <150)
VLDL: 15 mg/dL (ref <30)

## 2016-05-14 ENCOUNTER — Other Ambulatory Visit: Payer: Self-pay | Admitting: Family Medicine

## 2016-05-14 DIAGNOSIS — Z3201 Encounter for pregnancy test, result positive: Secondary | ICD-10-CM

## 2016-05-17 ENCOUNTER — Telehealth: Payer: Self-pay

## 2016-05-17 ENCOUNTER — Other Ambulatory Visit: Payer: Self-pay | Admitting: Family Medicine

## 2016-05-17 ENCOUNTER — Encounter (HOSPITAL_COMMUNITY): Payer: Self-pay | Admitting: *Deleted

## 2016-05-17 ENCOUNTER — Inpatient Hospital Stay (HOSPITAL_COMMUNITY)
Admission: AD | Admit: 2016-05-17 | Discharge: 2016-05-17 | Disposition: A | Discharge status: Home / Self Care | Payer: Medicaid Other | Source: Ambulatory Visit | Attending: Family Medicine | Admitting: Family Medicine

## 2016-05-17 ENCOUNTER — Inpatient Hospital Stay (HOSPITAL_COMMUNITY): Payer: Self-pay

## 2016-05-17 DIAGNOSIS — Z9889 Other specified postprocedural states: Secondary | ICD-10-CM | POA: Insufficient documentation

## 2016-05-17 DIAGNOSIS — O209 Hemorrhage in early pregnancy, unspecified: Secondary | ICD-10-CM

## 2016-05-17 DIAGNOSIS — N898 Other specified noninflammatory disorders of vagina: Secondary | ICD-10-CM | POA: Insufficient documentation

## 2016-05-17 DIAGNOSIS — Z3A08 8 weeks gestation of pregnancy: Secondary | ICD-10-CM | POA: Insufficient documentation

## 2016-05-17 DIAGNOSIS — Z833 Family history of diabetes mellitus: Secondary | ICD-10-CM | POA: Insufficient documentation

## 2016-05-17 DIAGNOSIS — Z3201 Encounter for pregnancy test, result positive: Secondary | ICD-10-CM

## 2016-05-17 DIAGNOSIS — Z79899 Other long term (current) drug therapy: Secondary | ICD-10-CM | POA: Insufficient documentation

## 2016-05-17 DIAGNOSIS — O3411 Maternal care for benign tumor of corpus uteri, first trimester: Secondary | ICD-10-CM | POA: Insufficient documentation

## 2016-05-17 DIAGNOSIS — D259 Leiomyoma of uterus, unspecified: Secondary | ICD-10-CM | POA: Insufficient documentation

## 2016-05-17 DIAGNOSIS — N939 Abnormal uterine and vaginal bleeding, unspecified: Secondary | ICD-10-CM

## 2016-05-17 LAB — URINALYSIS, ROUTINE W REFLEX MICROSCOPIC
Bilirubin Urine: NEGATIVE
Glucose, UA: NEGATIVE mg/dL
Hgb urine dipstick: NEGATIVE
KETONES UR: NEGATIVE mg/dL
LEUKOCYTES UA: NEGATIVE
Nitrite: NEGATIVE
PROTEIN: NEGATIVE mg/dL
Specific Gravity, Urine: 1.006 (ref 1.005–1.030)
pH: 6 (ref 5.0–8.0)

## 2016-05-17 LAB — WET PREP, GENITAL
Clue Cells Wet Prep HPF POC: NONE SEEN
SPERM: NONE SEEN
Trich, Wet Prep: NONE SEEN
Yeast Wet Prep HPF POC: NONE SEEN

## 2016-05-17 LAB — CBC
HEMATOCRIT: 37.2 % (ref 36.0–46.0)
HEMOGLOBIN: 13.4 g/dL (ref 12.0–15.0)
MCH: 33.4 pg (ref 26.0–34.0)
MCHC: 36 g/dL (ref 30.0–36.0)
MCV: 92.8 fL (ref 78.0–100.0)
Platelets: 241 10*3/uL (ref 150–400)
RBC: 4.01 MIL/uL (ref 3.87–5.11)
RDW: 12.3 % (ref 11.5–15.5)
WBC: 4.9 10*3/uL (ref 4.0–10.5)

## 2016-05-17 LAB — OB RESULTS CONSOLE GC/CHLAMYDIA: GC PROBE AMP, GENITAL: NEGATIVE

## 2016-05-17 LAB — HCG, QUANTITATIVE, PREGNANCY: HCG, BETA CHAIN, QUANT, S: 128977 m[IU]/mL — AB (ref <5)

## 2016-05-17 LAB — ABO/RH: ABO/RH(D): O POS

## 2016-05-17 NOTE — Telephone Encounter (Signed)
Patient verify DOB  Patient is aware her lab results being normal  Patient also is aware that her urine pregnancy was positive and recommend to still taking her prenatal vitamins.  Patient also stated in the phone call that this morning she was bleeding and that [facility].

## 2016-05-17 NOTE — MAU Provider Note (Signed)
History    Patient Alyssa Wallace is a [id number] at 8 weeks by LMP here with complaints of vaginal spotting.  CSN: [id number]  Arrival date and time: 05/17/16 1536   First Provider Initiated Contact with Patient 05/17/16 1706      Chief Complaint  Patient presents with  . Vaginal Bleeding   Vaginal Bleeding  The patient's primary symptoms include vaginal bleeding. The patient's pertinent negatives include no genital itching, genital lesions, genital odor, genital rash, missed menses, pelvic pain or vaginal discharge. This is a new problem. The current episode started today. The problem occurs 2 to 4 times per day. The problem has been unchanged. The patient is experiencing no pain. She is not pregnant. The vaginal discharge was bloody. The vaginal bleeding is spotting. She has not been passing clots. She has not been passing tissue. The symptoms are aggravated by bowel movements and urinating. She has tried nothing for the symptoms. Her past medical history is significant for a Cesarean section. There is no history of an ectopic pregnancy, endometriosis, a gynecological surgery, herpes simplex, menorrhagia, metrorrhagia, miscarriage, ovarian cysts, perineal abscess, PID, an STD, a terminated pregnancy or vaginosis.    OB History    Gravida Para Term Preterm AB Living   4 2 0 2 0 2   SAB TAB Ectopic Multiple Live Births   0 0 0 0 2      Past Medical History:  Diagnosis Date  . Preterm labor     Past Surgical History:  Procedure Laterality Date  . CESAREAN SECTION  04/05/2012   Procedure: CESAREAN SECTION;  Surgeon: Woodroe Mode, MD;  Location: [facility];  Service: Obstetrics;  Laterality: N/A;  Primary Cesarean Section Delivery Girl @ [phone], Apgars 5/8    Family History  Problem Relation Age of Onset  . Diabetes Mother   . Diabetes Father   . Diabetes Sister     Social History  Substance Use Topics  . Smoking status: Never Smoker  . Smokeless tobacco: Never Used  .  Alcohol use No    Allergies: No Known Allergies  Prescriptions Prior to Admission  Medication Sig Dispense Refill Last Dose  . ibuprofen (ADVIL,MOTRIN) 200 MG tablet Take 200 mg by mouth every 6 (six) hours as needed for headache.   Not Taking  . Prenatal Vit-Fe Fumarate-FA (PRENATAL/FOLIC ACID) TABS Take 1 tablet by mouth daily. 30 each 11     Review of Systems  Constitutional: Negative.   HENT: Negative.   Eyes: Negative.   Respiratory: Negative.   Cardiovascular: Negative.   Gastrointestinal: Negative.   Endocrine: Negative.   Genitourinary: Positive for vaginal bleeding. Negative for menorrhagia, missed menses, pelvic pain and vaginal discharge.  Musculoskeletal: Negative.   Allergic/Immunologic: Negative.   Neurological: Negative.   Psychiatric/Behavioral: Negative.    Physical Exam   Blood pressure 106/72, pulse 79, temperature 97.8 F (36.6 C), temperature source Oral, resp. rate 16, height 5' (1.524 m), weight 54.4 kg (120 lb), last menstrual period 03/22/2016, unknown if currently breastfeeding.  Physical Exam  Constitutional: She is oriented to person, place, and time. She appears well-developed.  HENT:  Head: Normocephalic.  Neck: Normal range of motion.  Respiratory: Effort normal. No respiratory distress. She has no wheezes. She has no rales. She exhibits no tenderness.  GI: Soft. She exhibits no distension and no mass. There is no tenderness. There is no rebound and no guarding.  Genitourinary: Vagina normal.  Genitourinary Comments:  Vaginal walls and external genitalia  normal appearing. Large polyp visualized in the cervix; friable. Non-tender. No CMT.   Musculoskeletal: Normal range of motion.  Neurological: She is alert and oriented to person, place, and time.  Skin: Skin is warm and dry.  Psychiatric: She has a normal mood and affect.    MAU Course  Procedures  MDM -UA -CBC, ABO, bHCG -[place] to rule out ectopic. US shows SIUP at 8 weeks with fetal  cardiac activity; FHR is 166 bpm.  -GC CT cultures pending Assessment and Plan   1. Vaginal bleeding before [redacted] weeks gestation   2. Vaginal bleeding    2. Discharge home. Discussed with patient that she may have some bleeding from her polyp and fibroid. Reassured her that pregnancy appears healthy; she will make an appointment with the [facility] to begin prenatal care.   3. Reviewed bleeding precautions, signs and symptoms of miscarriage (cramping, passing clots). Patient verbalized understanding.   Mervyn Skeeters Galen Russman CNM 05/17/2016, 5:22 PM

## 2016-05-17 NOTE — Discharge Instructions (Signed)
Primer trimestre de embarazo (First Trimester of Pregnancy) El primer trimestre de embarazo se extiende desde la semana1 hasta el final de la semana12 (mes1 al mes3). Una semana despus de que un espermatozoide fecunda un vulo, este se implantar en la pared uterina. Este embrin comenzar a desarrollarse hasta convertirse en un beb. Sus genes y los de su pareja forman el beb. Los genes del varn determinan si ser un nio o una nia. Entre la semana6 y la8, se forman los ojos y el rostro, y los latidos del corazn pueden verse en la ecografa. Al final de las 12semanas, todos los rganos del beb estn formados. Ahora que est embarazada, querr hacer todo lo que est a su alcance para tener un beb sano. Dos de las cosas ms importantes son tener una buena atencin prenatal y seguir las indicaciones del mdico. La atencin prenatal incluye toda la asistencia mdica que usted recibe antes del nacimiento del beb. Esta ayudar a prevenir, detectar y tratar cualquier problema durante el embarazo y el parto. CAMBIOS EN EL ORGANISMO Su organismo atraviesa por muchos cambios durante el embarazo, y estos varan de una mujer a otra.  Al principio, puede aumentar o bajar algunos kilos.  Puede tener malestar estomacal (nuseas) y vomitar. Si no puede controlar los vmitos, llame al mdico.  Puede cansarse con facilidad.  Es posible que tenga dolores de cabeza que pueden aliviarse con los medicamentos que el mdico le permita tomar.  Puede orinar con mayor frecuencia. El dolor al orinar puede significar que usted tiene una infeccin de la vejiga.  Debido al embarazo, puede tener acidez estomacal.  Puede estar estreida, ya que ciertas hormonas enlentecen los movimientos de los msculos que empujan los desechos a travs de los intestinos.  Pueden aparecer hemorroides o abultarse e hincharse las venas (venas varicosas).  Las mamas pueden empezar a agrandarse y estar sensibles. Los pezones  pueden sobresalir ms, y el tejido que los rodea (areola) tornarse ms oscuro.  Las encas pueden sangrar y estar sensibles al cepillado y al hilo dental.  Pueden aparecer zonas oscuras o manchas (cloasma, mscara del embarazo) en el rostro que probablemente se atenuarn despus del nacimiento del beb.  Los perodos menstruales se interrumpirn.  Tal vez no tenga apetito.  Puede sentir un fuerte deseo de consumir ciertos alimentos.  Puede tener cambios a nivel emocional da a da, por ejemplo, por momentos puede estar emocionada por el embarazo y por otros preocuparse porque algo pueda salir mal con el embarazo o el beb.  Tendr sueos ms vvidos y extraos.  Tal vez haya cambios en el cabello que pueden incluir su engrosamiento, crecimiento rpido y cambios en la textura. A algunas mujeres tambin se les cae el cabello durante o despus del embarazo, o tienen el cabello seco o fino. Lo ms probable es que el cabello se le normalice despus del nacimiento del beb. QU DEBE ESPERAR EN LAS CONSULTAS PRENATALES Durante una visita prenatal de rutina:  La pesarn para asegurarse de que usted y el beb estn creciendo normalmente.  Le controlarn la presin arterial.  Le medirn el abdomen para controlar el desarrollo del beb.  Se escucharn los latidos cardacos a partir de la semana10 o la12 de embarazo, aproximadamente.  Se analizarn los resultados de los estudios solicitados en visitas anteriores. El mdico puede preguntarle:  Cmo se siente.  Si siente los movimientos del beb.  Si ha tenido sntomas anormales, como prdida de lquido, sangrado, dolores de cabeza intensos o clicos abdominales.    Si est consumiendo algn producto que contenga tabaco, como cigarrillos, tabaco de mascar y cigarrillos electrnicos.  Si tiene alguna pregunta. Otros estudios que pueden realizarse durante el primer trimestre incluyen lo siguiente:  Anlisis de sangre para determinar el tipo  de sangre y detectar la presencia de infecciones previas. Adems, se los usar para controlar si los niveles de hierro son bajos (anemia) y determinar los anticuerpos Rh. En una etapa ms avanzada del embarazo, se harn anlisis de sangre para saber si tiene diabetes, junto con otros estudios si surgen problemas.  Anlisis de orina para detectar infecciones, diabetes o protenas en la orina.  Una ecografa para confirmar que el beb crece y se desarrolla correctamente.  Una amniocentesis para diagnosticar posibles problemas genticos.  Estudios del feto para descartar espina bfida y sndrome de Down.  Es posible que necesite otras pruebas adicionales.  Prueba del VIH (virus de inmunodeficiencia humana). Los exmenes prenatales de rutina incluyen la prueba de deteccin del VIH, a menos que decida no realizrsela. INSTRUCCIONES PARA EL CUIDADO EN EL HOGAR Medicamentos:  Siga las indicaciones del mdico en relacin con el uso de medicamentos. Durante el embarazo, hay medicamentos que pueden tomarse y otros que no.  Tome las vitaminas prenatales como se le indic.  Si est estreida, tome un laxante suave, si el mdico lo autoriza. Dieta  Consuma alimentos balanceados. Elija alimentos variados, como carne o protenas de origen vegetal, pescado, leche y productos lcteos descremados, verduras, frutas y panes y cereales integrales. El mdico la ayudar a determinar la cantidad de peso que puede aumentar.  No coma carne cruda ni quesos sin cocinar. Estos elementos contienen bacterias que pueden causar defectos congnitos en el beb.  La ingesta diaria de cuatro o cinco comidas pequeas en lugar de tres comidas abundantes puede ayudar a aliviar las nuseas y los vmitos. Si empieza a tener nuseas, comer algunas galletas saladas puede ser de ayuda. Beber lquidos entre las comidas en lugar de tomarlos durante las comidas tambin puede ayudar a calmar las nuseas y los vmitos.  Si est  estreida, consuma alimentos con alto contenido de fibra, como verduras y frutas frescas, y cereales integrales. Beba suficiente lquido para mantener la orina clara o de color amarillo plido. Actividad y ejercicios  Haga ejercicio solamente como se lo haya indicado el mdico. El ejercicio la ayudar a: ? Controlar el peso. ? Mantenerse en forma. ? Estar preparada para el trabajo de parto y el parto.  Los dolores, los clicos en la parte baja del abdomen o los calambres en la cintura son un buen indicio de que debe dejar de hacer ejercicios. Consulte al mdico antes de seguir haciendo ejercicios normales.  Intente no estar de pie durante mucho tiempo. Mueva las piernas con frecuencia si debe estar de pie en un lugar durante mucho tiempo.  Evite levantar pesos excesivos.  Use zapatos de tacones bajos y mantenga una buena postura.  Puede seguir teniendo relaciones sexuales, excepto que el mdico le indique lo contrario. Alivio del dolor o las molestias  Use un sostn que le brinde buen soporte si siente dolor a la palpacin en las mamas.  Dese baos de asiento con agua tibia para aliviar el dolor o las molestias causadas por las hemorroides. Use crema antihemorroidal si el mdico se lo permite.  Descanse con las piernas elevadas si tiene calambres o dolor de cintura.  Si tiene venas varicosas en las piernas, use medias de descanso. Eleve los pies durante 15minutos, 3 o 4veces por   da. Limite la cantidad de sal en su dieta. Cuidados prenatales  Programe las visitas prenatales para la semana12 de embarazo. Generalmente se programan cada mes al principio y se hacen ms frecuentes en los 2 ltimos meses antes del parto.  Escriba sus preguntas. Llvelas cuando concurra a las visitas prenatales.  Concurra a todas las visitas prenatales como se lo haya indicado el mdico. Seguridad  Colquese el cinturn de seguridad cuando conduzca.  Haga una lista de los nmeros de telfono de  emergencia, que incluya los nmeros de telfono de familiares, amigos, el hospital y los departamentos de polica y bomberos. Consejos generales  Pdale al mdico que la derive a clases de educacin prenatal en su localidad. Debe comenzar a tomar las clases antes de entrar en el mes6 de embarazo.  Pida ayuda si tiene necesidades nutricionales o de asesoramiento durante el embarazo. El mdico puede aconsejarla o derivarla a especialistas para que la ayuden con diferentes necesidades.  No se d baos de inmersin en agua caliente, baos turcos ni saunas.  No se haga duchas vaginales ni use tampones o toallas higinicas perfumadas.  No mantenga las piernas cruzadas durante mucho tiempo.  Evite el contacto con las bandejas sanitarias de los gatos y la tierra que estos animales usan. Estos elementos contienen bacterias que pueden causar defectos congnitos al beb y la posible prdida del feto debido a un aborto espontneo o muerte fetal.  No fume, no consuma hierbas ni medicamentos que no hayan sido recetados por el mdico. Las sustancias qumicas que estos productos contienen afectan la formacin y el desarrollo del beb.  No consuma ningn producto que contenga tabaco, lo que incluye cigarrillos, tabaco de mascar y cigarrillos electrnicos. Si necesita ayuda para dejar de fumar, consulte al mdico. Puede recibir asesoramiento y otro tipo de recursos para dejar de fumar.  Programe una cita con el dentista. En su casa, lvese los dientes con un cepillo dental blando y psese el hilo dental con suavidad. SOLICITE ATENCIN MDICA SI:  Tiene mareos.  Siente clicos leves, presin en la pelvis o dolor persistente en el abdomen.  Tiene nuseas, vmitos o diarrea persistentes.  Tiene secrecin vaginal con mal olor.  Siente dolor al orinar.  Tiene el rostro, las manos, las piernas o los tobillos ms hinchados.  SOLICITE ATENCIN MDICA DE INMEDIATO SI:  Tiene fiebre.  Tiene una prdida de  lquido por la vagina.  Tiene sangrado o pequeas prdidas vaginales.  Siente dolor intenso o clicos en el abdomen.  Sube o baja de peso rpidamente.  Vomita sangre de color rojo brillante o material que parezca granos de caf.  Ha estado expuesta a la rubola y no ha sufrido la enfermedad.  Ha estado expuesta a la quinta enfermedad o a la varicela.  Tiene un dolor de cabeza intenso.  Le falta el aire.  Sufre cualquier tipo de traumatismo, por ejemplo, debido a una cada o un accidente automovilstico.  Esta informacin no tiene como fin reemplazar el consejo del mdico. Asegrese de hacerle al mdico cualquier pregunta que tenga. Document Released: 02/03/2005 Document Revised: 05/17/2014 Document Reviewed: 03/06/2013 Elsevier Interactive Patient Education  2017 [employer]  

## 2016-05-17 NOTE — MAU Note (Signed)
Pt states she had some bleeding this morning, noted this morning with wiping after using BR - happened twice.  Had brown discharge afterwards.  Denies pain.

## 2016-05-17 NOTE — Telephone Encounter (Signed)
-----   Message from Alfonse Spruce, Irene sent at 05/17/2016  1:50 PM EST ----- -Labs were normal. -Urine pregnancy was positive. Continue to take pre-natal vitamins. -Referral will be made with ob/gyn for pregnancy care.

## 2016-05-19 LAB — GC/CHLAMYDIA PROBE AMP (~~LOC~~) NOT AT ARMC
CHLAMYDIA, DNA PROBE: NEGATIVE
Neisseria Gonorrhea: NEGATIVE

## 2016-06-03 ENCOUNTER — Other Ambulatory Visit (HOSPITAL_COMMUNITY): Payer: Self-pay | Admitting: Nurse Practitioner

## 2016-06-03 DIAGNOSIS — Z3A13 13 weeks gestation of pregnancy: Secondary | ICD-10-CM

## 2016-06-03 DIAGNOSIS — Z3682 Encounter for antenatal screening for nuchal translucency: Secondary | ICD-10-CM

## 2016-06-03 LAB — OB RESULTS CONSOLE RPR: RPR: NONREACTIVE

## 2016-06-03 LAB — OB RESULTS CONSOLE ABO/RH: RH TYPE: POSITIVE

## 2016-06-03 LAB — CYTOLOGY - PAP
Drug Screen, Urine: NEGATIVE
Glucose 1 Hr Prenatal, POC: 119 mg/dL
PAP SMEAR: NEGATIVE
Urine Culture, OB: NEGATIVE

## 2016-06-03 LAB — OB RESULTS CONSOLE RUBELLA ANTIBODY, IGM: RUBELLA: IMMUNE

## 2016-06-03 LAB — OB RESULTS CONSOLE VARICELLA ZOSTER ANTIBODY, IGG: Varicella: NON-IMMUNE/NOT IMMUNE

## 2016-06-03 LAB — OB RESULTS CONSOLE HIV ANTIBODY (ROUTINE TESTING): HIV: NONREACTIVE

## 2016-06-03 LAB — OB RESULTS CONSOLE ANTIBODY SCREEN: Antibody Screen: NEGATIVE

## 2016-06-03 LAB — CYSTIC FIBROSIS DIAGNOSTIC STUDY: INTERPRETATION-CFDNA: NEGATIVE

## 2016-06-03 LAB — OB RESULTS CONSOLE HEPATITIS B SURFACE ANTIGEN: HEP B S AG: NEGATIVE

## 2016-06-11 ENCOUNTER — Encounter: Payer: Self-pay | Admitting: *Deleted

## 2016-06-14 ENCOUNTER — Encounter: Payer: Self-pay | Admitting: Family Medicine

## 2016-06-14 ENCOUNTER — Ambulatory Visit (INDEPENDENT_AMBULATORY_CARE_PROVIDER_SITE_OTHER): Payer: Medicaid Other | Admitting: Family Medicine

## 2016-06-14 VITALS — BP 108/72 | HR 79 | Wt 116.6 lb

## 2016-06-14 DIAGNOSIS — O0991 Supervision of high risk pregnancy, unspecified, first trimester: Secondary | ICD-10-CM

## 2016-06-14 DIAGNOSIS — O0990 Supervision of high risk pregnancy, unspecified, unspecified trimester: Secondary | ICD-10-CM | POA: Insufficient documentation

## 2016-06-14 DIAGNOSIS — O09292 Supervision of pregnancy with other poor reproductive or obstetric history, second trimester: Secondary | ICD-10-CM

## 2016-06-14 DIAGNOSIS — O34219 Maternal care for unspecified type scar from previous cesarean delivery: Secondary | ICD-10-CM | POA: Insufficient documentation

## 2016-06-14 DIAGNOSIS — Z98891 History of uterine scar from previous surgery: Secondary | ICD-10-CM | POA: Insufficient documentation

## 2016-06-14 DIAGNOSIS — Z789 Other specified health status: Secondary | ICD-10-CM

## 2016-06-14 DIAGNOSIS — O09291 Supervision of pregnancy with other poor reproductive or obstetric history, first trimester: Secondary | ICD-10-CM | POA: Diagnosis present

## 2016-06-14 DIAGNOSIS — O09219 Supervision of pregnancy with history of pre-term labor, unspecified trimester: Secondary | ICD-10-CM | POA: Insufficient documentation

## 2016-06-14 LAB — POCT URINALYSIS DIP (DEVICE)
BILIRUBIN URINE: NEGATIVE
GLUCOSE, UA: NEGATIVE mg/dL
Hgb urine dipstick: NEGATIVE
KETONES UR: NEGATIVE mg/dL
LEUKOCYTES UA: NEGATIVE
NITRITE: NEGATIVE
PH: 7 (ref 5.0–8.0)
Protein, ur: NEGATIVE mg/dL
Specific Gravity, Urine: 1.01 (ref 1.005–1.030)
Urobilinogen, UA: 0.2 mg/dL (ref 0.0–1.0)

## 2016-06-14 MED ORDER — HYDROXYPROGESTERONE CAPROATE 250 MG/ML IM OIL
250.0000 mg | TOPICAL_OIL | Freq: Once | INTRAMUSCULAR | Status: AC
  Administered 2016-07-12: 250 mg via INTRAMUSCULAR

## 2016-06-14 NOTE — Progress Notes (Signed)
Spanish interpreter: Marly used   PRENATAL VISIT NOTE  Subjective:  Alyssa Wallace is a 31 y.o. [phone] at [redacted]w[redacted]d being seen today for transferring prenatal care from [facility] due to H/o PTB x 2.  She is currently monitored for the following issues for this high-risk pregnancy and has Supervision of high-risk pregnancy; Previous cesarean section complicating pregnancy; History of preterm premature rupture of membranes (PROM) in previous pregnancy, currently pregnant; and Language barrier on her problem list.  Patient reports no complaints.  Contractions: Not present. Vag. Bleeding: Scant.  Movement: Absent. Denies leaking of fluid.   The following portions of the patient's history were reviewed and updated as appropriate: allergies, current medications, past family history, past medical history, past social history, past surgical history and problem list. Problem list updated.  Objective:   Vitals:   06/14/16 0752  BP: 108/72  Pulse: 79  Weight: 116 lb 9.6 oz (52.9 kg)    Fetal Status: Fetal Heart Rate (bpm): 162   Movement: Absent     General:  Alert, oriented and cooperative. Patient is in no acute distress.  Skin: Skin is warm and dry. No rash noted.   Cardiovascular: Normal heart rate noted  Respiratory: Normal respiratory effort, no problems with respiration noted  Abdomen: Soft, gravid, appropriate for gestational age. Pain/Pressure: Present     Pelvic:  Cervical exam deferred        Extremities: Normal range of motion.  Edema: None  Mental Status: Normal mood and affect. Normal behavior. Normal judgment and thought content.   Assessment and Plan:  Pregnancy: [id number] at [redacted]w[redacted]d  1. Supervision of high risk pregnancy in first trimester For first screen--already scheduled  2. Previous cesarean section complicating pregnancy X 1-->offer VBAC later in pregnancy  3. History of preterm premature rupture of membranes (PROM) in previous pregnancy, currently pregnant in second  trimester X 2--application for Makena filled out - hydroxyprogesterone caproate (MAKENA) 250 mg/mL injection 250 mg; Inject 1 mL (250 mg total) into the muscle once.  4. Language barrier Interpreter used  General obstetric precautions including but not limited to vaginal bleeding, contractions, leaking of fluid and fetal movement were reviewed in detail with the patient. Please refer to After Visit Summary for other counseling recommendations.  Return in about 4 weeks (around 07/12/2016) for [facility] + 1st 17 P.   Donnamae Jude, MD

## 2016-06-14 NOTE — Patient Instructions (Signed)
Segundo trimestre de embarazo (Second Trimester of Pregnancy) El segundo trimestre va desde la semana13 hasta la 28, desde el cuarto hasta el sexto mes, y suele ser el momento en el que mejor se siente. Su organismo se ha adaptado a estar embarazada y comienza a sentirse fsicamente mejor. En general, las nuseas matutinas han disminuido o han desaparecido completamente, puede tener ms energa y un aumento de apetito. El segundo trimestre es tambin la poca en la que el feto se desarrolla rpidamente. Hacia el final del sexto mes, el feto mide aproximadamente 9pulgadas (23cm) y pesa alrededor de 1 libras (700g). Es probable que sienta que el beb se mueve (da pataditas) entre las 18 y 20semanas del embarazo. CAMBIOS EN EL ORGANISMO Su organismo atraviesa por muchos cambios durante el embarazo, y estos varan de una mujer a otra.  Seguir aumentando de peso. Notar que la parte baja del abdomen sobresale.  Podrn aparecer las primeras estras en las caderas, el abdomen y las mamas.  Es posible que tenga dolores de cabeza que pueden aliviarse con los medicamentos que el mdico le permita tomar.  Tal vez tenga necesidad de orinar con ms frecuencia porque el feto est ejerciendo presin sobre la vejiga.  Debido al embarazo podr sentir acidez estomacal con frecuencia.  Puede estar estreida, ya que ciertas hormonas enlentecen los movimientos de los msculos que empujan los desechos a travs de los intestinos.  Pueden aparecer hemorroides o abultarse e hincharse las venas (venas varicosas).  Puede tener dolor de espalda que se debe al aumento de peso y a que las hormonas del embarazo relajan las articulaciones entre los huesos de la pelvis, y como consecuencia de la modificacin del peso y los msculos que mantienen el equilibrio.  Las mamas seguirn creciendo y le dolern.  Las encas pueden sangrar y estar sensibles al cepillado y al hilo dental.  Pueden aparecer zonas oscuras o  manchas (cloasma, mscara del embarazo) en el rostro que probablemente se atenuar despus del nacimiento del beb.  Es posible que se forme una lnea oscura desde el ombligo hasta la zona del pubis (linea nigra) que probablemente se atenuar despus del nacimiento del beb.  Tal vez haya cambios en el cabello que pueden incluir su engrosamiento, crecimiento rpido y cambios en la textura. Adems, a algunas mujeres se les cae el cabello durante o despus del embarazo, o tienen el cabello seco o fino. Lo ms probable es que el cabello se le normalice despus del nacimiento del beb. QU DEBE ESPERAR EN LAS CONSULTAS PRENATALES Durante una visita prenatal de rutina:  La pesarn para asegurarse de que usted y el feto estn creciendo normalmente.  Le tomarn la presin arterial.  Le medirn el abdomen para controlar el desarrollo del beb.  Se escucharn los latidos cardacos fetales.  Se evaluarn los resultados de los estudios solicitados en visitas anteriores. El mdico puede preguntarle lo siguiente:  Cmo se siente.  Si siente los movimientos del beb.  Si ha tenido sntomas anormales, como prdida de lquido, sangrado, dolores de cabeza intensos o clicos abdominales.  Si est consumiendo algn producto que contenga tabaco, como cigarrillos, tabaco de mascar y cigarrillos electrnicos.  Si tiene alguna pregunta. Otros estudios que podrn realizarse durante el segundo trimestre incluyen lo siguiente:  Anlisis de sangre para detectar lo siguiente: ? Concentraciones de hierro bajas (anemia). ? Diabetes gestacional (entre la semana 24 y la 28). ? Anticuerpos Rh.  Anlisis de orina para detectar infecciones, diabetes o protenas en la orina.    Una ecografa para confirmar que el beb crece y se desarrolla correctamente.  Una amniocentesis para diagnosticar posibles problemas genticos.  Estudios del feto para descartar espina bfida y sndrome de Down.  Prueba del VIH (virus  de inmunodeficiencia humana). Los exmenes prenatales de rutina incluyen la prueba de deteccin del VIH, a menos que decida no realizrsela. INSTRUCCIONES PARA EL CUIDADO EN EL HOGAR  Evite fumar, consumir hierbas, beber alcohol y tomar frmacos que no le hayan recetado. Estas sustancias qumicas afectan la formacin y el desarrollo del beb.  No consuma ningn producto que contenga tabaco, lo que incluye cigarrillos, tabaco de mascar y cigarrillos electrnicos. Si necesita ayuda para dejar de fumar, consulte al mdico. Puede recibir asesoramiento y otro tipo de recursos para dejar de fumar.  Siga las indicaciones del mdico en relacin con el uso de medicamentos. Durante el embarazo, hay medicamentos que son seguros de tomar y otros que no.  Haga ejercicio solamente como se lo haya indicado el mdico. Sentir clicos uterinos es un buen signo para detener la actividad fsica.  Contine comiendo alimentos sanos con regularidad.  Use un sostn que le brinde buen soporte si le duelen las mamas.  No se d baos de inmersin en agua caliente, baos turcos ni saunas.  Use el cinturn de seguridad en todo momento mientras conduce.  No coma carne cruda ni queso sin cocinar; evite el contacto con las bandejas sanitarias de los gatos y la tierra que estos animales usan. Estos elementos contienen grmenes que pueden causar defectos congnitos en el beb.  Tome las vitaminas prenatales.  Tome entre 1500 y 2000mg de calcio diariamente comenzando en la semana20 del embarazo hasta el parto.  Si est estreida, pruebe un laxante suave (si el mdico lo autoriza). Consuma ms alimentos ricos en fibra, como vegetales y frutas frescos y cereales integrales. Beba gran cantidad de lquido para mantener la orina de tono claro o color amarillo plido.  Dese baos de asiento con agua tibia para aliviar el dolor o las molestias causadas por las hemorroides. Use una crema para las hemorroides si el mdico la  autoriza.  Si tiene venas varicosas, use medias de descanso. Eleve los pies durante 15minutos, 3 o 4veces por da. Limite el consumo de sal en su dieta.  No levante objetos pesados, use zapatos de tacones bajos y mantenga una buena postura.  Descanse con las piernas elevadas si tiene calambres o dolor de cintura.  Visite a su dentista si an no lo ha hecho durante el embarazo. Use un cepillo de dientes blando para higienizarse los dientes y psese el hilo dental con suavidad.  Puede seguir manteniendo relaciones sexuales, a menos que el mdico le indique lo contrario.  Concurra a todas las visitas prenatales segn las indicaciones de su mdico.  SOLICITE ATENCIN MDICA SI:  Tiene mareos.  Siente clicos leves, presin en la pelvis o dolor persistente en el abdomen.  Tiene nuseas, vmitos o diarrea persistentes.  Observa una secrecin vaginal con mal olor.  Siente dolor al orinar.  SOLICITE ATENCIN MDICA DE INMEDIATO SI:  Tiene fiebre.  Tiene una prdida de lquido por la vagina.  Tiene sangrado o pequeas prdidas vaginales.  Siente dolor intenso o clicos en el abdomen.  Sube o baja de peso rpidamente.  Tiene dificultad para respirar y siente dolor de pecho.  Sbitamente se le hinchan mucho el rostro, las manos, los tobillos, los pies o las piernas.  No ha sentido los movimientos del beb durante una hora.    dolor de cabeza intenso que no se alivia con medicamentos.  Su visin se modifica. Esta informacin no tiene Marine scientist el consejo del mdico. Asegrese de hacerle al mdico cualquier pregunta que tenga. Document Released: 02/03/2005 Document Revised: 05/17/2014 Document Reviewed: 06/27/2012 Elsevier Interactive Patient Education  2017 [place] (Breastfeeding) Decidir Economist es una de las mejores elecciones que puede hacer por usted y su beb. El cambio hormonal durante el Media planner produce el desarrollo del  tejido mamario y [place] la cantidad y el tamao de los conductos galactforos. Estas hormonas tambin permiten que las protenas, los azcares y las grasas de la sangre produzcan la Northeast Utilities materna en las glndulas productoras de [place]. Las hormonas impiden que la leche materna sea liberada antes del nacimiento del beb, adems de impulsar el flujo de leche luego del nacimiento. Una vez que ha comenzado a Economist, Freight forwarder beb, as Therapist, occupational succin o Social research officer, government, pueden estimular la liberacin de [place] de las glndulas productoras de [place]. LOS BENEFICIOS DE AMAMANTAR Para el beb   La primera leche (calostro) ayuda a Garment/textile technologist funcionamiento del sistema digestivo del beb.  La leche tiene anticuerpos que ayudan a Chemical engineer las infecciones en el beb.  El beb tiene una menor incidencia de asma, alergias y del sndrome de muerte sbita del lactante.  Los nutrientes en la [place]ace materna son mejores para el beb que la [place] maternizada y estn preparados exclusivamente para cubrir las necesidades del beb.  La leche materna mejora el desarrollo cerebral del beb.  Es menos probable que el beb desarrolle otras enfermedades, como obesidad infantil, asma o diabetes mellitus de tipo 2. Para usted   La lactancia materna favorece el desarrollo de un vnculo muy especial entre la madre y el beb.  Es conveniente. La leche materna siempre est disponible a la Tree surgeon y es [place].  La lactancia materna ayuda a quemar caloras y a perder el peso ganado durante el [place].  Favorece la contraccin del tero al tamao que tena antes del embarazo de manera ms rpida y disminuye el sangrado (loquios) despus del parto.  La lactancia materna contribuye a reducir Catering manager de desarrollar diabetes mellitus de tipo 2, osteoporosis o cncer de mama o de ovario en el futuro. SIGNOS DE QUE EL BEB EST HAMBRIENTO Primeros signos de hambre   Aumenta su estado de [place].  Se  estira.  Mueve la cabeza de un lado a otro.  Mueve la cabeza y abre la boca cuando se le toca la mejilla o la comisura de la boca (reflejo de bsqueda).  [place] vocalizaciones, tales como sonidos de succin, se relame los labios, emite arrullos, suspiros, o chirridos.  Mueve la [employer] boca.  Se chupa con ganas los dedos o las manos. Signos tardos de V[employer] Corporation.  Llora de manera intermitente. Signos de Weyerhaeuser Company signos de hambre extrema requerirn que lo calme y lo consuele antes de que el beb pueda alimentarse adecuadamente. No espere a que se manifiesten los siguientes signos de hambre extrema para comenzar a Economist:  Air cabin crew.  Llanto intenso y fuerte.  Gritos. INFORMACIN BSICA SOBRE LA LACTANCIA MATERNA Iniciacin de la lactancia materna   Encuentre un lugar cmodo para sentarse o acostarse, con un buen respaldo para el cuello y la espalda.  Coloque una almohada o una manta enrollada debajo del beb para acomodarlo a la altura de la mama (si est sentada).  Las almohadas para Economist se han diseado especialmente a fin de servir de apoyo para los brazos y el beb Kellogg.  Asegrese de que el abdomen del beb est frente al suyo.  Masajee suavemente la mama. Con las yemas de los dedos, masajee la pared del pecho hacia el pezn en un movimiento circular. Esto estimula el flujo de [place]. Es posible que Oceanographer este movimiento mientras amamanta si la leche fluye lentamente.  Sostenga la mama con el pulgar por arriba del pezn y los otros 4 dedos por debajo de la mama. Asegrese de que los dedos se encuentren lejos del pezn y de la boca del beb.  Empuje suavemente los labios del beb con el pezn o con el dedo.  Cuando la boca del beb se abra lo suficiente, acrquelo rpidamente a la mama e introduzca todo el pezn y la zona oscura que lo rodea (areola), tanto como sea posible, dentro de la boca del beb.  Debe  haber ms areola visible por arriba del labio superior del beb que por debajo del labio inferior.  La lengua del beb debe estar entre la enca inferior y la [place].  Asegrese de que la boca del beb est en la posicin correcta alrededor del pezn (prendida). Los labios del beb deben crear un sello sobre la mama y estar doblados hacia afuera (invertidos).  Es comn que el beb succione durante 2 a 3 minutos para que comience el flujo de [place]. Cmo debe prenderse  Es muy importante que le ensee al beb cmo prenderse adecuadamente a la mama. Si el beb no se prende adecuadamente, puede causarle dolor en el pezn y reducir la produccin de [place], y hacer que el beb tenga un escaso aumento de [place]. Adems, si el beb no se prende adecuadamente al pezn, puede tragar aire durante la alimentacin. Esto puede causarle molestias al beb. Hacer eructar al beb al Elie[place] Lofts de mama puede ayudarlo a liberar el aire. Sin embargo, ensearle al beb cmo prenderse a la mama adecuadamente es la mejor manera de evitar que se sienta molesto por tragar Administrator, sports se alimenta. Signos de que el beb se ha prendido adecuadamente al pezn:  Hall Busing o succiona de modo silencioso, sin causarle dolor.  Se escucha que traga cada 3 o 4 succiones.  Hay movimientos musculares por arriba y por delante de sus odos al Mining engineer. Signos de que el beb no se ha prendido Product manager al pezn:  Hace ruidos de succin o de chasquido mientras se alimenta.  Siente dolor en el pezn. Si cree que el beb no se prendi correctamente, deslice el dedo en la comisura de la boca y Micron Technology las encas del beb para interrumpir la succin. Intente comenzar a amamantar nuevamente. Signos de Economist  Signos del beb:  Disminuye gradualmente el nmero de succiones o cesa la succin por completo.  Se duerme.  Relaja el cuerpo.  Retiene una pequea cantidad de [employer] boca.  Se  desprende solo del pecho. Signos que presenta usted:  Las mamas han aumentado la firmeza, el peso y el tamao 1 a 3 horas despus de Economist.  Estn ms blandas inmediatamente despus de amamantar.  Un aumento del volumen de [place], y tambin un cambio en su consistencia y color se producen hacia el [place] de Therapist, nutritional.  Los pezones no duelen, ni estn agrietados ni sangran. Signos de que su beb recibe la cantidad de Northeast Utilities suficiente   Mojar  por lo menos 1 o 2 paales durante las primeras 24 horas despus del nacimiento.  Mojar por lo menos 5 o 6 paales cada 24 horas durante la primera semana despus del nacimiento. La orina debe ser transparente o de color amarillo plido a los 5 das despus del nacimiento.  Mojar entre 6 y 66 paales cada 73 horas a medida que el beb sigue creciendo y desarrollndose.  Defeca al menos 3 veces en 24 horas a los 5 das de vida. La materia fecal debe ser blanda y [place].  Defeca al menos 3 veces en 24 horas a los 7 das de vida. La materia fecal debe ser grumosa y [place].  No registra una prdida de peso mayor del 10% del peso al nacer durante los primeros [place].  Aumenta de peso un promedio de 4 a 7onzas (113 a 198g) por semana despus de los [place].  Aumenta de [place], [place], de [place] uniforme a Proofreader de los 5 das de vida, sin Museum/gallery curator prdida de peso despus de las 2semanas de vida. Despus de alimentarse, es posible que el beb regurgite una pequea cantidad. Esto es frecuente. FRECUENCIA Y DURACIN DE LA LACTANCIA MATERNA El amamantamiento frecuente la ayudar a producir ms [place] y a Warden/ranger de Social research officer, government en los pezones e hinchazn en las [place]. Alimente al beb cuando muestre signos de hambre o si siente la necesidad de reducir la congestin de las [place]. Esto se denomina "lactancia a demanda". Evite el uso del chupete mientras trabaja para establecer la lactancia (las primeras 4 a 6  semanas despus del nacimiento del beb). Despus de este perodo, podr ofrecerle un chupete. Las investigaciones demostraron que el uso del chupete durante el primer ao de vida del beb disminuye el riesgo de desarrollar el sndrome de muerte sbita del lactante (SMSL). Permita que el nio se alimente en cada mama todo lo que desee. Contine amamantando al beb hasta que haya terminado de alimentarse. Cuando el beb se desprende o se queda dormido mientras se est alimentando de la primera mama, ofrzcale la segunda. Debido a que, con frecuencia, los recin [place] las primeras semanas de vida, es posible que deba despertar al beb para alimentarlo. Los horarios de Writer de un beb a otro. Sin embargo, las siguientes reglas pueden servir como gua para ayudarla a Engineer, materials que el beb se alimenta adecuadamente:  Se puede amamantar a los recin nacidos (bebs de 4 semanas o menos de vida) cada 1 a 3 horas.  No deben transcurrir ms de 3 horas durante el da o 5 horas durante la noche sin que se amamante a los recin nacidos.  Debe amamantar al beb 8 veces como mnimo en un perodo de 24 horas, hasta que comience a introducir slidos en su dieta, a los 6 meses de vida aproximadamente. [place] extraccin y Recruitment consultant de la leche materna le permiten asegurarse de que el beb se alimente exclusivamente de [place], aun en momentos en los que no puede amamantar. Esto tiene especial importancia si debe regresar al [place] en el perodo en que an est amamantando o si no puede estar presente en los momentos en que el beb debe alimentarse. Su asesor en lactancia puede orientarla sobre cunto tiempo es [place]. El sacaleche es un aparato que le permite extraer leche de la mama a un recipiente estril. Luego, la leche materna extrada puede almacenarse en un refrigerador o Pension scheme manager.  Algunos sacaleches son Theodore Demark,  James Ivanoff otros son elctricos. Consulte a su asesor en lactancia qu tipo ser ms conveniente para usted. Los sacaleches se pueden comprar; sin embargo, algunos hospitales y grupos de apoyo a la lactancia materna alquilan Production assistant, radio. Un asesor en lactancia puede ensearle cmo extraer OfficeMax [employer]orporated, en caso de que prefiera no usar un sacaleche. CMO CUIDAR LAS MAMAS DURANTE LA LACTANCIA MATERNA Los pezones se secan, agrietan y duelen durante la Therapist, nutritional. Las siguientes recomendaciones pueden ayudarla a Theatre manager las [employer] y sanas:  Art therapist usar jabn en los pezones.  Use un sostn de soporte. Aunque no son esenciales, las camisetas sin mangas o los sostenes especiales para Economist estn diseados para acceder fcilmente a las mamas, para Economist sin tener que quitarse todo el sostn o la camiseta. Evite usar sostenes con aro o sostenes muy ajustados.  Seque al aire sus pezones durante 3 a 71minutos despus de amamantar al beb.  Utilice solo apsitos de Chiropodist sostn para Tax adviser las prdidas de [address]. La prdida de un poco de Owens [employer]ning tomas es normal.  Utilice lanolina sobre los pezones luego de Economist. La lanolina ayuda a mantener la humedad normal de la piel. Si [place] lanolina pura, no tiene que lavarse los pezones antes de volver a Research scientist (life sciences) al beb. La lanolina pura no es txica para el beb. Adems, puede extraer Cisco algunas gotas de [place] materna y Community education officer suavemente esa [employer], para que la [place] se seque al aire. Durante las primeras semanas despus de dar a luz, algunas mujeres pueden experimentar hinchazn en las mamas (congestin [place]). La congestin puede hacer que sienta las mamas pesadas, calientes y sensibles al tacto. El pico de la congestin ocurre dentro de los 3 a 5 das despus del [place]. Las siguientes recomendaciones pueden ayudarla a Public house manager la congestin:  Vace por completo  las mamas al [place]. Puede aplicar calor hmedo en las mamas (en la ducha o con toallas hmedas para manos) antes de Economist o extraer Northeast Utilities. Esto aumenta la circulacin y [place] a que la [place]. Si el beb no vaca por completo las mamas cuando lo amamanta, extraiga la [place] restante despus de que haya finalizado.  Use un sostn ajustado (para amamantar o comn) o una camiseta sin mangas durante 1 o 2 das para indicar al cuerpo que disminuya ligeramente la produccin de [place].  Aplique compresas de hielo E[employer], a menos que le resulte demasiado incmodo.  Asegrese de que el beb est prendido y se encuentre en la posicin correcta mientras lo alimenta. Si la congestin persiste luego de 48 horas o despus de seguir estas recomendaciones, comunquese con su mdico o un Lobbyist. RECOMENDACIONES GENERALES PARA EL CUIDADO DE LA SALUD DURANTE LA LACTANCIA MATERNA  Consuma alimentos saludables. [place] comidas y colaciones, y coma 3 de cada una por da. Dado que lo que come Solectron Corporation, es posible que algunas comidas hagan que su beb se vuelva ms irritable de lo habitual. Evite comer este tipo de alimentos si percibe que afectan de manera negativa al beb.  Beba leche, jugos de fruta y agua para Engineer, water su sed (aproximadamente [place]).  Descanse con frecuencia, reljese y tome sus vitaminas prenatales para evitar la fatiga, el estrs y la anemia.  Contine con los autocontroles de la mama.  Evite Engineer, manufacturing systems y fumar tabaco. Las sustancias qumicas de los cigarrillos que pasan  a la SLM Corporation y la exposicin al humo ambiental del tabaco pueden daar al beb.  No consuma alcohol ni drogas, incluida la marihuana. Algunos medicamentos, que pueden ser perjudiciales para el beb, pueden pasar a travs de la SLM Corporation. Es importante que consulte a su mdico antes de Medical sales representative, incluidos todos los medicamentos  recetados y de [place], as como los suplementos vitamnicos y herbales. Puede quedar embarazada durante la lactancia. Si desea controlar la natalidad, consulte a su mdico cules son las opciones ms seguras para el beb. SOLICITE ATENCIN MDICA SI:  Usted siente que quiere dejar de Economist o se siente frustrada con la lactancia.  Siente dolor en las mamas o en los pezones.  Sus pezones estn agrietados o Control and instrumentation engineer.  Sus pechos estn irritados, sensibles o calientes.  Tiene un rea hinchada en cualquiera de las mamas.  Siente escalofros o fiebre.  Tiene nuseas o vmitos.  Presenta una secrecin de otro lquido distinto de la leche materna de los pezones.  Sus mamas no se llenan antes de Economist al beb para el quinto da despus del [place].  Se siente triste y deprimida.  El beb est demasiado somnoliento como para comer bien.  El beb tiene problemas para dormir.  Moja menos de 3 paales en 24 horas.  Defeca menos de 3 veces en 24 horas.  La piel del beb o la parte blanca de los ojos se vuelven amarillentas.  El beb no ha aumentado de [place] a los [place]. SOLICITE ATENCIN MDICA DE INMEDIATO SI:  El beb est muy cansado Engineer, manufacturing) y no se quiere despertar para comer.  Le sube la fiebre sin causa. Esta informacin no tiene Marine scientist el consejo del mdico. Asegrese de hacerle al mdico cualquier pregunta que tenga. Document Released: 04/26/2005 Document Revised: 08/18/2015 Document Reviewed: 10/18/2012 Elsevier Interactive Patient Education  2017 [employer].

## 2016-06-21 ENCOUNTER — Other Ambulatory Visit: Payer: Self-pay

## 2016-06-21 ENCOUNTER — Other Ambulatory Visit (HOSPITAL_COMMUNITY): Payer: Self-pay | Admitting: Nurse Practitioner

## 2016-06-21 ENCOUNTER — Ambulatory Visit (HOSPITAL_COMMUNITY)
Admission: RE | Admit: 2016-06-21 | Discharge: 2016-06-21 | Disposition: A | Discharge status: Home / Self Care | Payer: Self-pay | Source: Ambulatory Visit | Attending: Nurse Practitioner | Admitting: Nurse Practitioner

## 2016-06-21 ENCOUNTER — Ambulatory Visit (INDEPENDENT_AMBULATORY_CARE_PROVIDER_SITE_OTHER): Payer: Medicaid Other | Admitting: *Deleted

## 2016-06-21 ENCOUNTER — Encounter (HOSPITAL_COMMUNITY): Payer: Self-pay

## 2016-06-21 DIAGNOSIS — Z202 Contact with and (suspected) exposure to infections with a predominantly sexual mode of transmission: Secondary | ICD-10-CM

## 2016-06-21 DIAGNOSIS — Z3A13 13 weeks gestation of pregnancy: Secondary | ICD-10-CM | POA: Insufficient documentation

## 2016-06-21 DIAGNOSIS — O09899 Supervision of other high risk pregnancies, unspecified trimester: Secondary | ICD-10-CM

## 2016-06-21 DIAGNOSIS — Z3682 Encounter for antenatal screening for nuchal translucency: Secondary | ICD-10-CM | POA: Insufficient documentation

## 2016-06-21 DIAGNOSIS — Z113 Encounter for screening for infections with a predominantly sexual mode of transmission: Secondary | ICD-10-CM

## 2016-06-21 DIAGNOSIS — N898 Other specified noninflammatory disorders of vagina: Secondary | ICD-10-CM | POA: Diagnosis not present

## 2016-06-21 DIAGNOSIS — O09219 Supervision of pregnancy with history of pre-term labor, unspecified trimester: Secondary | ICD-10-CM | POA: Insufficient documentation

## 2016-06-21 NOTE — Progress Notes (Signed)
Pt reports yellow vaginal discharge. Pt performed self swab. Informed we will call if any abnormal results.

## 2016-06-22 LAB — CERVICOVAGINAL ANCILLARY ONLY
BACTERIAL VAGINITIS: NEGATIVE
CHLAMYDIA, DNA PROBE: NEGATIVE
Candida vaginitis: POSITIVE — AB
NEISSERIA GONORRHEA: NEGATIVE
Trichomonas: NEGATIVE

## 2016-06-23 ENCOUNTER — Telehealth: Payer: Self-pay | Admitting: *Deleted

## 2016-06-23 MED ORDER — TERCONAZOLE 0.4 % VA CREA: TOPICAL_CREAM | VAGINAL | 0 refills | Status: DC

## 2016-06-23 NOTE — Telephone Encounter (Addendum)
[place], provided requested information and scheduled delivery of medication on 07/08/16.  Pt has appt scheduled on 3/5 for Ob fu and to receive first Makena injection. Pt was contacted with [facility] interpreter # [phone] and given this information. She voiced understanding.

## 2016-06-23 NOTE — Telephone Encounter (Signed)
Alyssa Wallace from [employer] left message. She needs to verify patient info and schedule shipment of Makena. Please return call [phone].

## 2016-06-23 NOTE — Telephone Encounter (Signed)
Called pt with [place] interpreter # [phone] and informed her of +vaginal yeast. Administration instructions given. Pt advised that Rx has been sent to her pharmacy. Pt voiced understanding.

## 2016-07-12 ENCOUNTER — Ambulatory Visit (INDEPENDENT_AMBULATORY_CARE_PROVIDER_SITE_OTHER): Payer: Self-pay | Admitting: Obstetrics & Gynecology

## 2016-07-12 VITALS — BP 91/55 | HR 74 | Wt 116.3 lb

## 2016-07-12 DIAGNOSIS — O0992 Supervision of high risk pregnancy, unspecified, second trimester: Secondary | ICD-10-CM

## 2016-07-12 DIAGNOSIS — O09292 Supervision of pregnancy with other poor reproductive or obstetric history, second trimester: Secondary | ICD-10-CM

## 2016-07-12 NOTE — Progress Notes (Signed)
Anatomy US scheduled for March 22nd @ 0900.  Pt notified.

## 2016-07-12 NOTE — Patient Instructions (Signed)

## 2016-07-12 NOTE — Progress Notes (Signed)
Used [employer] video interpreter Lannette Donath [phone].c/o headaches sometimes - relieved with tylenol mostly.

## 2016-07-12 NOTE — Progress Notes (Signed)
   PRENATAL VISIT NOTE  Subjective:  Alyssa Wallace is a 31 y.o. [id number] at [redacted]w[redacted]d being seen today for ongoing prenatal care.  She is currently monitored for the following issues for this high-risk pregnancy and has Supervision of high-risk pregnancy; Previous cesarean section complicating pregnancy; History of preterm premature rupture of membranes (PROM) in previous pregnancy, currently pregnant; and Language barrier on her problem list.  Patient reports no complaints.  Contractions: Not present. Vag. Bleeding: None.  Movement: Present. Denies leaking of fluid.   The following portions of the patient's history were reviewed and updated as appropriate: allergies, current medications, past family history, past medical history, past social history, past surgical history and problem list. Problem list updated.  Objective:   Vitals:   07/12/16 0817  BP: (!) 91/55  Pulse: 74  Weight: 116 lb 4.8 oz (52.8 kg)    Fetal Status: Fetal Heart Rate (bpm): 145   Movement: Present     General:  Alert, oriented and cooperative. Patient is in no acute distress.  Skin: Skin is warm and dry. No rash noted.   Cardiovascular: Normal heart rate noted  Respiratory: Normal respiratory effort, no problems with respiration noted  Abdomen: Soft, gravid, appropriate for gestational age. Pain/Pressure: Present     Pelvic:  Cervical exam deferred        Extremities: Normal range of motion.  Edema: None  Mental Status: Normal mood and affect. Normal behavior. Normal judgment and thought content.   Assessment and Plan:  Pregnancy: [id number] at [redacted]w[redacted]d  1. Supervision of high risk pregnancy in second trimester - [place] MFM OB COMP + 14 WK; Future  2. History of preterm premature rupture of membranes (PROM) in previous pregnancy, currently pregnant in second trimester  - [place] MFM OB COMP + 25 WK; Future  Preterm labor symptoms and general obstetric precautions including but not limited to vaginal bleeding,  contractions, leaking of fluid and fetal movement were reviewed in detail with the patient. Please refer to After Visit Summary for other counseling recommendations.  Return in about 4 weeks (around 08/09/2016). AFP today  Woodroe Mode, MD

## 2016-07-18 LAB — AFP, SERUM, OPEN SPINA BIFIDA
AFP MOM: 1.12
AFP VALUE AFPOSL: 43.8 ng/mL
Gest. Age on Collection Date: 16 weeks
MATERNAL AGE AT EDD: 32.6 a
OSBR Risk 1 IN: 8371
TEST RESULTS AFP: NEGATIVE
WEIGHT: 116 [lb_av]

## 2016-07-19 ENCOUNTER — Ambulatory Visit (INDEPENDENT_AMBULATORY_CARE_PROVIDER_SITE_OTHER): Payer: Self-pay | Admitting: *Deleted

## 2016-07-19 ENCOUNTER — Encounter: Payer: Self-pay | Admitting: *Deleted

## 2016-07-19 VITALS — BP 98/54 | HR 82

## 2016-07-19 DIAGNOSIS — O09212 Supervision of pregnancy with history of pre-term labor, second trimester: Secondary | ICD-10-CM

## 2016-07-19 DIAGNOSIS — O09899 Supervision of other high risk pregnancies, unspecified trimester: Secondary | ICD-10-CM

## 2016-07-19 DIAGNOSIS — O09219 Supervision of pregnancy with history of pre-term labor, unspecified trimester: Principal | ICD-10-CM

## 2016-07-19 MED ORDER — HYDROXYPROGESTERONE CAPROATE 250 MG/ML IM OIL
250.0000 mg | TOPICAL_OIL | INTRAMUSCULAR | Status: AC
  Administered 2016-07-19 – 2016-11-25 (×18): 250 mg via INTRAMUSCULAR

## 2016-07-26 ENCOUNTER — Ambulatory Visit (INDEPENDENT_AMBULATORY_CARE_PROVIDER_SITE_OTHER): Payer: Self-pay | Admitting: *Deleted

## 2016-07-26 VITALS — BP 102/62 | HR 75 | Wt 119.9 lb

## 2016-07-26 DIAGNOSIS — O09219 Supervision of pregnancy with history of pre-term labor, unspecified trimester: Principal | ICD-10-CM

## 2016-07-26 DIAGNOSIS — O09212 Supervision of pregnancy with history of pre-term labor, second trimester: Secondary | ICD-10-CM

## 2016-07-26 DIAGNOSIS — O09899 Supervision of other high risk pregnancies, unspecified trimester: Secondary | ICD-10-CM

## 2016-07-26 NOTE — Progress Notes (Signed)
Used interprerter Lockie Mola.

## 2016-07-29 ENCOUNTER — Other Ambulatory Visit: Payer: Self-pay | Admitting: Obstetrics & Gynecology

## 2016-07-29 ENCOUNTER — Ambulatory Visit (HOSPITAL_COMMUNITY)
Admission: RE | Admit: 2016-07-29 | Discharge: 2016-07-29 | Disposition: A | Discharge status: Home / Self Care | Payer: Self-pay | Source: Ambulatory Visit | Attending: Obstetrics & Gynecology | Admitting: Obstetrics & Gynecology

## 2016-07-29 ENCOUNTER — Encounter (HOSPITAL_COMMUNITY): Payer: Self-pay

## 2016-07-29 DIAGNOSIS — O09212 Supervision of pregnancy with history of pre-term labor, second trimester: Secondary | ICD-10-CM

## 2016-07-29 DIAGNOSIS — O09892 Supervision of other high risk pregnancies, second trimester: Secondary | ICD-10-CM

## 2016-07-29 DIAGNOSIS — Z363 Encounter for antenatal screening for malformations: Secondary | ICD-10-CM | POA: Insufficient documentation

## 2016-07-29 DIAGNOSIS — O34211 Maternal care for low transverse scar from previous cesarean delivery: Secondary | ICD-10-CM | POA: Insufficient documentation

## 2016-07-29 DIAGNOSIS — Z3A18 18 weeks gestation of pregnancy: Secondary | ICD-10-CM

## 2016-07-29 DIAGNOSIS — O09292 Supervision of pregnancy with other poor reproductive or obstetric history, second trimester: Secondary | ICD-10-CM

## 2016-07-29 DIAGNOSIS — O34219 Maternal care for unspecified type scar from previous cesarean delivery: Secondary | ICD-10-CM

## 2016-07-29 DIAGNOSIS — O0992 Supervision of high risk pregnancy, unspecified, second trimester: Secondary | ICD-10-CM

## 2016-07-29 NOTE — ED Notes (Signed)
Video interpreter [phone], Explained to pt to arrive at 8am in the morning.  Check in at [facility], nothing to eat or drink after midnight tonight.  An adult is needed to accompany her for transportation home.  Pt voiced understanding.

## 2016-07-29 NOTE — Progress Notes (Signed)
[facility]N Attending Note   31 y.o. [phone] at [redacted]w[redacted]d with shortened cervix and funneling noted on scan by MFM today.  Please refer to ultrasound results for further details.  Cervical length 2.3 cm with funneling.  Ultrasound indicated cerclage placement recommended by Dr. Burnett Harry.  This was scheduled on 07/30/16 at 1000 with Dr. Roselie Awkward, patient advised to be NPO after midnight, report to hospital at 0800.  Dr. Roselie Awkward notified.  Verita Schneiders, MD, [place] Attending [place], [facility]

## 2016-07-30 ENCOUNTER — Encounter (HOSPITAL_COMMUNITY)
Admission: AD | Disposition: A | Discharge status: Home / Self Care | Payer: Self-pay | Source: Ambulatory Visit | Attending: Obstetrics & Gynecology

## 2016-07-30 ENCOUNTER — Ambulatory Visit (HOSPITAL_COMMUNITY): Payer: Self-pay | Admitting: Anesthesiology

## 2016-07-30 ENCOUNTER — Encounter (HOSPITAL_COMMUNITY): Payer: Self-pay | Admitting: *Deleted

## 2016-07-30 ENCOUNTER — Ambulatory Visit (HOSPITAL_COMMUNITY)
Admission: AD | Admit: 2016-07-30 | Discharge: 2016-07-30 | Disposition: A | Discharge status: Home / Self Care | Payer: Self-pay | Source: Ambulatory Visit | Attending: Obstetrics & Gynecology | Admitting: Obstetrics & Gynecology

## 2016-07-30 DIAGNOSIS — N883 Incompetence of cervix uteri: Secondary | ICD-10-CM

## 2016-07-30 DIAGNOSIS — O09219 Supervision of pregnancy with history of pre-term labor, unspecified trimester: Secondary | ICD-10-CM

## 2016-07-30 DIAGNOSIS — O3432 Maternal care for cervical incompetence, second trimester: Secondary | ICD-10-CM | POA: Insufficient documentation

## 2016-07-30 DIAGNOSIS — O0990 Supervision of high risk pregnancy, unspecified, unspecified trimester: Secondary | ICD-10-CM

## 2016-07-30 DIAGNOSIS — Z3A18 18 weeks gestation of pregnancy: Secondary | ICD-10-CM | POA: Insufficient documentation

## 2016-07-30 DIAGNOSIS — Z833 Family history of diabetes mellitus: Secondary | ICD-10-CM | POA: Insufficient documentation

## 2016-07-30 HISTORY — PX: CERVICAL CERCLAGE: SHX1329

## 2016-07-30 LAB — CBC
HEMATOCRIT: 35.5 % — AB (ref 36.0–46.0)
Hemoglobin: 12.4 g/dL (ref 12.0–15.0)
MCH: 33.5 pg (ref 26.0–34.0)
MCHC: 34.9 g/dL (ref 30.0–36.0)
MCV: 95.9 fL (ref 78.0–100.0)
PLATELETS: 226 10*3/uL (ref 150–400)
RBC: 3.7 MIL/uL — ABNORMAL LOW (ref 3.87–5.11)
RDW: 13.4 % (ref 11.5–15.5)
WBC: 5.7 10*3/uL (ref 4.0–10.5)

## 2016-07-30 SURGERY — CERCLAGE, CERVIX, VAGINAL APPROACH
Anesthesia: Spinal | Site: Vagina

## 2016-07-30 MED ORDER — OXYCODONE HCL 5 MG PO TABS: 5.0000 mg | ORAL_TABLET | ORAL | 0 refills | Status: DC | PRN

## 2016-07-30 MED ORDER — HYDROMORPHONE HCL 1 MG/ML IJ SOLN: 0.2500 mg | INTRAMUSCULAR | Status: DC | PRN

## 2016-07-30 MED ORDER — METOCLOPRAMIDE HCL 5 MG/ML IJ SOLN: 10.0000 mg | Freq: Once | INTRAMUSCULAR | Status: DC | PRN

## 2016-07-30 MED ORDER — OXYCODONE HCL 5 MG/5ML PO SOLN: 5.0000 mg | Freq: Once | ORAL | Status: DC | PRN

## 2016-07-30 MED ORDER — KETOROLAC TROMETHAMINE 30 MG/ML IJ SOLN: 30.0000 mg | Freq: Once | INTRAMUSCULAR | Status: DC | PRN

## 2016-07-30 MED ORDER — LACTATED RINGERS IV SOLN
INTRAVENOUS | Status: DC
  Administered 2016-07-30 (×2): via INTRAVENOUS

## 2016-07-30 MED ORDER — OXYCODONE HCL 5 MG PO TABS: 5.0000 mg | ORAL_TABLET | Freq: Once | ORAL | Status: DC | PRN

## 2016-07-30 SURGICAL SUPPLY — 18 items
CANISTER SUCT 3000ML PPV (MISCELLANEOUS) ×6 IMPLANT
CLOTH BEACON ORANGE TIMEOUT ST (SAFETY) ×3 IMPLANT
COUNTER NEEDLE 1200 MAGNETIC (NEEDLE) ×3 IMPLANT
GLOVE BIO SURGEON STRL SZ 6.5 (GLOVE) ×2 IMPLANT
GLOVE BIO SURGEONS STRL SZ 6.5 (GLOVE) ×1
GLOVE BIOGEL PI IND STRL 7.0 (GLOVE) ×2 IMPLANT
GLOVE BIOGEL PI INDICATOR 7.0 (GLOVE) ×4
GOWN STRL REUS W/TWL LRG LVL3 (GOWN DISPOSABLE) ×6 IMPLANT
NS IRRIG 1000ML POUR BTL (IV SOLUTION) ×3 IMPLANT
PACK VAGINAL MINOR WOMEN LF (CUSTOM PROCEDURE TRAY) ×3 IMPLANT
PAD OB MATERNITY 4.3X12.25 (PERSONAL CARE ITEMS) ×3 IMPLANT
PAD PREP 24X48 CUFFED NSTRL (MISCELLANEOUS) ×3 IMPLANT
SUT PROLENE 1 CT 1 30 (SUTURE) ×3 IMPLANT
TOWEL OR 17X24 6PK STRL BLUE (TOWEL DISPOSABLE) ×6 IMPLANT
TRAY FOLEY CATH SILVER 14FR (SET/KITS/TRAYS/PACK) ×3 IMPLANT
TUBING NON-CON 1/4 X 20 CONN (TUBING) ×2 IMPLANT
TUBING NON-CON 1/4 X 20' CONN (TUBING) ×1
YANKAUER SUCT BULB TIP NO VENT (SUCTIONS) ×3 IMPLANT

## 2016-07-30 NOTE — H&P (Signed)
Alyssa Wallace is an 31 y.o. female. [id number] Patient's last menstrual period was 03/22/2016. [redacted]w[redacted]d Scheduled for cerclage with short cervix seen on [place]   Menstrual History:  Patient's last menstrual period was 03/22/2016.    Past Medical History:  Diagnosis Date  . Preterm labor     Past Surgical History:  Procedure Laterality Date  . CESAREAN SECTION  04/05/2012   Procedure: CESAREAN SECTION;  Surgeon: Woodroe Mode, MD;  Location: [place];  Service: Obstetrics;  Laterality: N/A;  Primary Cesarean Section Delivery Girl @ [phone], Apgars 5/8    Family History  Problem Relation Age of Onset  . Diabetes Mother   . Diabetes Father   . Diabetes Sister     Social History:  reports that she has never smoked. She has never used smokeless tobacco. She reports that she does not drink alcohol or use drugs.  Allergies: No Known Allergies  Facility-Administered Medications Prior to Admission  Medication Dose Route Frequency Provider Last Rate Last Dose  . hydroxyprogesterone caproate (MAKENA) 250 mg/mL injection 250 mg  250 mg Intramuscular Weekly Aletha Halim, MD   250 mg at 07/26/16 7169   Prescriptions Prior to Admission  Medication Sig Dispense Refill Last Dose  . acetaminophen (TYLENOL) 325 MG tablet Take 650 mg by mouth every 6 (six) hours as needed.   Past Week at Unknown time  . Prenatal Vit-Fe Fumarate-FA (PRENATAL/FOLIC ACID) TABS Take 1 tablet by mouth daily. 30 each 11 07/29/2016 at 1500    Review of Systems  Constitutional: Negative.   Respiratory: Negative.   Gastrointestinal: Negative.   Genitourinary: Negative.     Blood pressure 91/61, pulse 67, temperature 97.8 F (36.6 C), temperature source Oral, resp. rate 18, height 5' (1.524 m), weight 122 lb (55.3 kg), last menstrual period 03/22/2016, SpO2 100 %, unknown if currently breastfeeding. Physical Exam  Vitals reviewed. Constitutional: She appears well-developed. No distress.  Cardiovascular: Normal  rate.   Respiratory: No respiratory distress.  Skin: Skin is warm and dry.  Psychiatric: She has a normal mood and affect.    Results for orders placed or performed during the hospital encounter of 07/30/16 (from the past 24 hour(s))  CBC     Status: Abnormal   Collection Time: 07/30/16  8:23 AM  Result Value Ref Range   WBC 5.7 4.0 - 10.5 K/uL   RBC 3.70 (L) 3.87 - 5.11 MIL/uL   Hemoglobin 12.4 12.0 - 15.0 g/dL   HCT 35.5 (L) 36.0 - 46.0 %   MCV 95.9 78.0 - 100.0 fL   MCH 33.5 26.0 - 34.0 pg   MCHC 34.9 30.0 - 36.0 g/dL   RDW 13.4 11.5 - 15.5 %   Platelets 226 150 - 400 K/uL    [place] Mfm Ob Transvaginal  Result Date: 07/30/2016 ----------------------------------------------------------------------  OBSTETRICS REPORT                       (Signed Final 07/30/2016 12:19 am) ---------------------------------------------------------------------- Patient Info  ID #:       [id number]                          D.O.B.:  02-Apr-1986 (30 yrs)  Name:       Alyssa Wallace                          Visit Date: 07/29/2016 09:26 am  [name] ---------------------------------------------------------------------- Performed By  Performed By:     Jacob Moores BS,       Ref. Address:      [place]                                                              [facility]                                                              [place], [place]  Attending:        Renella Cunas MD       Location:          [facility]  Referred By:      [facility] for                    [place] ---------------------------------------------------------------------- Orders   #  Description                                  Code   1  [place] MFM OB COMP + 14 WK                      76805.01   2  [place] MFM OB TRANSVAGINAL                      28315.1  ----------------------------------------------------------------------   #  Ordered By               Order #  Accession #    Episode #   1  Emeterio Reeve             [id number]      [id number]     [id number]   2  Emeterio Reeve             [id number]      [id number]     [id number]  ---------------------------------------------------------------------- Indications   [redacted] weeks gestation of pregnancy                Z3A.18   History of cesarean delivery, currently        O34.219   pregnant   Poor obstetric history: Previous preterm       O09.219   delivery (33 and 34 weeks), antepartum   Antenatal screening for malformations          Z36.3  ---------------------------------------------------------------------- OB History  Blood Type:            Height:  5'0"   Weight (lb):  121       BMI:  23.63  Gravidity:    4         Term:   0        Prem:   2         SAB:   1  TOP:          0       Ectopic:  0        Living: 2 ---------------------------------------------------------------------- Fetal Evaluation  Num Of Fetuses:     1  Fetal Heart         148  Rate(bpm):  Cardiac Activity:   Observed  Presentation:       Transverse, head to maternal right  Placenta:           Left lateral, above cervical os  P. Cord Insertion:  Visualized, central  Amniotic Fluid  AFI FV:      Subjectively within normal limits                              Largest Pocket(cm)                              5.5 ---------------------------------------------------------------------- Biometry  BPD:        43  mm     G. Age:  19w 0d         75  %    CI:         80.38  %    70 - 86                                                          FL/HC:       18.8  %    15.8 - 18  HC:      151.5  mm     G. Age:  18w 1d         31  %    HC/AC:       1.13       1.07 - 1.29  AC:      134.2  mm     G. Age:  18w 6d  62  %    FL/BPD:      66.3  %   FL:       28.5  mm     G. Age:  18w 5d         56  %    FL/AC:       21.2  %    20 - 24  HUM:      25.5  mm     G. Age:  18w 0d         41  %  CER:        19  mm     G. Age:  18w 4d         53  %  NFT:       3.7  mm  CM:        4.5  mm  Est. FW:     256   gm     0 lb 9 oz     52  % ---------------------------------------------------------------------- Gestational Age  LMP:           18w 3d        Date:  03/22/16                 EDD:    12/27/16  U/S Today:     18w 5d                                        EDD:    12/25/16  Best:          18w 3d     Det. By:  LMP  (03/22/16)          EDD:    12/27/16 ---------------------------------------------------------------------- Anatomy  Cranium:               Appears normal         Aortic Arch:            Appears normal  Cavum:                 Appears normal         Ductal Arch:            Appears normal  Ventricles:            Appears normal         Diaphragm:              Appears normal  Choroid Plexus:        Appears normal         Stomach:                Appears normal, left                                                                        sided  Cerebellum:            Appears normal         Abdomen:                Appears normal  Posterior Fossa:  Appears normal         Abdominal Wall:         Appears nml (cord                                                                        insert, abd wall)  Nuchal Fold:           Appears normal         Cord Vessels:           Appears normal (3                                                                        vessel cord)  Face:                  Appears normal         Kidneys:                Appear normal                         (orbits and profile)  Lips:                  Appears normal         Bladder:                Appears normal  Thoracic:              Appears normal         Spine:                  Not well visualized  Heart:                 Appears normal         Upper Extremities:      Appears normal                          (4CH, axis, and situs  RVOT:                  Appears normal         Lower Extremities:      Appears normal  LVOT:                  Appears normal  Other:  Fetus appears to be a female. Heels and 5th digit visualized. Nasal          bone visualized. Technically difficult due to fetal position. ---------------------------------------------------------------------- Cervix Uterus Adnexa  Cervix  Length:              2  cm.  Uterus  No abnormality visualized.  Left Ovary  Size(cm)     1.74   x   1.61   x  1.62      Vol(ml):  2.4  Within normal limits. No adnexal mass visualized.  Right Ovary  Size(cm)     2.97   x   1.16   x  1.07      Vol(ml):  1.9  Within normal limits. No adnexal mass visualized.  Cul De Sac:   No free fluid seen.  Adnexa:       No abnormality visualized. ---------------------------------------------------------------------- Impression  SIUP at 18+3 weeks  Normal detailed fetal anatomy; limited views of spine  Markers of aneuploidy: none  Normal amniotic fluid volume  Measurements consistent with LMP dating  EV views of cervix: funneling of internal os with distal closed  portion measuring 1.4 - 2.3 cms  The US findings were shared with Ms. Dara Lords.  The implications of a short cervix with a history of PPROM  and preterm delivery were discussed in detail. I reviewed the  risks and benefits of cerclage placement vs serial CLs and  she decided to have the surgery. Dr. Harolyn Rutherford scheduled the  procedure for tomorrow at 10:00 AM. ---------------------------------------------------------------------- Recommendations  Continue weekly 17P injections  CL in 2 weeks ----------------------------------------------------------------------                 Renella Cunas, MD Electronically Signed Final Report   07/30/2016 12:19 am ----------------------------------------------------------------------  [place] Mfm Ob Comp + 14 Wk  Result Date:  07/30/2016 ----------------------------------------------------------------------  OBSTETRICS REPORT                       (Signed Final 07/30/2016 12:19 am) ---------------------------------------------------------------------- Patient Info  ID #:       [id number]                          D.O.B.:  05/03/1986 (30 yrs)  Name:       Alyssa Wallace                          Visit Date: 07/29/2016 09:26 am              [place] ---------------------------------------------------------------------- Performed By  Performed By:     Jacob Moores BS,       Ref. Address:      [facility]                                                              [place]  [place], [place]  Attending:        Renella Cunas MD       Location:          [facility]  Referred By:      [facility] for                    [place] ---------------------------------------------------------------------- Orders   #  Description                                 Code   1  [place] MFM OB COMP + 14 WK                      76805.01   2  [place] MFM OB TRANSVAGINAL                      29924.2  ----------------------------------------------------------------------   #  Ordered By               Order #        Accession #    Episode #   1  Emeterio Reeve             [id number]      [id number]     [id number]   2  Emeterio Reeve             [id number]      [id number]     [id number]  ---------------------------------------------------------------------- Indications   [redacted] weeks gestation of pregnancy                Z3A.18   History of cesarean delivery, currently        O34.219   pregnant   Poor obstetric history: Previous  preterm       O09.219   delivery (33 and 34 weeks), antepartum   Antenatal screening for malformations          Z36.3  ---------------------------------------------------------------------- OB History  Blood Type:            Height:  5'0"   Weight (lb):  121       BMI:  23.63  Gravidity:    4         Term:   0        Prem:   2         SAB:   1  TOP:          0       Ectopic:  0        Living: 2 ---------------------------------------------------------------------- Fetal Evaluation  Num Of Fetuses:     1  Fetal Heart         148  Rate(bpm):  Cardiac Activity:   Observed  Presentation:       Transverse, head to maternal  right  Placenta:           Left lateral, above cervical os  P. Cord Insertion:  Visualized, central  Amniotic Fluid  AFI FV:      Subjectively within normal limits                              Largest Pocket(cm)                              5.5 ---------------------------------------------------------------------- Biometry  BPD:        43  mm     G. Age:  19w 0d         75  %    CI:         80.38  %    70 - 86                                                          FL/HC:       18.8  %    15.8 - 18  HC:      151.5  mm     G. Age:  18w 1d         31  %    HC/AC:       1.13       1.07 - 1.29  AC:      134.2  mm     G. Age:  18w 6d         62  %    FL/BPD:      66.3  %  FL:       28.5  mm     G. Age:  18w 5d         56  %    FL/AC:       21.2  %    20 - 24  HUM:      25.5  mm     G. Age:  18w 0d         41  %  CER:        19  mm     G. Age:  18w 4d         53  %  NFT:       3.7  mm  CM:        4.5  mm  Est. FW:     256   gm     0 lb 9 oz     52  % ---------------------------------------------------------------------- Gestational Age  LMP:           18w 3d        Date:  03/22/16                 EDD:    12/27/16  U/S Today:     18w 5d                                        EDD:    12/25/16  Best:          Renae Fickle 3d  Det. By:  LMP  (03/22/16)          EDD:    12/27/16  ---------------------------------------------------------------------- Anatomy  Cranium:               Appears normal         Aortic Arch:            Appears normal  Cavum:                 Appears normal         Ductal Arch:            Appears normal  Ventricles:            Appears normal         Diaphragm:              Appears normal  Choroid Plexus:        Appears normal         Stomach:                Appears normal, left                                                                        sided  Cerebellum:            Appears normal         Abdomen:                Appears normal  Posterior Fossa:       Appears normal         Abdominal Wall:         Appears nml (cord                                                                        insert, abd wall)  Nuchal Fold:           Appears normal         Cord Vessels:           Appears normal (3                                                                        vessel cord)  Face:                  Appears normal         Kidneys:                Appear normal                         (orbits and profile)  Lips:  Appears normal         Bladder:                Appears normal  Thoracic:              Appears normal         Spine:                  Not well visualized  Heart:                 Appears normal         Upper Extremities:      Appears normal                         (4CH, axis, and situs  RVOT:                  Appears normal         Lower Extremities:      Appears normal  LVOT:                  Appears normal  Other:  Fetus appears to be a female. Heels and 5th digit visualized. Nasal          bone visualized. Technically difficult due to fetal position. ---------------------------------------------------------------------- Cervix Uterus Adnexa  Cervix  Length:              2  cm.  Uterus  No abnormality visualized.  Left Ovary  Size(cm)     1.74   x   1.61   x  1.62      Vol(ml):  2.4  Within normal limits. No adnexal mass visualized.  Right  Ovary  Size(cm)     2.97   x   1.16   x  1.07      Vol(ml):  1.9  Within normal limits. No adnexal mass visualized.  Cul De Sac:   No free fluid seen.  Adnexa:       No abnormality visualized. ---------------------------------------------------------------------- Impression  SIUP at 18+3 weeks  Normal detailed fetal anatomy; limited views of spine  Markers of aneuploidy: none  Normal amniotic fluid volume  Measurements consistent with LMP dating  EV views of cervix: funneling of internal os with distal closed  portion measuring 1.4 - 2.3 cms  The US findings were shared with Ms. Dara Lords.  The implications of a short cervix with a history of PPROM  and preterm delivery were discussed in detail. I reviewed the  risks and benefits of cerclage placement vs serial CLs and  she decided to have the surgery. Dr. Harolyn Rutherford scheduled the  procedure for tomorrow at 10:00 AM. ---------------------------------------------------------------------- Recommendations  Continue weekly 17P injections  CL in 2 weeks ----------------------------------------------------------------------                 Renella Cunas, MD Electronically Signed Final Report   07/30/2016 12:19 am ----------------------------------------------------------------------   Assessment/Plan: [redacted]w[redacted]d [id number] Short cervix c/w insufficiency. She is scheduled for cerclage today.  [redacted]w[redacted]d with shortened cervix and funneling noted on scan by MFM today.  Please refer to ultrasound results for further details.  Cervical length 2.3 cm with funneling.  Ultrasound indicated cerclage placement recommended by Dr. Burnett Harry.  This was scheduled on 07/30/16 at 1000 with Dr. Roselie Awkward, patient advised to be NPO after midnight, report to hospital at 0800.  Dr. Roselie Awkward notified.  RISKS AND COMPLICATIONS   Infection.  Bleeding.  Rupturing the amniotic sac (membranes).  Going into early labor and delivery.  Problems with the anesthesia. Infection of the amniotic sac.  She was counseled with interpreter present and she agrees to proceed Emeterio Reeve 07/30/2016, 9:35 AM

## 2016-07-30 NOTE — Op Note (Signed)
Surgeon: Emeterio Reeve   Assistants: none. Anesthesia: Spinal  ASA Class: 1 Procedure: Cervical cerclage Preop diagnosis: Incompetent cervix at 18.[redacted] weeks gestation Postop diagnosis: Same Estimated blood loss: Less than 5 mL Complications: None Drains: None Counts: Correct  The patient gave written consent for cervical cerclage due to incompetent cervix at 18.[redacted] weeks gestation. Patient identification was confirmed and she was brought to the OR and spinal anesthesia was induced. She was placed in dorsal lithotomy position. Her perineum and vagina versus sterilely prepped and draped. The bladder was drained with the red rubber catheter. Exam revealed a long closed cervix.a graves speculum was placed. The cervix was visualized . Ring forceps were used to grasp the cervix and No 1 Proline was used.Four bites are taken circumferentially around the entire cervix in a purse-string fashion, each bite should be deep enough to extend at least midway into the cervical stroma, but not into the endocervical canal. The two ends of the suture were then tied securely at 6 o'clock anteriorly and cut, leaving the ends long enough to grasp with a clamp when it is time to remove it.  Good placement was seen with a suture about 3 cm back from the end of the cervix. All instruments were removed. There was minimal bleeding. Patient tolerated the procedure well without complications. She was brought in stable condition to the PACU.  Emeterio Reeve Md 07/30/2016 12:40 PM

## 2016-07-30 NOTE — Transfer of Care (Signed)
Immediate Anesthesia Transfer of Care Note  Patient: Alyssa Wallace  Procedure(s) Performed: Procedure(s): CERCLAGE CERVICAL (N/A)  Patient Location: PACU  Anesthesia Type:Spinal  Level of Consciousness: awake, alert , oriented and patient cooperative  Airway & Oxygen Therapy: Patient Spontanous Breathing  Post-op Assessment: Report given to RN and Post -op Vital signs reviewed and stable  Post vital signs: Reviewed and stable  Last Vitals:  Vitals:   07/30/16 0841 07/30/16 1115  BP: 91/61 (!) 89/56  Pulse: 67 70  Resp: 18 16  Temp: 36.6 C 36.9 C    Last Pain:  Vitals:   07/30/16 0841  TempSrc: Oral  PainSc: 0-No pain      Patients Stated Pain Goal: 3 (56/38/75 6433)  Complications: No apparent anesthesia complications

## 2016-07-30 NOTE — Discharge Instructions (Signed)
Cerclaje cervical (Cervical Cerclage) El cerclaje cervical es un procedimiento quirrgico para solucionar un cuello del tero incompetente. Un cuello del tero incompetente es un cuello dbil que se abre antes de que comience el trabajo de parto. El cerclaje cervical es un procedimiento en el que se sutura el cuello del tero para cerrarlo durante el embarazo.  INFORME A SU MDICO:   Cualquier alergia que tenga.  Todos los medicamentos que utiliza, incluidos vitaminas, hierbas, gotas oftlmicas, cremas y medicamentos de venta libre.  Problemas previos que usted o los miembros de su familia hayan tenido con el uso de anestsicos.  Enfermedades de la sangre.  Cirugas previas.  Afecciones mdicas que tenga.  Resfros o infecciones recientes. RIESGOS Y COMPLICACIONES  En general, se trata de un procedimiento seguro. Sin embargo, como en cualquier procedimiento, pueden surgir problemas. Estos son algunos posibles problemas:  Infeccin.  Hemorragias.  Ruptura del saco amnitico (membranas).  Comenzar el trabajo de parto y el parto de forma prematura.  Problemas con la anestesia.  Infeccin del saco amnitico. ANTES DEL PROCEDIMIENTO   Consulte a su mdico si debe cambiar o suspender sus medicamentos.  No coma ni beba nada durante las 6 a 8horas previas al procedimiento.  Pdale a alguna persona que la lleve a su casa luego del procedimiento. PROCEDIMIENTO   Se le colocar una sonda intravenosa en una de las venas. Le darn un sedante para que pueda relajarse.  Le aplicarn un medicamento que la har dormir durante el procedimiento (anestesia general) o le inyectarn un medicamento para adormecer la zona de la cintura para abajo (anestesia espinal o anestesia epidural). Usted dormir o tendr el cuerpo adormecido durante todo el procedimiento.  Le colocarn un espculo en la vagina para visualizar el cuello del tero.  Luego el cuello del tero se toma y se sutura de manera  apretada.  Podrn utilizar una ecografa para guiar el procedimiento y controlar al beb. DESPUS DEL PROCEDIMIENTO   La llevarn a una sala de recuperacin y controlarn al beb que an no ha nacido. Una vez que despierte, se encuentre estabilizada y pueda ingerir lquidos sin problemas, podr volver a su habitacin.  Deber permanecer en el hospital durante la noche.  Le aplicarn una inyeccin de progesterona para evitar las contracciones uterinas.  Le darn analgsicos para que tome en su casa.  Pdale a alguna persona que la lleve de vuelta a su casa y que permanezca con usted durante 2 das. Esta informacin no tiene como fin reemplazar el consejo del mdico. Asegrese de hacerle al mdico cualquier pregunta que tenga. Document Released: 07/23/2008 Document Revised: 05/01/2013 Elsevier Interactive Patient Education  2017 [employer]vier Inc.  

## 2016-07-30 NOTE — Anesthesia Preprocedure Evaluation (Signed)
Anesthesia Evaluation  Patient identified by MRN, date of birth, ID band Patient awake    Reviewed: Allergy & Precautions, H&P , NPO status , Patient's Chart, lab work & pertinent test results  Airway Mallampati: II       Dental no notable dental hx.    Pulmonary neg pulmonary ROS,    Pulmonary exam normal breath sounds clear to auscultation       Cardiovascular Exercise Tolerance: Good negative cardio ROS   Rhythm:regular Rate:Normal     Neuro/Psych negative neurological ROS  negative psych ROS   GI/Hepatic negative GI ROS, Neg liver ROS,   Endo/Other  negative endocrine ROS  Renal/GU negative Renal ROS  negative genitourinary   Musculoskeletal   Abdominal Normal abdominal exam  (+)   Peds  Hematology negative hematology ROS (+)   Anesthesia Other Findings   Reproductive/Obstetrics (+) Pregnancy                             Anesthesia Physical  Anesthesia Plan  ASA: II  Anesthesia Plan: Spinal   Post-op Pain Management:    Induction:   Airway Management Planned:   Additional Equipment:   Intra-op Plan:   Post-operative Plan:   Informed Consent: I have reviewed the patients History and Physical, chart, labs and discussed the procedure including the risks, benefits and alternatives for the proposed anesthesia with the patient or authorized representative who has indicated his/her understanding and acceptance.     Plan Discussed with: Anesthesiologist, CRNA and Surgeon  Anesthesia Plan Comments:         Anesthesia Quick Evaluation

## 2016-07-30 NOTE — Anesthesia Postprocedure Evaluation (Signed)
Anesthesia Post Note  Patient: Alyssa Wallace  Procedure(s) Performed: Procedure(s) (LRB): CERCLAGE CERVICAL (N/A)  Patient location during evaluation: PACU Anesthesia Type: Spinal Level of consciousness: oriented and awake and alert Pain management: pain level controlled Vital Signs Assessment: post-procedure vital signs reviewed and stable Respiratory status: spontaneous breathing and respiratory function stable Cardiovascular status: blood pressure returned to baseline and stable Postop Assessment: no headache and no backache Anesthetic complications: no        Last Vitals:  Vitals:   07/30/16 1245 07/30/16 1300  BP: 95/67 107/64  Pulse: 71 68  Resp: 14 20  Temp:      Last Pain:  Vitals:   07/30/16 0841  TempSrc: Oral  PainSc: 0-No pain   Pain Goal: Patients Stated Pain Goal: 3 (07/30/16 1245)               Lynda Rainwater

## 2016-08-02 ENCOUNTER — Ambulatory Visit (INDEPENDENT_AMBULATORY_CARE_PROVIDER_SITE_OTHER): Payer: Self-pay | Admitting: *Deleted

## 2016-08-02 VITALS — BP 103/60 | HR 73

## 2016-08-02 DIAGNOSIS — O09212 Supervision of pregnancy with history of pre-term labor, second trimester: Secondary | ICD-10-CM

## 2016-08-02 DIAGNOSIS — O09219 Supervision of pregnancy with history of pre-term labor, unspecified trimester: Principal | ICD-10-CM

## 2016-08-02 DIAGNOSIS — O09899 Supervision of other high risk pregnancies, unspecified trimester: Secondary | ICD-10-CM

## 2016-08-03 ENCOUNTER — Encounter (HOSPITAL_COMMUNITY): Payer: Self-pay | Admitting: Obstetrics & Gynecology

## 2016-08-03 NOTE — Addendum Note (Signed)
Addendum  created 08/03/16 1042 by Asher Muir, CRNA   Charge Capture section accepted

## 2016-08-09 ENCOUNTER — Ambulatory Visit (INDEPENDENT_AMBULATORY_CARE_PROVIDER_SITE_OTHER): Payer: Self-pay | Admitting: Obstetrics & Gynecology

## 2016-08-09 VITALS — BP 94/62 | HR 89 | Wt 120.9 lb

## 2016-08-09 DIAGNOSIS — O09292 Supervision of pregnancy with other poor reproductive or obstetric history, second trimester: Secondary | ICD-10-CM

## 2016-08-09 DIAGNOSIS — O09212 Supervision of pregnancy with history of pre-term labor, second trimester: Secondary | ICD-10-CM

## 2016-08-09 DIAGNOSIS — Z113 Encounter for screening for infections with a predominantly sexual mode of transmission: Secondary | ICD-10-CM

## 2016-08-09 DIAGNOSIS — O26899 Other specified pregnancy related conditions, unspecified trimester: Secondary | ICD-10-CM

## 2016-08-09 DIAGNOSIS — O0992 Supervision of high risk pregnancy, unspecified, second trimester: Secondary | ICD-10-CM

## 2016-08-09 DIAGNOSIS — N898 Other specified noninflammatory disorders of vagina: Secondary | ICD-10-CM

## 2016-08-09 LAB — POCT URINALYSIS DIP (DEVICE)
BILIRUBIN URINE: NEGATIVE
Glucose, UA: NEGATIVE mg/dL
HGB URINE DIPSTICK: NEGATIVE
KETONES UR: NEGATIVE mg/dL
LEUKOCYTES UA: NEGATIVE
NITRITE: NEGATIVE
Protein, ur: NEGATIVE mg/dL
Specific Gravity, Urine: 1.02 (ref 1.005–1.030)
Urobilinogen, UA: 0.2 mg/dL (ref 0.0–1.0)
pH: 7 (ref 5.0–8.0)

## 2016-08-09 NOTE — Progress Notes (Signed)
Spanish Interpreter Sherolyn Buba  Pt c/o white discharge with itchiness  OB f/u US scheduled for April 10th @ 1030.  Pt notified.      PRENATAL VISIT NOTE  Subjective:  Alyssa Wallace is a 31 y.o. [id number] at [redacted]w[redacted]d being seen today for ongoing prenatal care.  She is currently monitored for the following issues for this high-risk pregnancy and has Supervision of high-risk pregnancy; Previous cesarean section complicating pregnancy; History of preterm premature rupture of membranes (PROM) in previous pregnancy, currently pregnant; and Language barrier on her problem list.  Patient reports vaginal irritation and itching.  Pt has no vaginal pressure or vaginal bleeding..  Contractions: Not present. Vag. Bleeding: None.  Movement: Present. Denies leaking of fluid.   The following portions of the patient's history were reviewed and updated as appropriate: allergies, current medications, past family history, past medical history, past social history, past surgical history and problem list. Problem list updated.  Objective:   Vitals:   08/09/16 0805  BP: 94/62  Pulse: 89  Weight: 120 lb 14.4 oz (54.8 kg)    Fetal Status: Fetal Heart Rate (bpm): 147   Movement: Present     General:  Alert, oriented and cooperative. Patient is in no acute distress.  Skin: Skin is warm and dry. No rash noted.   Cardiovascular: Normal heart rate noted  Respiratory: Normal respiratory effort, no problems with respiration noted  Abdomen: Soft, gravid, appropriate for gestational age. Pain/Pressure: Present     Pelvic:  Cervical exam performed      cl; no tension on stitch, cervix short and soft.  Extremities: Normal range of motion.  Edema: None  Mental Status: Normal mood and affect. Normal behavior. Normal judgment and thought content.   Assessment and Plan:  Pregnancy: [id number] at [redacted]w[redacted]d  1. Vaginal discharge during pregnancy, antepartum - Cervicovaginal ancillary only  2. Supervision of high risk  pregnancy in second trimester -cervical length - [place] MFM OB FOLLOW UP; Future  3. History of preterm premature rupture of membranes (PROM) in previous pregnancy, currently pregnant in second trimester Cerclage in situ.  Preterm labor symptoms and general obstetric precautions including but not limited to vaginal bleeding, contractions, leaking of fluid and fetal movement were reviewed in detail with the patient. Please refer to After Visit Summary for other counseling recommendations.  Return in about 2 weeks (around 08/23/2016).   Guss Bunde, MD

## 2016-08-10 LAB — CERVICOVAGINAL ANCILLARY ONLY
Bacterial vaginitis: NEGATIVE
Candida vaginitis: POSITIVE — AB
Chlamydia: NEGATIVE
Neisseria Gonorrhea: NEGATIVE
Trichomonas: NEGATIVE

## 2016-08-16 ENCOUNTER — Ambulatory Visit: Payer: Self-pay

## 2016-08-17 ENCOUNTER — Ambulatory Visit (INDEPENDENT_AMBULATORY_CARE_PROVIDER_SITE_OTHER): Payer: Self-pay

## 2016-08-17 ENCOUNTER — Encounter (HOSPITAL_COMMUNITY): Payer: Self-pay

## 2016-08-17 ENCOUNTER — Other Ambulatory Visit: Payer: Self-pay | Admitting: Obstetrics & Gynecology

## 2016-08-17 ENCOUNTER — Ambulatory Visit (HOSPITAL_COMMUNITY)
Admission: RE | Admit: 2016-08-17 | Discharge: 2016-08-17 | Disposition: A | Discharge status: Home / Self Care | Payer: Self-pay | Source: Ambulatory Visit | Attending: Obstetrics & Gynecology | Admitting: Obstetrics & Gynecology

## 2016-08-17 ENCOUNTER — Other Ambulatory Visit (HOSPITAL_COMMUNITY): Payer: Self-pay | Admitting: *Deleted

## 2016-08-17 VITALS — BP 99/59 | HR 79

## 2016-08-17 DIAGNOSIS — O34219 Maternal care for unspecified type scar from previous cesarean delivery: Secondary | ICD-10-CM | POA: Insufficient documentation

## 2016-08-17 DIAGNOSIS — Z3686 Encounter for antenatal screening for cervical length: Secondary | ICD-10-CM

## 2016-08-17 DIAGNOSIS — O0991 Supervision of high risk pregnancy, unspecified, first trimester: Secondary | ICD-10-CM

## 2016-08-17 DIAGNOSIS — Z3A21 21 weeks gestation of pregnancy: Secondary | ICD-10-CM | POA: Insufficient documentation

## 2016-08-17 DIAGNOSIS — O09212 Supervision of pregnancy with history of pre-term labor, second trimester: Secondary | ICD-10-CM

## 2016-08-17 DIAGNOSIS — O3432 Maternal care for cervical incompetence, second trimester: Secondary | ICD-10-CM

## 2016-08-17 DIAGNOSIS — O0992 Supervision of high risk pregnancy, unspecified, second trimester: Secondary | ICD-10-CM

## 2016-08-17 DIAGNOSIS — Z363 Encounter for antenatal screening for malformations: Secondary | ICD-10-CM | POA: Insufficient documentation

## 2016-08-17 MED ORDER — HYDROXYPROGESTERONE CAPROATE 250 MG/ML IM OIL: 250.0000 mg | TOPICAL_OIL | Freq: Once | INTRAMUSCULAR | Status: DC

## 2016-08-17 NOTE — Progress Notes (Signed)
Patient received 17p injection today. Patient tolerated well. Vitals were obtain

## 2016-08-17 NOTE — Addendum Note (Signed)
Addended by: Phillip Heal, Caryl Manas A on: 08/17/2016 10:24 AM   Modules accepted: Orders

## 2016-08-23 ENCOUNTER — Ambulatory Visit (INDEPENDENT_AMBULATORY_CARE_PROVIDER_SITE_OTHER): Payer: Self-pay | Admitting: Family Medicine

## 2016-08-23 VITALS — BP 98/64 | HR 79 | Wt 125.2 lb

## 2016-08-23 DIAGNOSIS — O0992 Supervision of high risk pregnancy, unspecified, second trimester: Secondary | ICD-10-CM

## 2016-08-23 DIAGNOSIS — O09292 Supervision of pregnancy with other poor reproductive or obstetric history, second trimester: Secondary | ICD-10-CM

## 2016-08-23 DIAGNOSIS — O09212 Supervision of pregnancy with history of pre-term labor, second trimester: Secondary | ICD-10-CM

## 2016-08-23 NOTE — Progress Notes (Signed)
   PRENATAL VISIT NOTE  Subjective:  Alyssa Wallace is a 31 y.o. [phone] at [redacted]w[redacted]d being seen today for ongoing prenatal care.  She is currently monitored for the following issues for this high-risk pregnancy and has Supervision of high-risk pregnancy; Previous cesarean section complicating pregnancy; History of preterm premature rupture of membranes (PROM) in previous pregnancy, currently pregnant; and Language barrier on her problem list.  Patient reports no complaints.  Contractions: Not present. Vag. Bleeding: None.  Movement: Present. Denies leaking of fluid.   The following portions of the patient's history were reviewed and updated as appropriate: allergies, current medications, past family history, past medical history, past social history, past surgical history and problem list. Problem list updated.  Objective:   Vitals:   08/23/16 1243  BP: 98/64  Pulse: 79  Weight: 125 lb 3.2 oz (56.8 kg)    Fetal Status: Fetal Heart Rate (bpm): 144   Movement: Present     General:  Alert, oriented and cooperative. Patient is in no acute distress.  Skin: Skin is warm and dry. No rash noted.   Cardiovascular: Normal heart rate noted  Respiratory: Normal respiratory effort, no problems with respiration noted  Abdomen: Soft, gravid, appropriate for gestational age. Pain/Pressure: Absent     Pelvic:  Cervical exam deferred        Extremities: Normal range of motion.  Edema: None  Mental Status: Normal mood and affect. Normal behavior. Normal judgment and thought content.   Assessment and Plan:  Pregnancy: [id number] at [redacted]w[redacted]d  1. Supervision of high risk pregnancy in second trimester FHT and FH normal  2. History of preterm premature rupture of membranes (PROM) in previous pregnancy, currently pregnant in second trimester Cerclage in place. Continue makena and cervical length.  Preterm labor symptoms and general obstetric precautions including but not limited to vaginal bleeding,  contractions, leaking of fluid and fetal movement were reviewed in detail with the patient. Please refer to After Visit Summary for other counseling recommendations.  Return in about 4 weeks (around 09/20/2016) for OB f/u.   Truett Mainland, DO

## 2016-08-30 ENCOUNTER — Ambulatory Visit (INDEPENDENT_AMBULATORY_CARE_PROVIDER_SITE_OTHER): Payer: Self-pay | Admitting: *Deleted

## 2016-08-30 VITALS — BP 105/64 | HR 78

## 2016-08-30 DIAGNOSIS — O09212 Supervision of pregnancy with history of pre-term labor, second trimester: Secondary | ICD-10-CM

## 2016-08-30 DIAGNOSIS — Z8751 Personal history of pre-term labor: Secondary | ICD-10-CM

## 2016-08-30 NOTE — Progress Notes (Addendum)
Pt complaining of seasonal allergies advised that she pick up Claritin at her pharmacy. Called and requested refill on makena. It is scheduled to be delivered tomorrow.

## 2016-08-31 ENCOUNTER — Ambulatory Visit (HOSPITAL_COMMUNITY)
Admission: RE | Admit: 2016-08-31 | Discharge: 2016-08-31 | Disposition: A | Discharge status: Home / Self Care | Payer: Self-pay | Source: Ambulatory Visit | Attending: Obstetrics & Gynecology | Admitting: Obstetrics & Gynecology

## 2016-08-31 ENCOUNTER — Encounter (HOSPITAL_COMMUNITY): Payer: Self-pay

## 2016-08-31 ENCOUNTER — Other Ambulatory Visit (HOSPITAL_COMMUNITY): Payer: Self-pay | Admitting: Obstetrics and Gynecology

## 2016-08-31 DIAGNOSIS — Z3A23 23 weeks gestation of pregnancy: Secondary | ICD-10-CM

## 2016-08-31 DIAGNOSIS — O34219 Maternal care for unspecified type scar from previous cesarean delivery: Secondary | ICD-10-CM | POA: Insufficient documentation

## 2016-08-31 DIAGNOSIS — Z3686 Encounter for antenatal screening for cervical length: Secondary | ICD-10-CM | POA: Insufficient documentation

## 2016-08-31 DIAGNOSIS — O09213 Supervision of pregnancy with history of pre-term labor, third trimester: Secondary | ICD-10-CM | POA: Insufficient documentation

## 2016-09-06 ENCOUNTER — Ambulatory Visit (INDEPENDENT_AMBULATORY_CARE_PROVIDER_SITE_OTHER): Payer: Self-pay | Admitting: *Deleted

## 2016-09-06 DIAGNOSIS — O09212 Supervision of pregnancy with history of pre-term labor, second trimester: Secondary | ICD-10-CM

## 2016-09-06 DIAGNOSIS — Z8751 Personal history of pre-term labor: Secondary | ICD-10-CM

## 2016-09-13 ENCOUNTER — Ambulatory Visit (INDEPENDENT_AMBULATORY_CARE_PROVIDER_SITE_OTHER): Payer: Self-pay | Admitting: *Deleted

## 2016-09-13 VITALS — BP 98/65 | HR 82 | Wt 127.3 lb

## 2016-09-13 DIAGNOSIS — O09212 Supervision of pregnancy with history of pre-term labor, second trimester: Secondary | ICD-10-CM

## 2016-09-13 DIAGNOSIS — Z8751 Personal history of pre-term labor: Secondary | ICD-10-CM

## 2016-09-13 NOTE — Progress Notes (Signed)
Interpreter Lockie Mola used for encounter today.  Makena 250 mg administered as scheduled.  Pt tolerated well.

## 2016-09-14 ENCOUNTER — Encounter (HOSPITAL_COMMUNITY): Payer: Self-pay

## 2016-09-14 ENCOUNTER — Ambulatory Visit (HOSPITAL_COMMUNITY)
Admission: RE | Admit: 2016-09-14 | Discharge: 2016-09-14 | Disposition: A | Discharge status: Home / Self Care | Payer: Self-pay | Source: Ambulatory Visit | Attending: Obstetrics & Gynecology | Admitting: Obstetrics & Gynecology

## 2016-09-14 DIAGNOSIS — Z3A25 25 weeks gestation of pregnancy: Secondary | ICD-10-CM | POA: Insufficient documentation

## 2016-09-14 DIAGNOSIS — Z3686 Encounter for antenatal screening for cervical length: Secondary | ICD-10-CM | POA: Insufficient documentation

## 2016-09-14 DIAGNOSIS — O34219 Maternal care for unspecified type scar from previous cesarean delivery: Secondary | ICD-10-CM | POA: Insufficient documentation

## 2016-09-14 DIAGNOSIS — Z8751 Personal history of pre-term labor: Secondary | ICD-10-CM | POA: Insufficient documentation

## 2016-09-20 ENCOUNTER — Ambulatory Visit (INDEPENDENT_AMBULATORY_CARE_PROVIDER_SITE_OTHER): Payer: Self-pay | Admitting: Family Medicine

## 2016-09-20 VITALS — BP 99/62 | HR 77 | Wt 126.2 lb

## 2016-09-20 DIAGNOSIS — O34219 Maternal care for unspecified type scar from previous cesarean delivery: Secondary | ICD-10-CM

## 2016-09-20 DIAGNOSIS — O09292 Supervision of pregnancy with other poor reproductive or obstetric history, second trimester: Secondary | ICD-10-CM

## 2016-09-20 DIAGNOSIS — O09212 Supervision of pregnancy with history of pre-term labor, second trimester: Secondary | ICD-10-CM

## 2016-09-20 DIAGNOSIS — O4442 Low lying placenta NOS or without hemorrhage, second trimester: Secondary | ICD-10-CM

## 2016-09-20 DIAGNOSIS — O4440 Low lying placenta NOS or without hemorrhage, unspecified trimester: Secondary | ICD-10-CM

## 2016-09-20 DIAGNOSIS — O0992 Supervision of high risk pregnancy, unspecified, second trimester: Secondary | ICD-10-CM

## 2016-09-20 NOTE — Progress Notes (Signed)
Spanish interpreter: Earnest Bailey used   PRENATAL VISIT NOTE  Subjective:  Alyssa Wallace is a 31 y.o. [phone] at [redacted]w[redacted]d being seen today for ongoing prenatal care.  She is currently monitored for the following issues for this high-risk pregnancy and has Supervision of high-risk pregnancy; Previous cesarean section complicating pregnancy; History of preterm premature rupture of membranes (PROM) in previous pregnancy, currently pregnant; Language barrier; and Low lying placenta, antepartum on her problem list.  Patient reports no complaints.  Contractions: Not present. Vag. Bleeding: None.  Movement: Present. Denies leaking of fluid.   The following portions of the patient's history were reviewed and updated as appropriate: allergies, current medications, past family history, past medical history, past social history, past surgical history and problem list. Problem list updated.  Objective:   Vitals:   09/20/16 0949  BP: 99/62  Pulse: 77  Weight: 126 lb 3.2 oz (57.2 kg)    Fetal Status: Fetal Heart Rate (bpm): 134   Movement: Present     General:  Alert, oriented and cooperative. Patient is in no acute distress.  Skin: Skin is warm and dry. No rash noted.   Cardiovascular: Normal heart rate noted  Respiratory: Normal respiratory effort, no problems with respiration noted  Abdomen: Soft, gravid, appropriate for gestational age. Pain/Pressure: Absent     Pelvic:  Cervical exam deferred        Extremities: Normal range of motion.  Edema: None  Mental Status: Normal mood and affect. Normal behavior. Normal judgment and thought content.   Assessment and Plan:  Pregnancy: [id number] at [redacted]w[redacted]d  1. Supervision of high risk pregnancy in second trimester Continue prenatal care.   2. Previous cesarean section complicating pregnancy Desires VBAC, consent reviewed and signed today  3. History of preterm premature rupture of membranes (PROM) in previous pregnancy, currently pregnant in  second trimester Continue cervical cerclage and 17 P  4. Low lying placenta, antepartum Recheck in third trimester  Preterm labor symptoms and general obstetric precautions including but not limited to vaginal bleeding, contractions, leaking of fluid and fetal movement were reviewed in detail with the patient. Please refer to After Visit Summary for other counseling recommendations.  Return in 2 weeks (on 10/04/2016) for 28 wk labs, [place].   Donnamae Jude, MD

## 2016-09-20 NOTE — Patient Instructions (Signed)

## 2016-09-21 ENCOUNTER — Encounter: Payer: Self-pay | Admitting: *Deleted

## 2016-09-27 ENCOUNTER — Ambulatory Visit (INDEPENDENT_AMBULATORY_CARE_PROVIDER_SITE_OTHER): Payer: Self-pay | Admitting: *Deleted

## 2016-09-27 VITALS — BP 108/58 | HR 83

## 2016-09-27 DIAGNOSIS — O09212 Supervision of pregnancy with history of pre-term labor, second trimester: Secondary | ICD-10-CM

## 2016-09-27 DIAGNOSIS — O0992 Supervision of high risk pregnancy, unspecified, second trimester: Secondary | ICD-10-CM

## 2016-09-27 DIAGNOSIS — O09292 Supervision of pregnancy with other poor reproductive or obstetric history, second trimester: Secondary | ICD-10-CM

## 2016-09-28 ENCOUNTER — Other Ambulatory Visit (HOSPITAL_COMMUNITY): Payer: Self-pay | Admitting: *Deleted

## 2016-09-28 ENCOUNTER — Ambulatory Visit (HOSPITAL_COMMUNITY)
Admission: RE | Admit: 2016-09-28 | Discharge: 2016-09-28 | Disposition: A | Discharge status: Home / Self Care | Payer: Self-pay | Source: Ambulatory Visit | Attending: Obstetrics & Gynecology | Admitting: Obstetrics & Gynecology

## 2016-09-28 ENCOUNTER — Other Ambulatory Visit (HOSPITAL_COMMUNITY): Payer: Self-pay | Admitting: Obstetrics and Gynecology

## 2016-09-28 ENCOUNTER — Encounter (HOSPITAL_COMMUNITY): Payer: Self-pay

## 2016-09-28 DIAGNOSIS — Z3686 Encounter for antenatal screening for cervical length: Secondary | ICD-10-CM

## 2016-09-28 DIAGNOSIS — Z3A27 27 weeks gestation of pregnancy: Secondary | ICD-10-CM | POA: Insufficient documentation

## 2016-09-28 DIAGNOSIS — O3432 Maternal care for cervical incompetence, second trimester: Secondary | ICD-10-CM

## 2016-09-28 DIAGNOSIS — O34219 Maternal care for unspecified type scar from previous cesarean delivery: Secondary | ICD-10-CM | POA: Insufficient documentation

## 2016-09-28 DIAGNOSIS — O4440 Low lying placenta NOS or without hemorrhage, unspecified trimester: Secondary | ICD-10-CM

## 2016-09-28 DIAGNOSIS — O09219 Supervision of pregnancy with history of pre-term labor, unspecified trimester: Secondary | ICD-10-CM | POA: Insufficient documentation

## 2016-09-28 NOTE — Addendum Note (Signed)
Encounter addended by: Abigail Butts on: 09/28/2016 12:54 PM<BR>    Actions taken: Imaging Exam ended

## 2016-10-05 ENCOUNTER — Ambulatory Visit (INDEPENDENT_AMBULATORY_CARE_PROVIDER_SITE_OTHER): Payer: Self-pay | Admitting: General Practice

## 2016-10-05 DIAGNOSIS — O09293 Supervision of pregnancy with other poor reproductive or obstetric history, third trimester: Secondary | ICD-10-CM

## 2016-10-08 ENCOUNTER — Ambulatory Visit (INDEPENDENT_AMBULATORY_CARE_PROVIDER_SITE_OTHER): Payer: Self-pay | Admitting: Family Medicine

## 2016-10-08 VITALS — BP 101/54 | HR 75 | Wt 129.7 lb

## 2016-10-08 DIAGNOSIS — Z789 Other specified health status: Secondary | ICD-10-CM

## 2016-10-08 DIAGNOSIS — B3731 Acute candidiasis of vulva and vagina: Secondary | ICD-10-CM

## 2016-10-08 DIAGNOSIS — Z23 Encounter for immunization: Secondary | ICD-10-CM

## 2016-10-08 DIAGNOSIS — O34219 Maternal care for unspecified type scar from previous cesarean delivery: Secondary | ICD-10-CM

## 2016-10-08 DIAGNOSIS — B373 Candidiasis of vulva and vagina: Secondary | ICD-10-CM

## 2016-10-08 DIAGNOSIS — O0993 Supervision of high risk pregnancy, unspecified, third trimester: Secondary | ICD-10-CM

## 2016-10-08 DIAGNOSIS — O09293 Supervision of pregnancy with other poor reproductive or obstetric history, third trimester: Secondary | ICD-10-CM

## 2016-10-08 MED ORDER — CLOTRIMAZOLE 1 % VA CREA: 1.0000 | TOPICAL_CREAM | Freq: Two times a day (BID) | VAGINAL | 2 refills | Status: DC

## 2016-10-08 NOTE — Progress Notes (Signed)
   PRENATAL VISIT NOTE  Subjective:  Alyssa Wallace is a 31 y.o. [phone] at [redacted]w[redacted]d being seen today for ongoing prenatal care.  She is currently monitored for the following issues for this high-risk pregnancy and has Supervision of high-risk pregnancy; Previous cesarean section complicating pregnancy; History of preterm premature rupture of membranes (PROM) in previous pregnancy, currently pregnant; Language barrier; and Low lying placenta, antepartum on her problem list.  Patient reports vaginal itching that started a week ago and has been worsening. No discharge. On outside. No paliatting or provoking factors..  Contractions: Not present. Vag. Bleeding: None.  Movement: Present. Denies leaking of fluid.   The following portions of the patient's history were reviewed and updated as appropriate: allergies, current medications, past family history, past medical history, past social history, past surgical history and problem list. Problem list updated.  Objective:   Vitals:   10/08/16 0813  BP: (!) 101/54  Pulse: 75  Weight: 129 lb 11.2 oz (58.8 kg)    Fetal Status: Fetal Heart Rate (bpm): 135   Movement: Present     General:  Alert, oriented and cooperative. Patient is in no acute distress.  Skin: Skin is warm and dry. No rash noted.   Cardiovascular: Normal heart rate noted  Respiratory: Normal respiratory effort, no problems with respiration noted  Abdomen: Soft, gravid, appropriate for gestational age. Pain/Pressure: Absent     Pelvic:  Cervical exam deferred      Erythema of external genitalia  Extremities: Normal range of motion.  Edema: None  Mental Status: Normal mood and affect. Normal behavior. Normal judgment and thought content.   Assessment and Plan:  Pregnancy: [id number] at [redacted]w[redacted]d  1. Supervision of high risk pregnancy in third trimester FHT and FH normal. 28 week labs. - Glucose Tolerance, 2 Hours w/1 Hour - CBC - HIV antibody (with reflex) - RPR  2. Previous  cesarean section complicating pregnancy  3. Language barrier Interpreter used  4. History of preterm premature rupture of membranes (PROM) in previous pregnancy, currently pregnant in third trimester Cerclage in place. Continue 17-OH p  5. Need for Tdap vaccination - Tdap vaccine greater than or equal to 7yo IM  6. Vulvovaginitis due to yeast Clotrimazole prescribed.  Preterm labor symptoms and general obstetric precautions including but not limited to vaginal bleeding, contractions, leaking of fluid and fetal movement were reviewed in detail with the patient. Please refer to After Visit Summary for other counseling recommendations.  No Follow-up on file.   Truett Mainland, DO

## 2016-10-08 NOTE — Progress Notes (Signed)
Spanish video interpreter "Hassan Rowan" [phone] used

## 2016-10-09 ENCOUNTER — Inpatient Hospital Stay (HOSPITAL_COMMUNITY)
Admission: AD | Admit: 2016-10-09 | Discharge: 2016-10-16 | DRG: 782 | Disposition: A | Discharge status: Home / Self Care | Payer: Medicaid Other | Source: Ambulatory Visit | Attending: Obstetrics & Gynecology | Admitting: Obstetrics & Gynecology

## 2016-10-09 ENCOUNTER — Inpatient Hospital Stay (HOSPITAL_COMMUNITY): Payer: Medicaid Other

## 2016-10-09 ENCOUNTER — Inpatient Hospital Stay (HOSPITAL_COMMUNITY): Payer: Medicaid Other | Admitting: Anesthesiology

## 2016-10-09 ENCOUNTER — Encounter (HOSPITAL_COMMUNITY): Payer: Self-pay

## 2016-10-09 DIAGNOSIS — O34211 Maternal care for low transverse scar from previous cesarean delivery: Secondary | ICD-10-CM | POA: Diagnosis present

## 2016-10-09 DIAGNOSIS — Z3A28 28 weeks gestation of pregnancy: Secondary | ICD-10-CM

## 2016-10-09 DIAGNOSIS — O4453 Low lying placenta with hemorrhage, third trimester: Principal | ICD-10-CM | POA: Diagnosis present

## 2016-10-09 DIAGNOSIS — O3433 Maternal care for cervical incompetence, third trimester: Secondary | ICD-10-CM

## 2016-10-09 DIAGNOSIS — O09293 Supervision of pregnancy with other poor reproductive or obstetric history, third trimester: Secondary | ICD-10-CM

## 2016-10-09 DIAGNOSIS — O4440 Low lying placenta NOS or without hemorrhage, unspecified trimester: Secondary | ICD-10-CM

## 2016-10-09 DIAGNOSIS — Z789 Other specified health status: Secondary | ICD-10-CM

## 2016-10-09 DIAGNOSIS — O4693 Antepartum hemorrhage, unspecified, third trimester: Secondary | ICD-10-CM | POA: Diagnosis present

## 2016-10-09 DIAGNOSIS — O4690 Antepartum hemorrhage, unspecified, unspecified trimester: Secondary | ICD-10-CM

## 2016-10-09 DIAGNOSIS — O4442 Low lying placenta NOS or without hemorrhage, second trimester: Secondary | ICD-10-CM | POA: Diagnosis present

## 2016-10-09 LAB — URINALYSIS, ROUTINE W REFLEX MICROSCOPIC
BACTERIA UA: NONE SEEN
Bilirubin Urine: NEGATIVE
GLUCOSE, UA: NEGATIVE mg/dL
KETONES UR: NEGATIVE mg/dL
NITRITE: NEGATIVE
PH: 7 (ref 5.0–8.0)
Protein, ur: NEGATIVE mg/dL
RBC / HPF: NONE SEEN RBC/hpf (ref 0–5)
SPECIFIC GRAVITY, URINE: 1.002 — AB (ref 1.005–1.030)
SQUAMOUS EPITHELIAL / LPF: NONE SEEN
WBC, UA: NONE SEEN WBC/hpf (ref 0–5)

## 2016-10-09 LAB — CBC
HEMATOCRIT: 37.7 % (ref 34.0–46.6)
HEMATOCRIT: 37.3 % (ref 36.0–46.0)
HEMOGLOBIN: 12.8 g/dL (ref 11.1–15.9)
HEMOGLOBIN: 13.1 g/dL (ref 12.0–15.0)
MCH: 33.3 pg — ABNORMAL HIGH (ref 26.6–33.0)
MCH: 33.7 pg (ref 26.0–34.0)
MCHC: 34 g/dL (ref 31.5–35.7)
MCHC: 35.1 g/dL (ref 30.0–36.0)
MCV: 98 fL — ABNORMAL HIGH (ref 79–97)
MCV: 95.9 fL (ref 78.0–100.0)
Platelets: 208 10*3/uL (ref 150–379)
Platelets: 231 10*3/uL (ref 150–400)
RBC: 3.84 x10E6/uL (ref 3.77–5.28)
RBC: 3.89 MIL/uL (ref 3.87–5.11)
RDW: 13.9 % (ref 12.3–15.4)
RDW: 13.6 % (ref 11.5–15.5)
WBC: 6.4 10*3/uL (ref 3.4–10.8)
WBC: 6.6 10*3/uL (ref 4.0–10.5)

## 2016-10-09 LAB — GLUCOSE TOLERANCE, 2 HOURS W/ 1HR
GLUCOSE, 1 HOUR: 177 mg/dL (ref 65–179)
GLUCOSE, 2 HOUR: 97 mg/dL (ref 65–152)
Glucose, Fasting: 80 mg/dL (ref 65–91)

## 2016-10-09 LAB — HIV ANTIBODY (ROUTINE TESTING W REFLEX): HIV SCREEN 4TH GENERATION: NONREACTIVE

## 2016-10-09 LAB — TYPE AND SCREEN
ABO/RH(D): O POS
ANTIBODY SCREEN: NEGATIVE

## 2016-10-09 LAB — RPR: RPR Ser Ql: NONREACTIVE

## 2016-10-09 MED ORDER — BETAMETHASONE SOD PHOS & ACET 6 (3-3) MG/ML IJ SUSP
12.0000 mg | Freq: Once | INTRAMUSCULAR | Status: AC
  Administered 2016-10-10: 12 mg via INTRAMUSCULAR
  Filled 2016-10-09: qty 2

## 2016-10-09 MED ORDER — ZOLPIDEM TARTRATE 5 MG PO TABS: 5.0000 mg | ORAL_TABLET | Freq: Every evening | ORAL | Status: DC | PRN

## 2016-10-09 MED ORDER — LACTATED RINGERS IV BOLUS (SEPSIS)
1000.0000 mL | Freq: Once | INTRAVENOUS | Status: AC
  Administered 2016-10-09: 1000 mL via INTRAVENOUS

## 2016-10-09 MED ORDER — PRENATAL MULTIVITAMIN CH
1.0000 | ORAL_TABLET | Freq: Every day | ORAL | Status: DC
  Administered 2016-10-09 – 2016-10-16 (×9): 1 via ORAL
  Filled 2016-10-09 (×10): qty 1

## 2016-10-09 MED ORDER — SODIUM CHLORIDE 0.9% FLUSH: 3.0000 mL | INTRAVENOUS | Status: DC | PRN

## 2016-10-09 MED ORDER — CALCIUM CARBONATE ANTACID 500 MG PO CHEW: 2.0000 | CHEWABLE_TABLET | ORAL | Status: DC | PRN

## 2016-10-09 MED ORDER — LIDOCAINE HCL (PF) 1 % IJ SOLN
INTRAMUSCULAR | Status: DC | PRN
  Administered 2016-10-09 (×2): 5 mL via EPIDURAL

## 2016-10-09 MED ORDER — ACETAMINOPHEN 325 MG PO TABS: 650.0000 mg | ORAL_TABLET | ORAL | Status: DC | PRN

## 2016-10-09 MED ORDER — TERBUTALINE SULFATE 1 MG/ML IJ SOLN
0.2500 mg | Freq: Once | INTRAMUSCULAR | Status: AC
  Administered 2016-10-09: 0.25 mg via SUBCUTANEOUS
  Filled 2016-10-09: qty 1

## 2016-10-09 MED ORDER — BETAMETHASONE SOD PHOS & ACET 6 (3-3) MG/ML IJ SUSP
12.0000 mg | Freq: Once | INTRAMUSCULAR | Status: AC
  Administered 2016-10-09: 12 mg via INTRAMUSCULAR
  Filled 2016-10-09: qty 2

## 2016-10-09 MED ORDER — SODIUM CHLORIDE 0.9 % IV SOLN: 250.0000 mL | INTRAVENOUS | Status: DC | PRN

## 2016-10-09 MED ORDER — DOCUSATE SODIUM 100 MG PO CAPS
100.0000 mg | ORAL_CAPSULE | Freq: Every day | ORAL | Status: DC
  Administered 2016-10-09 – 2016-10-16 (×7): 100 mg via ORAL
  Filled 2016-10-09 (×9): qty 1

## 2016-10-09 MED ORDER — FENTANYL 2.5 MCG/ML BUPIVACAINE 1/10 % EPIDURAL INFUSION (WH - ANES)
INTRAMUSCULAR | Status: DC | PRN
  Administered 2016-10-09: 14 mL/h via EPIDURAL

## 2016-10-09 MED ORDER — SODIUM CHLORIDE 0.9% FLUSH
3.0000 mL | Freq: Two times a day (BID) | INTRAVENOUS | Status: DC
  Administered 2016-10-09 – 2016-10-11 (×6): 3 mL via INTRAVENOUS

## 2016-10-09 NOTE — Anesthesia Procedure Notes (Deleted)
Epidural

## 2016-10-09 NOTE — Anesthesia Procedure Notes (Deleted)
Epidural Patient location during procedure: OB Start time: 10/09/2016 4:00 AM End time: 10/09/2016 4:10 AM  Staffing Anesthesiologist: Josephine Igo  Preanesthetic Checklist Completed: patient identified, site marked, surgical consent, pre-op evaluation, timeout performed, IV checked, risks and benefits discussed and monitors and equipment checked  Epidural Patient position: sitting Prep: site prepped and draped and DuraPrep Patient monitoring: continuous pulse ox and blood pressure Approach: midline Location: L4-L5 Injection technique: LOR air  Needle:  Needle type: Tuohy  Needle gauge: 17 G Needle length: 9 cm and 9 Needle insertion depth: 4 cm Catheter type: closed end flexible Catheter size: 19 Gauge Catheter at skin depth: 9 cm Test dose: negative  Assessment Sensory level: T10 Events: blood not aspirated, injection not painful, no injection resistance, negative IV test and no paresthesia

## 2016-10-09 NOTE — MAU Note (Signed)
Patient presents with complaint of vag bleeding.  After she cleaned up, the bleeding continues.  Denies any pain.  Baby usually moves a lot in the morning,  Not moving this morning.  Has a previa.

## 2016-10-09 NOTE — MAU Provider Note (Signed)
Chief Complaint:  Vaginal Bleeding   First Provider Initiated Contact with Patient 10/09/16 [phone]     HPI: Alyssa Wallace is a 31 y.o. [id number] at [redacted]w[redacted]d who presents to maternity admissions reporting vaginal bleeding and no fetal movement this morning. Known LL placenta. No prior bleeds. Blood Type O pos. Has cerclage in place. Hx spontaneous PTD x 2. On 17-P. Hx C/S for Breech.   Associated signs and symptoms: Neg for LOF, abd pain, urinary complaints.  Interpreter at [facility].  Past Medical History:  Diagnosis Date  . Preterm labor    OB History  Gravida Para Term Preterm AB Living  4 2 0 2 1 2   SAB TAB Ectopic Multiple Live Births  1 0 0 0 2    # Outcome Date GA Lbr Len/2nd Weight Sex Delivery Anes PTL Lv  4 Current           3 SAB 2017          2 Preterm 04/05/12 [redacted]w[redacted]d  3 lb 10.6 oz (1.662 kg) F CS-LTranv Spinal  LIV  1 Preterm 01/17/06 [redacted]w[redacted]d  4 lb 6 oz (1.984 kg) M Vag-Spont EPI Y LIV     Birth Comments: PROM     Past Surgical History:  Procedure Laterality Date  . CERVICAL CERCLAGE N/A 07/30/2016   Procedure: CERCLAGE CERVICAL;  Surgeon: Alyssa Mode, MD;  Location: [facility];  Service: Gynecology;  Laterality: N/A;  . CESAREAN SECTION  04/05/2012   Procedure: CESAREAN SECTION;  Surgeon: Alyssa Mode, MD;  Location: [facility];  Service: Obstetrics;  Laterality: N/A;  Primary Cesarean Section Delivery Girl @ [phone], Apgars 5/8   Family History  Problem Relation Age of Onset  . Diabetes Mother   . Diabetes Father   . Diabetes Sister    Social History  Substance Use Topics  . Smoking status: Never Smoker  . Smokeless tobacco: Never Used  . Alcohol use No   No Known Allergies Facility-Administered Medications Prior to Admission  Medication Dose Route Frequency Provider Last Rate Last Dose  . hydroxyprogesterone caproate (MAKENA) 250 mg/mL injection 250 mg  250 mg Intramuscular Weekly Aletha Halim, MD   250 mg at 10/05/16 3149   Prescriptions Prior to Admission   Medication Sig Dispense Refill Last Dose  . acetaminophen (TYLENOL) 325 MG tablet Take 650 mg by mouth every 6 (six) hours as needed.   Taking  . clotrimazole (GYNE-LOTRIMIN) 1 % vaginal cream Place 1 Applicatorful vaginally 2 (two) times daily. 30 g 2   . Prenatal Vit-Fe Fumarate-FA (PRENATAL/FOLIC ACID) TABS Take 1 tablet by mouth daily. 53 each 11 Taking    I have reviewed patient's Past Medical Hx, Surgical Hx, Family Hx, Social Hx, medications and allergies.   ROS:  Review of Systems  Constitutional: Negative for chills and fever.  Gastrointestinal: Negative for abdominal pain.  Genitourinary: Positive for vaginal bleeding. Negative for dysuria, frequency, urgency and vaginal discharge.  Musculoskeletal: Negative for back pain.  Neurological: Negative for dizziness.    Physical Exam  Patient Vitals for the past 24 hrs:  BP Temp Pulse Resp SpO2 Weight  10/09/16 0708 103/70 98.4 F (36.9 C) 77 16 100 % 130 lb 12 oz (59.3 kg)   Constitutional: Well-developed, well-nourished female in no acute distress.  Skin: No pallor.  Cardiovascular: normal rate Respiratory: normal effort GI: Abd soft, non-tender, gravid appropriate for gestational age. MS: Extremities nontender, no edema, normal ROM Neurologic: Alert and oriented x 4.  GU: Neg  CVAT.  Pelvic: NEFG, small-mod amount of dark red blood w/ small clot (enough to soak 3 Fox swabs), cervix clean, visually closed, cerclage seen. Neg pooling. Scant blood coming through os.     Cervical exam deferred.   FHT:  Baseline 145 , moderate variability, accelerations present, no decelerations Contractions: 3-8, painless   MAU Course: Orders Placed This Encounter  Procedures  . [place] MFM OB LIMITED  . CBC  . Urinalysis, Routine w reflex microscopic  . Type and screen   Meds ordered this encounter  Medications  . lactated ringers bolus 1,000 mL  . terbutaline (BRETHINE) injection 0.25 mg  . betamethasone acetate-betamethasone sodium  phosphate (CELESTONE) injection 12 mg   Discussed Hx, labs, exam w/ Dr. Rip Harbour. New orders: OB Limited for CL, eval placenta, Terb, LR bolus. Will determine if pt needs admission based on results, ongoing bleeding.    Care of pt turned over to Jorje Guild, NP at 0830.   [name], [place], [place] 10/09/2016 8:41 AM   -Ultrasound -- CL 2.9 cm, cerclage in place, no evidence of abruption. Per discussion with ultrasonographer, distance of placenta from os was not assessed during this exam -S/w Dr. Roselie Awkward. Will admit; monitor bleeding & give 2nd dose of BMZ  A: 1. History of preterm premature rupture of membranes (PROM) in previous pregnancy, currently pregnant in third trimester   2. Language barrier   3. Low-lying placenta   4. Vaginal bleeding in pregnancy, third trimester   5. Cervical cerclage suture present in third trimester    P: Admit to Antenatal with routine orders Q shift NSTs Betamethasone tomorrow morning Pad count Regular diet  Jorje Guild, NP

## 2016-10-09 NOTE — Anesthesia Preprocedure Evaluation (Deleted)
Anesthesia Evaluation  Patient identified by MRN, date of birth, ID band Patient awake and Patient confused    Reviewed: Allergy & Precautions, H&P , NPO status , Patient's Chart, lab work & pertinent test results, reviewed documented beta blocker date and time   Airway Mallampati: II  TM Distance: >3 FB Neck ROM: full    Dental no notable dental hx.    Pulmonary neg pulmonary ROS,    Pulmonary exam normal breath sounds clear to auscultation       Cardiovascular negative cardio ROS Normal cardiovascular exam Rhythm:regular Rate:Normal     Neuro/Psych negative neurological ROS  negative psych ROS   GI/Hepatic negative GI ROS, Neg liver ROS,   Endo/Other  negative endocrine ROS  Renal/GU negative Renal ROS  negative genitourinary   Musculoskeletal   Abdominal   Peds  Hematology negative hematology ROS (+)   Anesthesia Other Findings   Reproductive/Obstetrics (+) Pregnancy                             Anesthesia Physical Anesthesia Plan  ASA: II  Anesthesia Plan: Epidural   Post-op Pain Management:    Induction:   Airway Management Planned:   Additional Equipment:   Intra-op Plan:   Post-operative Plan:   Informed Consent: I have reviewed the patients History and Physical, chart, labs and discussed the procedure including the risks, benefits and alternatives for the proposed anesthesia with the patient or authorized representative who has indicated his/her understanding and acceptance.     Plan Discussed with:   Anesthesia Plan Comments:         Anesthesia Quick Evaluation

## 2016-10-10 DIAGNOSIS — O4693 Antepartum hemorrhage, unspecified, third trimester: Secondary | ICD-10-CM

## 2016-10-10 MED ORDER — POLYETHYLENE GLYCOL 3350 17 G PO PACK
17.0000 g | PACK | Freq: Every day | ORAL | Status: DC
  Administered 2016-10-10 – 2016-10-13 (×3): 17 g via ORAL
  Filled 2016-10-10 (×5): qty 1

## 2016-10-10 NOTE — Progress Notes (Signed)
FACULTY PRACTICE ANTEPARTUM(COMPREHENSIVE) NOTE  Alyssa Wallace is a 31 y.o. [phone] at [redacted]w[redacted]d by who is admitted for vaginal bleeding.   Fetal presentation is cephalic. Length of Stay:  1  Days  Subjective: Much less bleeding Patient reports the fetal movement as active. Patient reports uterine contraction  activity as none. Patient reports  vaginal bleeding as scant staining. Patient describes fluid per vagina as None.  Vitals:  Blood pressure (!) 92/49, pulse 77, temperature 98.1 F (36.7 C), temperature source Oral, resp. rate 16, height 5' (1.524 m), weight 59.3 kg (130 lb 12 oz), last menstrual period 03/22/2016, SpO2 97 %, unknown if currently breastfeeding. Physical Examination:  General appearance - alert, well appearing, and in no distress Heart - normal rate and regular rhythm Abdomen - soft, nontender, nondistended Fundal Height:  size equals dates Cervical Exam: Not evaluated. . Extremities: extremities normal, atraumatic, no cyanosis or edema and Homans sign is negative, no sign of DVT Membranes:intact  Fetal Monitoring:     Fetal Heart Rate A  Mode External filed at 10/09/2016 2244  Baseline Rate (A) 135 bpm filed at 10/09/2016 2244  Variability 6-25 BPM filed at 10/09/2016 2244  Accelerations 15 x 15 filed at 10/09/2016 2244  Decelerations None filed at 10/09/2016 2244     Labs:  Results for orders placed or performed during the hospital encounter of 10/09/16 (from the past 24 hour(s))  Urinalysis, Routine w reflex microscopic   Collection Time: 10/09/16  8:02 AM  Result Value Ref Range   Color, Urine STRAW (A) YELLOW   APPearance CLEAR CLEAR   Specific Gravity, Urine 1.002 (L) 1.005 - 1.030   pH 7.0 5.0 - 8.0   Glucose, UA NEGATIVE NEGATIVE mg/dL   Hgb urine dipstick LARGE (A) NEGATIVE   Bilirubin Urine NEGATIVE NEGATIVE   Ketones, ur NEGATIVE NEGATIVE mg/dL   Protein, ur NEGATIVE NEGATIVE mg/dL   Nitrite NEGATIVE NEGATIVE   Leukocytes, UA  MODERATE (A) NEGATIVE   RBC / HPF NONE SEEN 0 - 5 RBC/hpf   WBC, UA NONE SEEN 0 - 5 WBC/hpf   Bacteria, UA NONE SEEN NONE SEEN   Squamous Epithelial / LPF NONE SEEN NONE SEEN  CBC   Collection Time: 10/09/16  8:40 AM  Result Value Ref Range   WBC 6.6 4.0 - 10.5 K/uL   RBC 3.89 3.87 - 5.11 MIL/uL   Hemoglobin 13.1 12.0 - 15.0 g/dL   HCT 37.3 36.0 - 46.0 %   MCV 95.9 78.0 - 100.0 fL   MCH 33.7 26.0 - 34.0 pg   MCHC 35.1 30.0 - 36.0 g/dL   RDW 13.6 11.5 - 15.5 %   Platelets 231 150 - 400 K/uL  Type and screen   Collection Time: 10/09/16  8:40 AM  Result Value Ref Range   ABO/RH(D) O POS    Antibody Screen NEG    Sample Expiration 10/12/2016       Medications:  Scheduled . betamethasone acetate-betamethasone sodium phosphate  12 mg Intramuscular Once  . docusate sodium  100 mg Oral Daily  . prenatal multivitamin  1 tablet Oral Q1200  . sodium chloride flush  3 mL Intravenous Q12H   I have reviewed the patient's current medications.  ASSESSMENT: Patient Active Problem List   Diagnosis Date Noted  . Vaginal bleeding in pregnancy, third trimester 10/09/2016  . Low lying placenta, antepartum 09/20/2016  . Supervision of high-risk pregnancy 06/14/2016  . Previous cesarean section complicating pregnancy 17/00/1749  . History of preterm  premature rupture of membranes (PROM) in previous pregnancy, currently pregnant 06/14/2016  . Language barrier 06/14/2016    PLAN: Observe for recurrent bleeding for up to 7 days  Alyssa Wallace 10/10/2016,7:24 AM

## 2016-10-10 NOTE — H&P (Signed)
Jorje Guild, NP  Obstetrics/Gynecology  Cosigned by: Woodroe Mode, MD at 10/09/2016 11:47 AM  Attestation signed by Woodroe Mode, MD at 10/09/2016 11:47 AM  Attestation of Attending Supervision of Advanced Practitioner (CNM/NP/PA): Evaluation and management procedures were performed by the Advanced Practitioner under my supervision and collaboration. I have reviewed the Advanced Practitioner's note and chart, and I agree with the management and plan.  Emeterio Reeve MD        [] Hide copied text [] Hover for attribution information Chief Complaint:  Vaginal Bleeding   First Provider Initiated Contact with Patient 10/09/16 [phone]     HPI: Alyssa Wallace is a 31 y.o. [id number] at [redacted]w[redacted]d who presents to maternity admissions reporting vaginal bleeding and no fetal movement this morning. Known LL placenta. No prior bleeds. Blood Type O pos. Has cerclage in place. Hx spontaneous PTD x 2. On 17-P. Hx C/S for Breech.   Associated signs and symptoms: Neg for LOF, abd pain, urinary complaints.  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      Past Medical History:  Diagnosis Date  . Preterm labor                    OB History  Gravida Para Term Preterm AB Living  4 2 0 2 1 2   SAB TAB Ectopic Multiple Live Births  1 0 0 0 2    # Outcome Date GA Lbr Len/2nd Weight Sex Delivery Anes PTL Lv  4 Current           3 SAB 2017          2 Preterm 04/05/12 [redacted]w[redacted]d  3 lb 10.6 oz (1.662 kg) F CS-LTranv Spinal  LIV  1 Preterm 01/17/06 [redacted]w[redacted]d  4 lb 6 oz (1.984 kg) M Vag-Spont EPI Y LIV     Birth Comments: PROM          Past Surgical History:  Procedure Laterality Date  . CERVICAL CERCLAGE N/A 07/30/2016   Procedure: CERCLAGE CERVICAL;  Surgeon: Woodroe Mode, MD;  Location: [facility];  Service: Gynecology;  Laterality: N/A;  . CESAREAN SECTION  04/05/2012   Procedure: CESAREAN SECTION;  Surgeon: Woodroe Mode, MD;  Location: [facility];  Service: Obstetrics;  Laterality: N/A;   Primary Cesarean Section Delivery Girl @ [phone], Apgars 5/8        Family History  Problem Relation Age of Onset  . Diabetes Mother   . Diabetes Father   . Diabetes Sister        Social History  Substance Use Topics  . Smoking status: Never Smoker  . Smokeless tobacco: Never Used  . Alcohol use No   No Known Allergies          Facility-Administered Medications Prior to Admission  Medication Dose Route Frequency Provider Last Rate Last Dose  . hydroxyprogesterone caproate (MAKENA) 250 mg/mL injection 250 mg  250 mg Intramuscular Weekly Aletha Halim, MD   250 mg at 10/05/16 6962          Prescriptions Prior to Admission  Medication Sig Dispense Refill Last Dose  . acetaminophen (TYLENOL) 325 MG tablet Take 650 mg by mouth every 6 (six) hours as needed.   Taking  . clotrimazole (GYNE-LOTRIMIN) 1 % vaginal cream Place 1 Applicatorful vaginally 2 (two) times daily. 30 g 2   . Prenatal Vit-Fe Fumarate-FA (PRENATAL/FOLIC ACID) TABS Take 1 tablet by mouth daily. 2 each 11 Taking    I have reviewed patient's Past Medical Hx, Surgical  Hx, Family Hx, Social Hx, medications and allergies.   ROS:  Review of Systems  Constitutional: Negative for chills and fever.  Gastrointestinal: Negative for abdominal pain.  Genitourinary: Positive for vaginal bleeding. Negative for dysuria, frequency, urgency and vaginal discharge.  Musculoskeletal: Negative for back pain.  Neurological: Negative for dizziness.    Physical Exam  Patient Vitals for the past 24 hrs:  BP Temp Pulse Resp SpO2 Weight  10/09/16 0708 103/70 98.4 F (36.9 C) 77 16 100 % 130 lb 12 oz (59.3 kg)   Constitutional: Well-developed, well-nourished female in no acute distress.  Skin: No pallor.  Cardiovascular: normal rate Respiratory: normal effort GI: Abd soft, non-tender, gravid appropriate for gestational age. MS: Extremities nontender, no edema, normal ROM Neurologic: Alert and oriented x 4.  GU:  Neg CVAT.             Pelvic: NEFG, small-mod amount of dark red blood w/ small clot (enough to soak 3 Fox swabs), cervix clean, visually closed, cerclage seen. Neg pooling. Scant blood coming through os.                Cervical exam deferred.   FHT:  Baseline 145 , moderate variability, accelerations present, no decelerations Contractions: 3-8, painless   MAU Course:    Orders Placed This Encounter  Procedures  . [place] MFM OB LIMITED  . CBC  . Urinalysis, Routine w reflex microscopic  . Type and screen      Meds ordered this encounter  Medications  . lactated ringers bolus 1,000 mL  . terbutaline (BRETHINE) injection 0.25 mg  . betamethasone acetate-betamethasone sodium phosphate (CELESTONE) injection 12 mg   Discussed Hx, labs, exam w/ Dr. Rip Harbour. New orders: OB Limited for CL, eval placenta, Terb, LR bolus. Will determine if pt needs admission based on results, ongoing bleeding.    Care of pt turned over to Jorje Guild, NP at 0830.   [name], [place], [place] 10/09/2016 8:41 AM   -Ultrasound -- CL 2.9 cm, cerclage in place, no evidence of abruption. Per discussion with ultrasonographer, distance of placenta from os was not assessed during this exam -S/w Dr. Roselie Awkward. Will admit; monitor bleeding & give 2nd dose of BMZ  A: 1. History of preterm premature rupture of membranes (PROM) in previous pregnancy, currently pregnant in third trimester   2. Language barrier   3. Low-lying placenta   4. Vaginal bleeding in pregnancy, third trimester   5. Cervical cerclage suture present in third trimester    P: Admit to Antenatal with routine orders Q shift NSTs Betamethasone tomorrow morning Pad count Regular diet  Jorje Guild, NP     Electronically signed by Manya Silvas, [place] at 10/09/2016 8:41 AM Electronically signed by Manya Silvas, [place] at 10/09/2016 8:42 AM Electronically signed by Jorje Guild, NP at 10/09/2016 10:37 AM Electronically signed by Woodroe Mode, MD at 10/09/2016 11:47 AM

## 2016-10-11 ENCOUNTER — Inpatient Hospital Stay (HOSPITAL_COMMUNITY): Payer: Medicaid Other

## 2016-10-11 MED ORDER — HYDROXYPROGESTERONE CAPROATE 250 MG/ML IM OIL
250.0000 mg | TOPICAL_OIL | Freq: Once | INTRAMUSCULAR | Status: AC
  Administered 2016-10-12: 250 mg via INTRAMUSCULAR
  Filled 2016-10-11: qty 1

## 2016-10-11 MED ORDER — HYDROXYPROGESTERONE CAPROATE 250 MG/ML IM OIL: 250.0000 mg | TOPICAL_OIL | Freq: Once | INTRAMUSCULAR | Status: DC

## 2016-10-11 NOTE — Progress Notes (Signed)
Daily Antepartum Note  Admission Date: 10/09/2016 Current Date: 10/11/2016 10:15 AM  Alyssa Wallace is a 31 y.o. [id number] @ [redacted]w[redacted]d, HD#2, admitted for VB.  Pregnancy complicated by: Patient Active Problem List   Diagnosis Date Noted  . Vaginal bleeding in pregnancy, third trimester 10/09/2016  . Low lying placenta, antepartum 09/20/2016  . Supervision of high-risk pregnancy 06/14/2016  . Previous cesarean section complicating pregnancy 98/33/8250  . History of preterm premature rupture of membranes (PROM) in previous pregnancy, currently pregnant 06/14/2016  . Language barrier 06/14/2016  rescue cerclage in place H/o PTB x 2  Overnight/24hr events:  Had some spotting with wiping  Subjective:  No PTL s/s, decreased FM or VB  Objective:    Current Vital Signs 24h Vital Sign Ranges  T 98.7 F (37.1 C) Temp  Avg: 98.5 F (36.9 C)  Min: 97.7 F (36.5 C)  Max: 98.8 F (37.1 C)  BP (!) 94/58 BP  Min: 88/50  Max: 99/62  HR 77 Pulse  Avg: 76.6  Min: 70  Max: 82  RR 18 Resp  Avg: 17.7  Min: 16  Max: 18  SaO2 99 % Not Delivered SpO2  Avg: 99.4 %  Min: 99 %  Max: 100 %       24 Hour I/O Current Shift I/O  Time Ins Outs No intake/output data recorded. No intake/output data recorded.   Last Bleed: spotting o/n on night of 6/4 into 6/5  Physical exam: General: Well nourished, well developed female in no acute distress. Abdomen: gravid, nttp Cardiovascular: S1, S2 normal, no murmur, rub or gallop, regular rate and rhythm Respiratory: CTAB Extremities: no clubbing, cyanosis or edema Skin: Warm and dry.   Medications: Current Facility-Administered Medications  Medication Dose Route Frequency Provider Last Rate Last Dose  . 0.9 %  sodium chloride infusion  250 mL Intravenous PRN Jorje Guild, NP      . acetaminophen (TYLENOL) tablet 650 mg  650 mg Oral Q4H PRN Jorje Guild, NP      . calcium carbonate (TUMS - dosed in mg elemental calcium) chewable tablet 400 mg of  elemental calcium  2 tablet Oral Q4H PRN Jorje Guild, NP      . docusate sodium (COLACE) capsule 100 mg  100 mg Oral Daily Jorje Guild, NP   100 mg at 10/11/16 0917  . [START ON 10/12/2016] hydroxyprogesterone caproate (MAKENA) 250 mg/mL injection 250 mg  250 mg Intramuscular Once Aletha Halim, MD      . polyethylene glycol (MIRALAX / GLYCOLAX) packet 17 g  17 g Oral Daily Constant, Peggy, MD   17 g at 10/11/16 0917  . prenatal multivitamin tablet 1 tablet  1 tablet Oral Q1200 Jorje Guild, NP   1 tablet at 10/11/16 [phone]  . sodium chloride flush (NS) 0.9 % injection 3 mL  3 mL Intravenous Q12H Jorje Guild, NP   3 mL at 10/11/16 0918  . sodium chloride flush (NS) 0.9 % injection 3 mL  3 mL Intravenous PRN Jorje Guild, NP      . zolpidem (AMBIEN) tablet 5 mg  5 mg Oral QHS PRN Jorje Guild, NP       Facility-Administered Medications Ordered in Other Encounters  Medication Dose Route Frequency Provider Last Rate Last Dose  . fentaNYL 2.5 mcg/ml w/bupivacaine 0.1% in NS 120ml epidural infusion (WH-ANES)   Epidural Continuous PRN Janeece Riggers, MD 14 mL/hr at 10/09/16 0405 14 mL/hr at 10/09/16 0405  . lidocaine (PF) (XYLOCAINE) 1 % injection  Anesthesia Stephannie Li, MD   5 mL at 10/09/16 0402    Labs:  No new labs  Radiology: no new imaging  Assessment & Plan:  Pt stable *Pregnancy: qday NSTs, PN vitamin. Declines BTL *VB: will get TVUS and growth u/s today to follow up LL placenta (couldn't tell yesterday but no e/o abruption, no CL, ceph and normal AFI on TA [place]) -O POS *Preterm: s/p BMZ on 6/2 and 6/3 *h/o PTB: 17p qwk. Due tomorrow (ordered) *rescue cerclage in place: no issues. See above.  *PPx: SCDs *FEN/GI: regular diet *Dispo: keep at least one week s/p last bleed  Interpreter used  Durene Romans. MD Attending Center for [facility])

## 2016-10-12 ENCOUNTER — Ambulatory Visit: Payer: Self-pay

## 2016-10-12 LAB — TYPE AND SCREEN
ABO/RH(D): O POS
ANTIBODY SCREEN: NEGATIVE

## 2016-10-12 NOTE — Progress Notes (Signed)
Notified Dr. Ilda Basset of pt BP 81/47. Pt denies light-headedness and dizziness. Other vital signs WDL. No vaginal bleeding. No new orders at this time. Told to keep monitoring pt.

## 2016-10-12 NOTE — Progress Notes (Signed)
Daily Antepartum Note  Admission Date: 10/09/2016 Current Date: 10/12/2016 7:35 AM  Alyssa Wallace is a 31 y.o. [id number] @ [redacted]w[redacted]d, HD#3, admitted for VB.  Pregnancy complicated by: Patient Active Problem List   Diagnosis Date Noted  . Vaginal bleeding in pregnancy, third trimester 10/09/2016  . Low lying placenta, antepartum 09/20/2016  . Supervision of high-risk pregnancy 06/14/2016  . Previous cesarean section complicating pregnancy 84/13/2440  . History of preterm premature rupture of membranes (PROM) in previous pregnancy, currently pregnant 06/14/2016  . Language barrier 06/14/2016  rescue cerclage in place H/o PTB x 2  Overnight/24hr events:  None  Subjective:  No PTL s/s, decreased FM or VB  Objective:    Current Vital Signs 24h Vital Sign Ranges  T 97.8 F (36.6 C) Temp  Avg: 98.2 F (36.8 C)  Min: 97.8 F (36.6 C)  Max: 98.7 F (37.1 C)  BP (!) 81/47 (notified Dr. Ilda Basset) BP  Min: 81/47  Max: 102/64  HR 72 Pulse  Avg: 72.5  Min: 66  Max: 77  RR 16 Resp  Avg: 17.5  Min: 16  Max: 18  SaO2 99 % Not Delivered SpO2  Avg: 99 %  Min: 97 %  Max: 100 %       24 Hour I/O Current Shift I/O  Time Ins Outs 06/04 0701 - 06/05 0700 In: 3 [I.V.:3] Out: -  No intake/output data recorded.   Last Bleed: spotting o/n on night of 6/4 into 6/5  Physical exam: General: Well nourished, well developed female in no acute distress. Abdomen: gravid, nttp Cardiovascular: S1, S2 normal, no murmur, rub or gallop, regular rate and rhythm Respiratory: CTAB Extremities: no clubbing, cyanosis or edema Skin: Warm and dry.   Medications: Current Facility-Administered Medications  Medication Dose Route Frequency Provider Last Rate Last Dose  . 0.9 %  sodium chloride infusion  250 mL Intravenous PRN Jorje Guild, NP      . acetaminophen (TYLENOL) tablet 650 mg  650 mg Oral Q4H PRN Jorje Guild, NP      . calcium carbonate (TUMS - dosed in mg elemental calcium) chewable tablet  400 mg of elemental calcium  2 tablet Oral Q4H PRN Jorje Guild, NP      . docusate sodium (COLACE) capsule 100 mg  100 mg Oral Daily Jorje Guild, NP   100 mg at 10/11/16 1027  . hydroxyprogesterone caproate (MAKENA) 250 mg/mL injection 250 mg  250 mg Intramuscular Once Aletha Halim, MD      . polyethylene glycol (MIRALAX / GLYCOLAX) packet 17 g  17 g Oral Daily Constant, Peggy, MD   17 g at 10/11/16 0917  . prenatal multivitamin tablet 1 tablet  1 tablet Oral Q1200 Jorje Guild, NP   1 tablet at 10/12/16 0453  . sodium chloride flush (NS) 0.9 % injection 3 mL  3 mL Intravenous Q12H Jorje Guild, NP   3 mL at 10/11/16 2215  . sodium chloride flush (NS) 0.9 % injection 3 mL  3 mL Intravenous PRN Jorje Guild, NP      . zolpidem (AMBIEN) tablet 5 mg  5 mg Oral QHS PRN Jorje Guild, NP       Facility-Administered Medications Ordered in Other Encounters  Medication Dose Route Frequency Provider Last Rate Last Dose  . fentaNYL 2.5 mcg/ml w/bupivacaine 0.1% in NS 1104ml epidural infusion (WH-ANES)   Epidural Continuous PRN Janeece Riggers, MD 14 mL/hr at 10/09/16 0405 14 mL/hr at 10/09/16 0405  . lidocaine (PF) (XYLOCAINE) 1 %  injection    Anesthesia Stephannie Li, MD   5 mL at 10/09/16 0402    Labs:  No new labs  Radiology:  6/4: TVUS CM 1cm and funneling to internal os. Cerclage in place. ?clot in the LUS. No e/o LL placenta  Assessment & Plan:  Pt stable *Pregnancy: qday NSTs, PN vitamin. Declines BTL *VB: continue for at least 7d s/p last bleed and consider rpt u/s prior to d/c to ensure to change in ?clot in the LUS -O POS *Preterm: s/p BMZ on 6/2 and 6/3 *h/o PTB: 17p qwk. Due today (ordered) *rescue cerclage in place: no issues. See above.  *PPx: SCDs *FEN/GI: regular diet *Dispo: keep at least one week s/p last bleed  Interpreter used  Durene Romans. MD Attending Center for [facility])

## 2016-10-13 NOTE — Progress Notes (Signed)
Daily Antepartum Note  Admission Date: 10/09/2016 Current Date: 10/13/2016 3:37 PM  Alyssa Wallace is a 31 y.o. [id number] @ [redacted]w[redacted]d , HD#4, admitted for VB. Denies any continued bleeding. Feeling well today. Good fetal movement.   Pregnancy complicated by: Patient Active Problem List   Diagnosis Date Noted  . Vaginal bleeding in pregnancy, third trimester 10/09/2016  . Low lying placenta, antepartum 09/20/2016  . Supervision of high-risk pregnancy 06/14/2016  . Previous cesarean section complicating pregnancy 74/94/4967  . History of preterm premature rupture of membranes (PROM) in previous pregnancy, currently pregnant 06/14/2016  . Language barrier 06/14/2016  rescue cerclage in place H/o PTB x 2  Overnight/24hr events:  None  Subjective:  No PTL s/s, decreased FM or VB  Objective:    Current Vital Signs 24h Vital Sign Ranges  T 97.8 F (36.6 C) Temp  Avg: 98.2 F (36.8 C)  Min: 97.8 F (36.6 C)  Max: 98.9 F (37.2 C)  BP 99/61 BP  Min: 87/57  Max: 101/63  HR 74 Pulse  Avg: 78  Min: 71  Max: 89  RR 16 Resp  Avg: 16  Min: 16  Max: 16  SaO2 100 % Not Delivered SpO2  Avg: 99 %  Min: 97 %  Max: 100 %       24 Hour I/O Current Shift I/O  Time Ins Outs No intake/output data recorded. No intake/output data recorded.   Last Bleed: spotting o/n on night of 6/4 into 6/5  Physical exam: General: Well nourished, well developed female in no acute distress. Abdomen: gravid, nttp Cardiovascular: S1, S2 normal, no murmur, rub or gallop, regular rate and rhythm Respiratory: CTAB Extremities: no clubbing, cyanosis or edema Skin: Warm and dry.   Medications: Current Facility-Administered Medications  Medication Dose Route Frequency Provider Last Rate Last Dose  . acetaminophen (TYLENOL) tablet 650 mg  650 mg Oral Q4H PRN Jorje Guild, NP      . calcium carbonate (TUMS - dosed in mg elemental calcium) chewable tablet 400 mg of elemental calcium  2 tablet Oral Q4H PRN  Jorje Guild, NP      . docusate sodium (COLACE) capsule 100 mg  100 mg Oral Daily Jorje Guild, NP   100 mg at 10/13/16 0846  . polyethylene glycol (MIRALAX / GLYCOLAX) packet 17 g  17 g Oral Daily Constant, Peggy, MD   17 g at 10/13/16 0847  . prenatal multivitamin tablet 1 tablet  1 tablet Oral Q1200 Jorje Guild, NP   1 tablet at 10/13/16 0846  . zolpidem (AMBIEN) tablet 5 mg  5 mg Oral QHS PRN Jorje Guild, NP       Facility-Administered Medications Ordered in Other Encounters  Medication Dose Route Frequency Provider Last Rate Last Dose  . fentaNYL 2.5 mcg/ml w/bupivacaine 0.1% in NS 124ml epidural infusion (WH-ANES)   Epidural Continuous PRN Janeece Riggers, MD 14 mL/hr at 10/09/16 0405 14 mL/hr at 10/09/16 0405  . lidocaine (PF) (XYLOCAINE) 1 % injection    Anesthesia Stephannie Li, MD   5 mL at 10/09/16 0402    Labs:  No new labs  Radiology:  6/4: TVUS CM 1cm and funneling to internal os. Cerclage in place. ?clot in the LUS. No e/o LL placenta  Assessment & Plan:  Pt stable *Pregnancy: qday NSTs, PN vitamin. Declines BTL *VB: continue for at least 7d s/p last bleed. Plan for [place] later in week to determine location of possible clot in LUS -O POS *Preterm: s/p BMZ  on 6/2 and 6/3 *h/o PTB: 17p qwk. Due today (ordered) *rescue cerclage in place: no issues. See above.  *PPx: SCDs *FEN/GI: regular diet *Dispo: keep at least 5-7d s/p last bleed   Caren Macadam, MD, MPH, ABFM Attending [place] for [facility]

## 2016-10-14 DIAGNOSIS — O4693 Antepartum hemorrhage, unspecified, third trimester: Secondary | ICD-10-CM

## 2016-10-14 NOTE — Progress Notes (Signed)
FACULTY PRACTICE ANTEPARTUM(COMPREHENSIVE) NOTE  Alyssa Wallace is a 31 y.o. [phone] at [redacted]w[redacted]d  by who is admitted for vaginal bleeding.   Fetal presentation is cephalic. Length of Stay:  5  Days  Subjective: No bleeding Patient reports the fetal movement as active. Patient reports uterine contraction  activity as none. Patient reports  vaginal bleeding as scant staining. Patient describes fluid per vagina as None.  Vitals:  Blood pressure (!) 84/52, pulse 85, temperature 98.9 F (37.2 C), temperature source Oral, resp. rate 18, height 5' (1.524 m), weight 130 lb 12 oz (59.3 kg), last menstrual period 03/22/2016, SpO2 98 %, unknown if currently breastfeeding. Physical Examination:  General appearance - alert, well appearing, and in no distress Heart - normal rate and regular rhythm Abdomen - soft, nontender, nondistended Fundal Height:  size equals dates Cervical Exam: Not evaluated. . Extremities: extremities normal, atraumatic, no cyanosis or edema and Homans sign is negative, no sign of DVT Membranes:intact  Fetal Monitoring:     Reassuring 140s no decels   Labs:  No results found for this or any previous visit (from the past 24 hour(s)).    Medications:  Scheduled . docusate sodium  100 mg Oral Daily  . polyethylene glycol  17 g Oral Daily  . prenatal multivitamin  1 tablet Oral Q1200   I have reviewed the patient's current medications.  ASSESSMENT: [redacted]w[redacted]d Estimated Date of Delivery: 12/27/16  Patient Active Problem List   Diagnosis Date Noted  . Vaginal bleeding in pregnancy, third trimester 10/09/2016  . Low lying placenta, antepartum 09/20/2016  . Supervision of high-risk pregnancy 06/14/2016  . Previous cesarean section complicating pregnancy 78/29/5621  . History of preterm premature rupture of membranes (PROM) in previous pregnancy, currently pregnant 06/14/2016  . Language barrier 06/14/2016    PLAN: Observe for recurrent bleeding for up to 7  days May be able to go home tomorrow if no bleeding  [name] H 10/14/2016,7:55 AM     Patient ID: Alyssa Wallace, female   DOB: Apr 16, 1986, 31 y.o.   MRN: [id number]

## 2016-10-15 LAB — TYPE AND SCREEN
ABO/RH(D): O POS
ANTIBODY SCREEN: NEGATIVE

## 2016-10-15 NOTE — Progress Notes (Signed)
FACULTY PRACTICE ANTEPARTUM(COMPREHENSIVE) NOTE  Jamira Barfuss is a 31 y.o. [phone] at [redacted]w[redacted]d who is admitted for vaginal bleeding.   Fetal presentation is unsure. Length of Stay:  6  Days  Subjective:  Patient reports the fetal movement as active. Patient reports uterine contraction  activity as none. Patient reports  vaginal bleeding as none. Patient describes fluid per vagina as None.  Vitals:  Blood pressure (!) 95/58, pulse 87, temperature 97.8 F (36.6 C), temperature source Oral, resp. rate 16, height 5' (1.524 m), weight 130 lb 12 oz (59.3 kg), last menstrual period 03/22/2016, SpO2 99 %, unknown if currently breastfeeding. Physical Examination:  General appearance - alert, well appearing, and in no distress Heart - normal rate and regular rhythm Abdomen - soft, nontender, nondistended Fundal Height:  size equals dates Cervical Exam: Not evaluated. . Extremities: extremities normal, atraumatic, no cyanosis or edema and Homans sign is negative, no sign of DVT  Membranes:intact  Fetal Monitoring:     Fetal Heart Rate A  Mode External filed at 10/15/2016 0951  Baseline Rate (A) 140 bpm filed at 10/15/2016 0951  Variability 6-25 BPM filed at 10/15/2016 0951  Accelerations 15 x 15 filed at 10/15/2016 0951  Decelerations None filed at 10/15/2016 3009     Labs:  No results found for this or any previous visit (from the past 24 hour(s)).  Imaging Studies:     Currently EPIC will not allow sonographic studies to automatically populate into notes.  In the meantime, copy and paste results into note or free text.  Medications:  Scheduled . docusate sodium  100 mg Oral Daily  . polyethylene glycol  17 g Oral Daily  . prenatal multivitamin  1 tablet Oral Q1200   I have reviewed the patient's current medications.  ASSESSMENT: Patient Active Problem List   Diagnosis Date Noted  . Vaginal bleeding in pregnancy, third trimester 10/09/2016  . Low lying placenta,  antepartum 09/20/2016  . Supervision of high-risk pregnancy 06/14/2016  . Previous cesarean section complicating pregnancy 23/30/0762  . History of preterm premature rupture of membranes (PROM) in previous pregnancy, currently pregnant 06/14/2016  . Language barrier 06/14/2016    PLAN: Continue observation in hospital for at least 7 days total after bleeding stopped  Emeterio Reeve 10/15/2016,12:06 PM

## 2016-10-16 MED ORDER — DOCUSATE SODIUM 100 MG PO CAPS: 100.0000 mg | ORAL_CAPSULE | Freq: Every day | ORAL | 0 refills | Status: DC

## 2016-10-16 NOTE — Discharge Summary (Signed)
Physician Discharge Summary  Patient ID: Alyssa Wallace MRN: [id number] DOB/AGE: [birth year] 31 y.o.  Admit date: 10/09/2016 Discharge date: 10/16/2016  Admission Diagnoses:Third trimester bleeding  Discharge Diagnoses:  Active Problems:   Low lying placenta, antepartum   Discharged Condition: good  Hospital Course: HPI: Alyssa Wallace a 31 [id number] at [redacted]w[redacted]d who presents to maternity admissions reporting vaginal bleeding and no fetal movement this morning. Known LL placenta. No prior bleeds. Blood Type O pos. Has cerclage in place. Hx spontaneous PTD x 2. On 17-P. Hx C/S for Breech. The patient had an uneventful hospital course with no additional bleeding. Her last ultrasound showed that the margins of the placenta is 1.8 cm from the internal os. The cerclage is in place there is plenty of cervix that is not dilated above the cerclage patient is aware that no sexual activities allowed   Consults: Pediatrics neonatology  Significant Diagnostic Studies: labs:  CBC Latest Ref Rng & Units 10/09/2016 10/08/2016 07/30/2016  WBC 4.0 - 10.5 K/uL 6.6 6.4 5.7  Hemoglobin 12.0 - 15.0 g/dL 13.1 12.8 12.4  Hematocrit 36.0 - 46.0 % 37.3 37.7 35.5(L)  Platelets 150 - 400 K/uL 231 208 226   O POS    Treatments: IV hydration and betamethasone administered 10/09/2016 and 10/10/2016, 17 P administered 10/12/2016  Discharge Exam: Blood pressure (!) 89/59, pulse 70, temperature 98.2 F (36.8 C), temperature source Oral, resp. rate 18, height 5' (1.524 m), weight 59.3 kg (130 lb 12 oz), last menstrual period 03/22/2016, SpO2 98 %, unknown if currently breastfeeding. General appearance: alert, cooperative and no distress Head: Normocephalic, without obvious abnormality, atraumatic Resp: clear to auscultation bilaterally GI: soft, non-tender; bowel sounds normal; no masses,  no organomegaly  Disposition: 01-Home or Self Care  Discharge Instructions    Call MD for:    Complete by:   As directed    Call MD for:  temperature >100.4    Complete by:  As directed    Diet - low sodium heart healthy    Complete by:  As directed    Increase activity slowly    Complete by:  As directed    No wound care    Complete by:  As directed      Allergies as of 10/16/2016   No Known Allergies     Medication List    STOP taking these medications   clotrimazole 1 % vaginal cream Commonly known as:  GYNE-LOTRIMIN     TAKE these medications   acetaminophen 325 MG tablet Commonly known as:  TYLENOL Take 650 mg by mouth every 6 (six) hours as needed for mild pain, moderate pain, fever or headache.   docusate sodium 100 MG capsule Commonly known as:  COLACE Take 1 capsule (100 mg total) by mouth daily.   PRENATAL/FOLIC ACID Tabs Take 1 tablet by mouth daily.      Follow-up Information    [facility] Follow up in 1 week(s).   Contact information: [place] [phone]          Signed: Jonnie Kind 10/16/2016, 10:16 AM

## 2016-10-16 NOTE — Progress Notes (Signed)
Interpreter at bedside to discuss the plan of care for the day. Toya Smothers, RN

## 2016-10-16 NOTE — Progress Notes (Signed)
Pt discharged with printed instructions. No concerns noted.  Grosser L Shameer Molstad, RN 

## 2016-10-19 ENCOUNTER — Ambulatory Visit (INDEPENDENT_AMBULATORY_CARE_PROVIDER_SITE_OTHER): Payer: Self-pay | Admitting: *Deleted

## 2016-10-19 VITALS — BP 98/60 | HR 69 | Wt 131.5 lb

## 2016-10-19 DIAGNOSIS — O09213 Supervision of pregnancy with history of pre-term labor, third trimester: Secondary | ICD-10-CM

## 2016-10-19 DIAGNOSIS — Z8751 Personal history of pre-term labor: Secondary | ICD-10-CM

## 2016-10-19 NOTE — Progress Notes (Signed)
Alyssa Wallace 250 mg IM administered as scheduled.  Pt tolerated well.  

## 2016-10-21 ENCOUNTER — Ambulatory Visit (INDEPENDENT_AMBULATORY_CARE_PROVIDER_SITE_OTHER): Payer: Self-pay | Admitting: Obstetrics & Gynecology

## 2016-10-21 VITALS — BP 96/65 | HR 79 | Wt 131.7 lb

## 2016-10-21 DIAGNOSIS — O34219 Maternal care for unspecified type scar from previous cesarean delivery: Secondary | ICD-10-CM

## 2016-10-21 DIAGNOSIS — N898 Other specified noninflammatory disorders of vagina: Secondary | ICD-10-CM

## 2016-10-21 DIAGNOSIS — O3433 Maternal care for cervical incompetence, third trimester: Secondary | ICD-10-CM

## 2016-10-21 DIAGNOSIS — O09213 Supervision of pregnancy with history of pre-term labor, third trimester: Secondary | ICD-10-CM

## 2016-10-21 DIAGNOSIS — O26879 Cervical shortening, unspecified trimester: Secondary | ICD-10-CM | POA: Insufficient documentation

## 2016-10-21 DIAGNOSIS — O09219 Supervision of pregnancy with history of pre-term labor, unspecified trimester: Secondary | ICD-10-CM

## 2016-10-21 DIAGNOSIS — O0993 Supervision of high risk pregnancy, unspecified, third trimester: Secondary | ICD-10-CM

## 2016-10-21 DIAGNOSIS — O26893 Other specified pregnancy related conditions, third trimester: Secondary | ICD-10-CM

## 2016-10-21 DIAGNOSIS — O3430 Maternal care for cervical incompetence, unspecified trimester: Secondary | ICD-10-CM

## 2016-10-21 NOTE — Patient Instructions (Signed)
Regrese a la clinica cuando tenga su cita. Si tiene problemas o preguntas, llama a la clinica o vaya a la sala de [place].   Informacin sobre parto y [place] de parto prematuros (Preterm Labor and Birth Information) La duracin de un embarazo normal es de 16 a 41semanas. Se llama trabajo de parto prematuro cuando se inicia antes de las 37semanas de [place]. [place]? Existen mayores probabilidades de trabajo de parto prematuro en mujeres con las siguientes caractersticas:  Tienen ciertas infecciones durante el embarazo, como infeccin de vejiga, infeccin de transmisin sexual o infeccin en el tero (corioamnionitis).  Tienen el cuello del tero ms corto que lo normal.  Tuvieron trabajo de parto prematuro anteriormente.  Se sometieron a una ciruga en el cuello del tero.  Son menores de 17aos o mayores de 72aos de edad.  Son afroamericanas.  Estn embarazadas de [employer] o de varios bebs (gestacin mltiple).  Consumen drogas o fuman mientras estn embarazadas.  No aumentan de peso lo suficiente durante el [employer].  Se embarazan poco despus de [employer] CULES SON LOS SNTOMAS DEL [place]? Los sntomas del trabajo de parto prematuro incluyen lo siguiente:  Marketing executive similares a los que ocurren durante el perodo menstrual. Los calambres pueden presentarse con diarrea.  Dolor en el abdomen o en la parte inferior de la espalda.  Contracciones uterinas regulares que se pueden sentir como una presin en el abdomen.  Una sensacin de mayor presin en la pelvis.  Aumento de la secrecin de moco acuoso o sanguinolento en la vagina.  Rotura de bolsa (rotura de saco amnitico). POR QU ES IMPORTANTE RECONOCER LOS SIGNOS DEL [place]? Es Glass blower/designer los signos del trabajo de parto prematuro porque los bebs que nacen de  forma prematura pueden no estar completamente desarrollados. Por lo tanto, pueden correr mayor riesgo de lo siguiente:  Problemas cardacos y pulmonares a Barrister's clerk (crnicos).  Inmediatamente despus del parto, dificultades para regular los sistemas corporales, que incluyen glucemia, temperatura corporal, frecuencia cardaca y frecuencia respiratoria.  Hemorragia cerebral.  Parlisis cerebral.  Dificultades en el aprendizaje.  Muerte. Estos riesgos son [employer] para bebs que nacen antes de las 34semanas de [place]. [place]? El tratamiento depende del tiempo de su [place], su afeccin y la salud de su beb. Puede incluir lo siguiente:  Tener un punto (sutura) en el cuello del tero para evitar que este se abra demasiado pronto (cerclaje).  Tomar medicamentos, por ejemplo: ? Medicamentos hormonales. Estos se pueden administrar de forma temprana en el embarazo para ayudar a Comptroller. ? [place] contracciones. ? Medicamentos que ayudan a [employer] del beb. Estos se pueden recetar si el riesgo de parto es [place]. ? Medicamentos para evitar que el beb desarrolle parlisis cerebral. Si el trabajo de parto de inicia antes de las 34semanas de [place], es posible que deba hospitalizarse. QU DEBO HACER SI CREO QUE ESTOY EN TRABAJO DE [place]? Si cree que est iniciando trabajo de parto prematuro, llame al mdico de inmediato. [place] DE PARTO PREMATURO EN FUTUROS EMBARAZOS? Para aumentar las probabilidades de tener un embarazo a trmino, Dance movement psychotherapist en cuenta lo siguiente:  No consuma ningn producto que contenga tabaco, lo que incluye cigarrillos, tabaco de Higher education careers adviser y Psychologist, sport and exercise. Si necesita ayuda para dejar de fumar, consulte al mdico.  No consuma drogas ni medicamentos que no sean recetados durante el embarazo.  Hable con el mdico antes de tomar suplementos  a base de hierbas aunque los [employer].  Asegrese de llegar a un peso Tax adviser.  Tenga cuidado con las infecciones. Si cree que puede tener una infeccin, consulte al mdico para que la revisen.  Asegrese de informarle al mdico si ha tenido trabajo de parto prematuro antes. Esta informacin no tiene Marine scientist el consejo del mdico. Asegrese de hacerle al mdico cualquier pregunta que tenga. Document Released: 08/03/2007 Document Revised: 12/27/2012 Document Reviewed: 09/17/2015 Elsevier Interactive Patient Education  [employer].

## 2016-10-21 NOTE — Progress Notes (Addendum)
   PRENATAL VISIT NOTE  Subjective:  Alyssa Wallace is a 31 y.o. [phone] at [redacted]w[redacted]d being seen today for ongoing prenatal care.  Patient is Spanish-speaking only, Spanish interpreter present for this encounter. She is currently monitored for the following issues for this high-risk pregnancy and has Supervision of high-risk pregnancy; Previous cesarean section complicating pregnancy; Previous preterm delivery x 2, antepartum; Language barrier; and Short cervix with cervical cerclage, antepartum on her problem list.  Patient reports vaginal irritation and vaginal discharge for a few weeks, yellow in color occasionally pruritic.  Contractions: Not present. Vag. Bleeding: None.  Movement: Present. Denies leaking of fluid.   The following portions of the patient's history were reviewed and updated as appropriate: allergies, current medications, past family history, past medical history, past social history, past surgical history and problem list. Problem list updated.  Objective:   Vitals:   10/21/16 0801  BP: 96/65  Pulse: 79  Weight: 131 lb 11.2 oz (59.7 kg)    Fetal Status: Fetal Heart Rate (bpm): 146 Fundal Height: 30 cm Movement: Present     General:  Alert, oriented and cooperative. Patient is in no acute distress.  Skin: Skin is warm and dry. No rash noted.   Cardiovascular: Normal heart rate noted  Respiratory: Normal respiratory effort, no problems with respiration noted  Abdomen: Soft, gravid, appropriate for gestational age. Pain/Pressure: Present     Pelvic:  Cervical exam deferred      Yellow discharge seen, sample obtained for testing  Extremities: Normal range of motion.  Edema: None  Mental Status: Normal mood and affect. Normal behavior. Normal judgment and thought content.   Assessment and Plan:  Pregnancy: [id number] at [redacted]w[redacted]d  1. Vaginal discharge during pregnancy in third trimester - Cervicovaginal ancillary only done, will follow up results and manage  accordingly.  2. Previous preterm delivery x 2, antepartum 3. Short cervix with cervical cerclage, antepartum Continue weekly 17P, PTL precautions reviewed  4. Previous cesarean section complicating pregnancy Consented for TOLAC already.  5. Supervision of high risk pregnancy in third trimester Preterm labor symptoms and general obstetric precautions including but not limited to vaginal bleeding, contractions, leaking of fluid and fetal movement were reviewed in detail with the patient. Please refer to After Visit Summary for other counseling recommendations.  Return in about 5 days (around 10/26/2016) for 17P only.  11/02/16: 17P and HOB.   Verita Schneiders, MD

## 2016-10-21 NOTE — Progress Notes (Signed)
Spanish Interpreter Raquel Mora 

## 2016-10-22 LAB — CERVICOVAGINAL ANCILLARY ONLY
Bacterial vaginitis: NEGATIVE
Candida vaginitis: POSITIVE — AB
Chlamydia: NEGATIVE
Neisseria Gonorrhea: NEGATIVE
Trichomonas: NEGATIVE

## 2016-10-24 MED ORDER — TERCONAZOLE 0.4 % VA CREA: 1.0000 | TOPICAL_CREAM | Freq: Every day | VAGINAL | 0 refills | Status: DC

## 2016-10-24 NOTE — Addendum Note (Signed)
Addended by: Verita Schneiders A on: 10/24/2016 11:23 AM   Modules accepted: Orders

## 2016-10-25 ENCOUNTER — Telehealth: Payer: Self-pay | Admitting: *Deleted

## 2016-10-25 NOTE — Telephone Encounter (Signed)
Called [place] with Interpreter [name] and notified her wet prep shows yeast infection and rx sent in to pharmacy. Alyssa Wallace voices understanding and no questions voiced.

## 2016-10-26 ENCOUNTER — Ambulatory Visit (INDEPENDENT_AMBULATORY_CARE_PROVIDER_SITE_OTHER): Payer: Self-pay

## 2016-10-26 VITALS — BP 101/65 | HR 72

## 2016-10-26 DIAGNOSIS — O09213 Supervision of pregnancy with history of pre-term labor, third trimester: Secondary | ICD-10-CM

## 2016-10-26 DIAGNOSIS — Z8751 Personal history of pre-term labor: Secondary | ICD-10-CM

## 2016-10-26 NOTE — Progress Notes (Signed)
17 INJECTION

## 2016-11-03 ENCOUNTER — Ambulatory Visit (INDEPENDENT_AMBULATORY_CARE_PROVIDER_SITE_OTHER): Payer: Self-pay | Admitting: Obstetrics & Gynecology

## 2016-11-03 VITALS — BP 102/64 | HR 80 | Wt 134.0 lb

## 2016-11-03 DIAGNOSIS — O3430 Maternal care for cervical incompetence, unspecified trimester: Principal | ICD-10-CM

## 2016-11-03 DIAGNOSIS — O26879 Cervical shortening, unspecified trimester: Secondary | ICD-10-CM

## 2016-11-03 DIAGNOSIS — O3433 Maternal care for cervical incompetence, third trimester: Secondary | ICD-10-CM

## 2016-11-03 DIAGNOSIS — O0992 Supervision of high risk pregnancy, unspecified, second trimester: Secondary | ICD-10-CM

## 2016-11-03 DIAGNOSIS — O09213 Supervision of pregnancy with history of pre-term labor, third trimester: Secondary | ICD-10-CM

## 2016-11-03 DIAGNOSIS — Z8751 Personal history of pre-term labor: Secondary | ICD-10-CM

## 2016-11-03 DIAGNOSIS — O26873 Cervical shortening, third trimester: Secondary | ICD-10-CM

## 2016-11-03 DIAGNOSIS — O0993 Supervision of high risk pregnancy, unspecified, third trimester: Secondary | ICD-10-CM

## 2016-11-03 NOTE — Progress Notes (Signed)
   PRENATAL VISIT NOTE  Subjective:  Alyssa Wallace is a 31 y.o. [phone] at [redacted]w[redacted]d being seen today for ongoing prenatal care.  She is currently monitored for the following issues for this high-risk pregnancy and has Supervision of high-risk pregnancy; Previous cesarean section complicating pregnancy; Previous preterm delivery x 2, antepartum; Language barrier; and Short cervix with cervical cerclage, antepartum on her problem list.  Patient reports brown discharge 5 days ago, resolved since then. Was treated for yeast 2 weeks ago.  Contractions: Not present. Vag. Bleeding: None.  Movement: Present. Denies leaking of fluid.   The following portions of the patient's history were reviewed and updated as appropriate: allergies, current medications, past family history, past medical history, past social history, past surgical history and problem list. Problem list updated.  Objective:   Vitals:   11/03/16 1454  BP: 102/64  Pulse: 80  Weight: 134 lb (60.8 kg)    Fetal Status: Fetal Heart Rate (bpm): 145 Fundal Height: 32 cm Movement: Present     General:  Alert, oriented and cooperative. Patient is in no acute distress.  Skin: Skin is warm and dry. No rash noted.   Cardiovascular: Normal heart rate noted  Respiratory: Normal respiratory effort, no problems with respiration noted  Abdomen: Soft, gravid, appropriate for gestational age. Pain/Pressure: Present     Pelvic:  Cervical exam deferred        Extremities: Normal range of motion.  Edema: None  Mental Status: Normal mood and affect. Normal behavior. Normal judgment and thought content.   Assessment and Plan:  Pregnancy: [id number] at [redacted]w[redacted]d  1. Short cervix with cervical cerclage, antepartum 2. History of preterm delivery Patient reassured about brown discharge, likely secondary to recent yeast infection or cerclage. Bleeding precautions reviewed.  Continue weekly 17P.  3. Supervision of high risk pregnancy in second  trimester Preterm labor symptoms and general obstetric precautions including but not limited to vaginal bleeding, contractions, leaking of fluid and fetal movement were reviewed in detail with the patient. Please refer to After Visit Summary for other counseling recommendations.  Return in about 1 week (around 11/10/2016) for 17P only. 2 weeks HOB and 17P.   Verita Schneiders, MD

## 2016-11-03 NOTE — Progress Notes (Signed)
Patient reports that she saw discharge on Friday and Sunday that was brownish. She did not see any blood.

## 2016-11-03 NOTE — Patient Instructions (Signed)
Regrese a la clinica cuando tenga su cita. Si tiene problemas o preguntas, llama a la clinica o vaya a la [facility].    

## 2016-11-09 ENCOUNTER — Other Ambulatory Visit (HOSPITAL_COMMUNITY): Payer: Self-pay | Admitting: Obstetrics and Gynecology

## 2016-11-09 ENCOUNTER — Ambulatory Visit (HOSPITAL_COMMUNITY)
Admission: RE | Admit: 2016-11-09 | Discharge: 2016-11-09 | Disposition: A | Discharge status: Home / Self Care | Payer: Self-pay | Source: Ambulatory Visit | Attending: Obstetrics and Gynecology | Admitting: Obstetrics and Gynecology

## 2016-11-09 ENCOUNTER — Encounter (HOSPITAL_COMMUNITY): Payer: Self-pay

## 2016-11-09 ENCOUNTER — Ambulatory Visit (INDEPENDENT_AMBULATORY_CARE_PROVIDER_SITE_OTHER): Payer: Self-pay | Admitting: *Deleted

## 2016-11-09 DIAGNOSIS — O34219 Maternal care for unspecified type scar from previous cesarean delivery: Secondary | ICD-10-CM

## 2016-11-09 DIAGNOSIS — O09219 Supervision of pregnancy with history of pre-term labor, unspecified trimester: Principal | ICD-10-CM

## 2016-11-09 DIAGNOSIS — Z362 Encounter for other antenatal screening follow-up: Secondary | ICD-10-CM | POA: Insufficient documentation

## 2016-11-09 DIAGNOSIS — O09213 Supervision of pregnancy with history of pre-term labor, third trimester: Secondary | ICD-10-CM

## 2016-11-09 DIAGNOSIS — O4693 Antepartum hemorrhage, unspecified, third trimester: Secondary | ICD-10-CM

## 2016-11-09 DIAGNOSIS — Z3A33 33 weeks gestation of pregnancy: Secondary | ICD-10-CM | POA: Insufficient documentation

## 2016-11-09 DIAGNOSIS — O4440 Low lying placenta NOS or without hemorrhage, unspecified trimester: Secondary | ICD-10-CM

## 2016-11-09 DIAGNOSIS — O3433 Maternal care for cervical incompetence, third trimester: Secondary | ICD-10-CM

## 2016-11-09 DIAGNOSIS — O09899 Supervision of other high risk pregnancies, unspecified trimester: Secondary | ICD-10-CM

## 2016-11-18 ENCOUNTER — Ambulatory Visit (INDEPENDENT_AMBULATORY_CARE_PROVIDER_SITE_OTHER): Payer: Self-pay | Admitting: Obstetrics and Gynecology

## 2016-11-18 VITALS — BP 102/68 | HR 74 | Wt 135.6 lb

## 2016-11-18 DIAGNOSIS — O34219 Maternal care for unspecified type scar from previous cesarean delivery: Secondary | ICD-10-CM

## 2016-11-18 DIAGNOSIS — O26879 Cervical shortening, unspecified trimester: Secondary | ICD-10-CM

## 2016-11-18 DIAGNOSIS — O26873 Cervical shortening, third trimester: Secondary | ICD-10-CM

## 2016-11-18 DIAGNOSIS — O3433 Maternal care for cervical incompetence, third trimester: Secondary | ICD-10-CM

## 2016-11-18 DIAGNOSIS — O3430 Maternal care for cervical incompetence, unspecified trimester: Principal | ICD-10-CM

## 2016-11-18 DIAGNOSIS — O09213 Supervision of pregnancy with history of pre-term labor, third trimester: Secondary | ICD-10-CM

## 2016-11-18 DIAGNOSIS — O09219 Supervision of pregnancy with history of pre-term labor, unspecified trimester: Secondary | ICD-10-CM

## 2016-11-18 DIAGNOSIS — O0993 Supervision of high risk pregnancy, unspecified, third trimester: Secondary | ICD-10-CM

## 2016-11-18 DIAGNOSIS — Z789 Other specified health status: Secondary | ICD-10-CM

## 2016-11-18 NOTE — Progress Notes (Signed)
Subjective:  Alyssa Wallace is a 31 y.o. [phone] at [redacted]w[redacted]d being seen today for ongoing prenatal care.  She is currently monitored for the following issues for this high-risk pregnancy and has Supervision of high-risk pregnancy; Previous cesarean section complicating pregnancy; Previous preterm delivery x 2, antepartum; Language barrier; and Short cervix with cervical cerclage, antepartum on her problem list.  Patient reports contractions since since 5 AM.  Contractions: Irritability. Vag. Bleeding: None.  Movement: Present. Denies leaking of fluid.   The following portions of the patient's history were reviewed and updated as appropriate: allergies, current medications, past family history, past medical history, past social history, past surgical history and problem list. Problem list updated.  Objective:   Vitals:   11/18/16 1009  BP: 102/68  Pulse: 74  Weight: 135 lb 9.6 oz (61.5 kg)    Fetal Status: Fetal Heart Rate (bpm): 148   Movement: Present     General:  Alert, oriented and cooperative. Patient is in no acute distress.  Skin: Skin is warm and dry. No rash noted.   Cardiovascular: Normal heart rate noted  Respiratory: Normal respiratory effort, no problems with respiration noted  Abdomen: Soft, gravid, appropriate for gestational age. Pain/Pressure: Present     Pelvic:  Cervical exam performed        Extremities: Normal range of motion.  Edema: Trace  Mental Status: Normal mood and affect. Normal behavior. Normal judgment and thought content.   Urinalysis:      Assessment and Plan:  Pregnancy: [id number] at [redacted]w[redacted]d  1. Short cervix with cervical cerclage, antepartum Cerclage in place  2. Language barrier Interrupter   3. Previous cesarean section complicating pregnancy Desires repeat  VBAC papers signed  4. Supervision of high risk pregnancy in third trimester Stable  5. Previous preterm delivery x 2, antepartum Stable Continue with 17 OHP  Preterm labor  symptoms and general obstetric precautions including but not limited to vaginal bleeding, contractions, leaking of fluid and fetal movement were reviewed in detail with the patient. Please refer to After Visit Summary for other counseling recommendations.  No Follow-up on file.   Chancy Milroy, MD

## 2016-11-25 ENCOUNTER — Ambulatory Visit (INDEPENDENT_AMBULATORY_CARE_PROVIDER_SITE_OTHER): Payer: Self-pay | Admitting: Obstetrics & Gynecology

## 2016-11-25 VITALS — BP 98/68 | HR 79 | Wt 141.0 lb

## 2016-11-25 DIAGNOSIS — O09213 Supervision of pregnancy with history of pre-term labor, third trimester: Secondary | ICD-10-CM

## 2016-11-25 DIAGNOSIS — O26873 Cervical shortening, third trimester: Secondary | ICD-10-CM

## 2016-11-25 DIAGNOSIS — O0993 Supervision of high risk pregnancy, unspecified, third trimester: Secondary | ICD-10-CM

## 2016-11-25 DIAGNOSIS — O26879 Cervical shortening, unspecified trimester: Secondary | ICD-10-CM

## 2016-11-25 DIAGNOSIS — O3430 Maternal care for cervical incompetence, unspecified trimester: Secondary | ICD-10-CM

## 2016-11-25 DIAGNOSIS — O09219 Supervision of pregnancy with history of pre-term labor, unspecified trimester: Secondary | ICD-10-CM

## 2016-11-25 DIAGNOSIS — O3433 Maternal care for cervical incompetence, third trimester: Secondary | ICD-10-CM

## 2016-11-25 NOTE — Patient Instructions (Signed)
Cerclaje cervical (Cervical Cerclage) El cerclaje cervical es un procedimiento quirrgico para solucionar un cuello del tero incompetente. Un cuello del tero incompetente es un cuello dbil que se abre antes de que comience el trabajo de parto. El cerclaje cervical es un procedimiento en el que se sutura el cuello del tero para cerrarlo durante el embarazo.  INFORME A SU MDICO:   Cualquier alergia que tenga.  Todos los medicamentos que utiliza, incluidos vitaminas, hierbas, gotas oftlmicas, cremas y medicamentos de venta libre.  Problemas previos que usted o los miembros de su familia hayan tenido con el uso de anestsicos.  Enfermedades de la sangre.  Cirugas previas.  Afecciones mdicas que tenga.  Resfros o infecciones recientes. RIESGOS Y COMPLICACIONES  En general, se trata de un procedimiento seguro. Sin embargo, como en cualquier procedimiento, pueden surgir problemas. Estos son algunos posibles problemas:  Infeccin.  Hemorragias.  Ruptura del saco amnitico (membranas).  Comenzar el trabajo de parto y el parto de forma prematura.  Problemas con la anestesia.  Infeccin del saco amnitico. ANTES DEL PROCEDIMIENTO   Consulte a su mdico si debe cambiar o suspender sus medicamentos.  No coma ni beba nada durante las 6 a 8horas previas al procedimiento.  Pdale a alguna persona que la lleve a su casa luego del procedimiento. PROCEDIMIENTO   Se le colocar una sonda intravenosa en una de las venas. Le darn un sedante para que pueda relajarse.  Le aplicarn un medicamento que la har dormir durante el procedimiento (anestesia general) o le inyectarn un medicamento para adormecer la zona de la cintura para abajo (anestesia espinal o anestesia epidural). Usted dormir o tendr el cuerpo adormecido durante todo el procedimiento.  Le colocarn un espculo en la vagina para visualizar el cuello del tero.  Luego el cuello del tero se toma y se sutura de manera  apretada.  Podrn utilizar una ecografa para guiar el procedimiento y controlar al beb. DESPUS DEL PROCEDIMIENTO   La llevarn a una sala de recuperacin y controlarn al beb que an no ha nacido. Una vez que despierte, se encuentre estabilizada y pueda ingerir lquidos sin problemas, podr volver a su habitacin.  Deber permanecer en el hospital durante la noche.  Le aplicarn una inyeccin de progesterona para evitar las contracciones uterinas.  Le darn analgsicos para que tome en su casa.  Pdale a alguna persona que la lleve de vuelta a su casa y que permanezca con usted durante 2 das. Esta informacin no tiene como fin reemplazar el consejo del mdico. Asegrese de hacerle al mdico cualquier pregunta que tenga. Document Released: 07/23/2008 Document Revised: 05/01/2013 Elsevier Interactive Patient Education  2017 Elsevier Inc.  

## 2016-11-25 NOTE — Progress Notes (Signed)
   PRENATAL VISIT NOTE  Subjective:  Alyssa Wallace is a 31 y.o. [phone] at [redacted]w[redacted]d being seen today for ongoing prenatal care.  She is currently monitored for the following issues for this high-risk pregnancy and has Supervision of high-risk pregnancy; Previous cesarean section complicating pregnancy; Previous preterm delivery x 2, antepartum; Language barrier; and Short cervix with cervical cerclage, antepartum on her problem list.  Patient reports no complaints.  Contractions: Irritability.  .  Movement: Present. Denies leaking of fluid.   The following portions of the patient's history were reviewed and updated as appropriate: allergies, current medications, past family history, past medical history, past social history, past surgical history and problem list. Problem list updated.  Objective:   Vitals:   11/25/16 1108  BP: 98/68  Pulse: 79  Weight: 141 lb (64 kg)    Fetal Status: Fetal Heart Rate (bpm): 136   Movement: Present     General:  Alert, oriented and cooperative. Patient is in no acute distress.  Skin: Skin is warm and dry. No rash noted.   Cardiovascular: Normal heart rate noted  Respiratory: Normal respiratory effort, no problems with respiration noted  Abdomen: Soft, gravid, appropriate for gestational age.  Pain/Pressure: Present     Pelvic: Cervical exam deferred        Extremities: Normal range of motion.  Edema: Trace  Mental Status:  Normal mood and affect. Normal behavior. Normal judgment and thought content.   Assessment and Plan:  Pregnancy: [id number] at [redacted]w[redacted]d  1. Previous preterm delivery x 2, antepartum Has cerclage  2. Short cervix with cervical cerclage, antepartum Remove cerclage in 1 week  3. Supervision of high risk pregnancy in third trimester   Preterm labor symptoms and general obstetric precautions including but not limited to vaginal bleeding, contractions, leaking of fluid and fetal movement were reviewed in detail with the  patient. Please refer to After Visit Summary for other counseling recommendations.  Return in about 1 week (around 12/02/2016) for 17 p and remove cerclage.   Emeterio Reeve, MD

## 2016-12-02 ENCOUNTER — Ambulatory Visit (INDEPENDENT_AMBULATORY_CARE_PROVIDER_SITE_OTHER): Payer: Self-pay | Admitting: Obstetrics & Gynecology

## 2016-12-02 ENCOUNTER — Encounter: Payer: Self-pay | Admitting: Obstetrics & Gynecology

## 2016-12-02 VITALS — BP 99/66 | HR 84 | Wt 142.0 lb

## 2016-12-02 DIAGNOSIS — Z113 Encounter for screening for infections with a predominantly sexual mode of transmission: Secondary | ICD-10-CM

## 2016-12-02 DIAGNOSIS — O09893 Supervision of other high risk pregnancies, third trimester: Secondary | ICD-10-CM

## 2016-12-02 DIAGNOSIS — O09213 Supervision of pregnancy with history of pre-term labor, third trimester: Secondary | ICD-10-CM

## 2016-12-02 LAB — OB RESULTS CONSOLE GBS: GBS: POSITIVE

## 2016-12-02 MED ORDER — HYDROXYPROGESTERONE CAPROATE 250 MG/ML IM OIL
250.0000 mg | TOPICAL_OIL | Freq: Once | INTRAMUSCULAR | Status: AC
  Administered 2016-12-02: 250 mg via INTRAMUSCULAR

## 2016-12-02 NOTE — Progress Notes (Signed)
   PRENATAL VISIT NOTE  Subjective:  Alyssa Wallace is a 31 y.o. [phone] at [redacted]w[redacted]d being seen today for ongoing prenatal care.  She is currently monitored for the following issues for this high-risk pregnancy and has Supervision of high-risk pregnancy; Previous cesarean section complicating pregnancy; Previous preterm delivery x 2, antepartum; Language barrier; and Short cervix with cervical cerclage, antepartum on her problem list.  Patient reports no complaints.  Contractions: Not present. Vag. Bleeding: None.  Movement: Present. Denies leaking of fluid.   The following portions of the patient's history were reviewed and updated as appropriate: allergies, current medications, past family history, past medical history, past social history, past surgical history and problem list. Problem list updated.  Objective:   Vitals:   12/02/16 0848  BP: 99/66  Pulse: 84  Weight: 64.4 kg (142 lb)    Fetal Status: Fetal Heart Rate (bpm): 140   Movement: Present     General:  Alert, oriented and cooperative. Patient is in no acute distress.  Skin: Skin is warm and dry. No rash noted.   Cardiovascular: Normal heart rate noted  Respiratory: Normal respiratory effort, no problems with respiration noted  Abdomen: Soft, gravid, appropriate for gestational age.  Pain/Pressure: Absent     Pelvic: Cervical exam performed        Extremities: Normal range of motion.  Edema: Trace  Mental Status:  Normal mood and affect. Normal behavior. Normal judgment and thought content.   Assessment and Plan:  Pregnancy: [id number] at [redacted]w[redacted]d  1. History of preterm delivery, currently pregnant in third trimester - Cerclage removed. The stitch was buried under the cervical covering. I had to lift it up strongly and actually cut some of the surface epithelium of the cervix to get the knot out. - Cervicovaginal ancillary only - Culture, beta strep (group b only) - hydroxyprogesterone caproate (MAKENA) 250 mg/mL  injection 250 mg; Inject 1 mL (250 mg total) into the muscle once.  Preterm labor symptoms and general obstetric precautions including but not limited to vaginal bleeding, contractions, leaking of fluid and fetal movement were reviewed in detail with the patient. Please refer to After Visit Summary for other counseling recommendations.  No Follow-up on file.   Emily Filbert, MD

## 2016-12-03 LAB — CERVICOVAGINAL ANCILLARY ONLY
CHLAMYDIA, DNA PROBE: NEGATIVE
NEISSERIA GONORRHEA: NEGATIVE

## 2016-12-05 LAB — CULTURE, BETA STREP (GROUP B ONLY): Strep Gp B Culture: POSITIVE — AB

## 2016-12-06 ENCOUNTER — Encounter: Payer: Self-pay | Admitting: Obstetrics & Gynecology

## 2016-12-06 DIAGNOSIS — O99820 Streptococcus B carrier state complicating pregnancy: Secondary | ICD-10-CM | POA: Insufficient documentation

## 2016-12-09 ENCOUNTER — Ambulatory Visit (INDEPENDENT_AMBULATORY_CARE_PROVIDER_SITE_OTHER): Payer: Self-pay | Admitting: Obstetrics & Gynecology

## 2016-12-09 ENCOUNTER — Encounter: Payer: Self-pay | Admitting: Obstetrics & Gynecology

## 2016-12-09 VITALS — BP 102/71 | HR 79 | Wt 136.8 lb

## 2016-12-09 DIAGNOSIS — O0993 Supervision of high risk pregnancy, unspecified, third trimester: Secondary | ICD-10-CM

## 2016-12-09 NOTE — Progress Notes (Signed)
Spanish interpreter Racquel present for visit

## 2016-12-09 NOTE — Patient Instructions (Signed)
Parto vaginal despus de una cesrea (Vaginal Birth After Cesarean Delivery) Un parto vaginal despus de un parto por cesrea es dar a luz por la vagina despus de haber dado a luz por medio de una intervencin quirrgica. En el pasado, si una mujer tena un beb por cesrea, todos los partos posteriores deban hacerse por cesrea. Esto ya no es as. Puede ser seguro para la mam intentar un parto vaginal luego de una cesrea. Es importante que converse con su mdico desde comienzos del embarazo de modo que pueda comprender los riesgos, beneficios y opciones. Le dar tiempo para decidir qu es lo mejor en su caso particular. La decisin final de tener un parto vaginal o por cesrea debe tomarse en conjunto, entre usted y el mdico. Cualquier cambio en su salud o la de su beb durante el embarazo puede ser motivo de un cambio de decisin respecto del parto vaginal. LAS MUJERES QUE OPTAN POR EL PARTO VAGINAL, DEBEN CONSULTAR AL MDICO PARA ASEGURARSE DE QUE:  La cesrea anterior se haya realizado con un corte (incisin) uterino transversal (no con una incisin vertical clsica).  El canal de parto es lo suficientemente grande como para que pase el nio.  No ha sido sometida a otras operaciones del tero.  Durante el trabajo de parto, le realizarn un monitoreo fetal electrnico, en todo momento.  Habr un quirfano disponible y listo en caso de necesitar una cesrea de emergencia.  Un mdico y personal de quirfano estarn disponibles en todo momento durante el trabajo de parto, para realizar una cesrea en caso de ser necesario.  Habr un anestesista disponible en caso de necesitar una cesrea de emergencia.  La nursery est lista y cuenta con personal especializado y el equipo disponible para cuidar al beb en caso de emergencia. BENEFICIOS DEL PARTO VAGINAL:  Permanencia ms breve en el hospital.  Prevencin de los riesgos asociados con el parto por cesrea, por ejemplo: ? Complicaciones  quirrgicas, como apertura o hernia de la incisin. ? Lesiones en otros rganos. ? Fiebre. Esto puede ocurrir si aparece una infeccin despus de la ciruga. Tambin puede ocurrir como reaccin a los medicamentos administrados para adormecerla durante la ciruga.  Menos prdida de sangre y menos probabilidad de necesitar una transfusin sangunea.  Menor riesgo de cogulos sanguneos e infeccin.  Tiempo ms corto de recuperacin.  Menor riesgo de remocin del tero (histerectoma).  Menor riesgo de que la placenta cubra parcial o completamente la abertura del tero (placenta previa) en embarazos futuros.  Menos riesgos en el trabajo de parto y el parto futuros. RIESGOS  Ruptura del tero. Esto ocurre en menos del 1% de los partos vaginales. El riesgo de que eso suceda es mayor si: ? Se toman medidas para iniciar el proceso del trabajo de parto (inducir el parto) o estimular o intensificar las contracciones (aumentar el trabajo de parto). ? Se usan medicamentos para ablandar (madurar) el cuello del tero.  Es necesario extraer el tero (histerectoma) si se rompe. No debe llevarse a cabo si:  La cesrea previa se realiz con una incisin vertical (clsica) o con forma de T, o usted no sabe cul de ellas le han practicado.  Ha sufrido ruptura del tero.  Ha tenido ciertos tipos de ciruga en el tero, como la extirpacin de fibromas uterinos. Pregntele a su mdico sobre otros tipos de cirugas que le impiden tener un parto vaginal.  Tiene ciertos problemas mdicos o relacionados con el parto (obsttricos).  El beb est en problemas.    Tuvo dos cesreas previas y ningn parto vaginal. OTRAS COSAS QUE DEBE SABER:  La anestesia peridural es segura.  Es seguro dar vuelta al beb si se encuentra de nalgas (intentar una versin ceflica externa).  Es seguro intentarlo en caso de mellizos.  El parto vaginal puede no ser apropiado si el beb pesa 8,8lb (4kg) o ms. Sin embargo,  las predicciones de peso no son siempre exactas y no deben ser lo nico a tenerse en cuenta para decidir si el parto vaginal es lo indicado para usted.  Hay aumento en el porcentaje de fracasos si el intervalo entre la cesrea y el parto vaginal es de menos de 19 meses.  Su mdico puede aconsejarle no tener un parto vaginal si tiene preeclampsia (hipertensin, protena en la orina e hinchazn en la cara y las extremidades).  El parto vaginal suele ser exitoso si ya tuvo un parto vaginal previamente.  Tambin suele ser exitoso cuando el trabajo de parto comienza espontneamente antes de la fecha.  El parto vaginal despus de una cesrea es similar a un parto espontneo vaginal normal. Esta informacin no tiene como fin reemplazar el consejo del mdico. Asegrese de hacerle al mdico cualquier pregunta que tenga. Document Released: 10/13/2007 Document Revised: 02/14/2013 Document Reviewed: 11/23/2012 Elsevier Interactive Patient Education  2018 [employer]  

## 2016-12-09 NOTE — Progress Notes (Signed)
   PRENATAL VISIT NOTE  Subjective:  Alyssa Wallace is a 31 y.o. [phone] at [redacted]w[redacted]d being seen today for ongoing prenatal care.  She is currently monitored for the following issues for this low-risk pregnancy and has Supervision of high-risk pregnancy; Previous cesarean section complicating pregnancy; Previous preterm delivery x 2, antepartum; Language barrier; Short cervix with cervical cerclage, antepartum; and GBS (group B Streptococcus carrier), +RV culture, currently pregnant on her problem list.  Patient reports occasional sharp RUQ pain.  Contractions: Not present. Vag. Bleeding: None.  Movement: Present. Denies leaking of fluid.   The following portions of the patient's history were reviewed and updated as appropriate: allergies, current medications, past family history, past medical history, past social history, past surgical history and problem list. Problem list updated.  Objective:   Vitals:   12/09/16 1018  BP: 102/71  Pulse: 79  Weight: 136 lb 12.8 oz (62.1 kg)    Fetal Status: Fetal Heart Rate (bpm): 140 Fundal Height: 36 cm Movement: Present     General:  Alert, oriented and cooperative. Patient is in no acute distress.  Skin: Skin is warm and dry. No rash noted.   Cardiovascular: Normal heart rate noted  Respiratory: Normal respiratory effort, no problems with respiration noted  Abdomen: Soft, gravid, appropriate for gestational age.  Pain/Pressure: Present     Pelvic: Cervical exam deferred        Extremities: Normal range of motion.  Edema: Trace  Mental Status:  Normal mood and affect. Normal behavior. Normal judgment and thought content.   Assessment and Plan:  Pregnancy: [id number] at [redacted]w[redacted]d  There are no diagnoses linked to this encounter. Term labor symptoms and general obstetric precautions including but not limited to vaginal bleeding, contractions, leaking of fluid and fetal movement were reviewed in detail with the patient. Please refer to After Visit  Summary for other counseling recommendations.  Return in about 1 week (around 12/16/2016).   Emeterio Reeve, MD

## 2016-12-13 ENCOUNTER — Encounter (HOSPITAL_COMMUNITY): Payer: Self-pay | Admitting: *Deleted

## 2016-12-13 ENCOUNTER — Inpatient Hospital Stay (HOSPITAL_COMMUNITY): Payer: Medicaid Other | Admitting: Anesthesiology

## 2016-12-13 ENCOUNTER — Inpatient Hospital Stay (HOSPITAL_COMMUNITY)
Admission: AD | Admit: 2016-12-13 | Discharge: 2016-12-15 | DRG: 775 | Disposition: A | Discharge status: Home / Self Care | Payer: Medicaid Other | Source: Ambulatory Visit | Attending: Obstetrics & Gynecology | Admitting: Obstetrics & Gynecology

## 2016-12-13 DIAGNOSIS — O4292 Full-term premature rupture of membranes, unspecified as to length of time between rupture and onset of labor: Secondary | ICD-10-CM | POA: Diagnosis present

## 2016-12-13 DIAGNOSIS — O34219 Maternal care for unspecified type scar from previous cesarean delivery: Secondary | ICD-10-CM | POA: Diagnosis not present

## 2016-12-13 DIAGNOSIS — O26873 Cervical shortening, third trimester: Secondary | ICD-10-CM | POA: Diagnosis present

## 2016-12-13 DIAGNOSIS — O34211 Maternal care for low transverse scar from previous cesarean delivery: Secondary | ICD-10-CM | POA: Diagnosis present

## 2016-12-13 DIAGNOSIS — O99824 Streptococcus B carrier state complicating childbirth: Secondary | ICD-10-CM | POA: Diagnosis present

## 2016-12-13 DIAGNOSIS — Z3A38 38 weeks gestation of pregnancy: Secondary | ICD-10-CM

## 2016-12-13 DIAGNOSIS — O4290 Premature rupture of membranes, unspecified as to length of time between rupture and onset of labor, unspecified weeks of gestation: Secondary | ICD-10-CM | POA: Insufficient documentation

## 2016-12-13 LAB — CBC
HEMATOCRIT: 38.9 % (ref 36.0–46.0)
HEMOGLOBIN: 13.8 g/dL (ref 12.0–15.0)
MCH: 33.9 pg (ref 26.0–34.0)
MCHC: 35.5 g/dL (ref 30.0–36.0)
MCV: 95.6 fL (ref 78.0–100.0)
Platelets: 221 10*3/uL (ref 150–400)
RBC: 4.07 MIL/uL (ref 3.87–5.11)
RDW: 14 % (ref 11.5–15.5)
WBC: 7.4 10*3/uL (ref 4.0–10.5)

## 2016-12-13 LAB — TYPE AND SCREEN
ABO/RH(D): O POS
Antibody Screen: NEGATIVE

## 2016-12-13 LAB — POCT FERN TEST: POCT Fern Test: POSITIVE

## 2016-12-13 MED ORDER — OXYCODONE-ACETAMINOPHEN 5-325 MG PO TABS: 2.0000 | ORAL_TABLET | ORAL | Status: DC | PRN

## 2016-12-13 MED ORDER — LIDOCAINE HCL (PF) 1 % IJ SOLN
INTRAMUSCULAR | Status: DC | PRN
  Administered 2016-12-13 (×2): 4 mL

## 2016-12-13 MED ORDER — ACETAMINOPHEN 325 MG PO TABS: 650.0000 mg | ORAL_TABLET | ORAL | Status: DC | PRN

## 2016-12-13 MED ORDER — LACTATED RINGERS IV SOLN
INTRAVENOUS | Status: DC
  Administered 2016-12-13 (×3): via INTRAVENOUS

## 2016-12-13 MED ORDER — PENICILLIN G POTASSIUM 5000000 UNITS IJ SOLR
5.0000 10*6.[IU] | Freq: Once | INTRAVENOUS | Status: AC
  Administered 2016-12-13: 5 10*6.[IU] via INTRAVENOUS
  Filled 2016-12-13: qty 5

## 2016-12-13 MED ORDER — DIPHENHYDRAMINE HCL 50 MG/ML IJ SOLN: 12.5000 mg | INTRAMUSCULAR | Status: DC | PRN

## 2016-12-13 MED ORDER — EPHEDRINE 5 MG/ML INJ: 10.0000 mg | INTRAVENOUS | Status: DC | PRN

## 2016-12-13 MED ORDER — PHENYLEPHRINE 40 MCG/ML (10ML) SYRINGE FOR IV PUSH (FOR BLOOD PRESSURE SUPPORT)
80.0000 ug | PREFILLED_SYRINGE | INTRAVENOUS | Status: AC | PRN
  Administered 2016-12-13 (×3): 80 ug via INTRAVENOUS

## 2016-12-13 MED ORDER — PENICILLIN G POT IN DEXTROSE 60000 UNIT/ML IV SOLN
3.0000 10*6.[IU] | INTRAVENOUS | Status: DC
  Filled 2016-12-13 (×5): qty 50

## 2016-12-13 MED ORDER — LACTATED RINGERS IV SOLN: 500.0000 mL | Freq: Once | INTRAVENOUS | Status: DC

## 2016-12-13 MED ORDER — ONDANSETRON HCL 4 MG/2ML IJ SOLN: 4.0000 mg | Freq: Four times a day (QID) | INTRAMUSCULAR | Status: DC | PRN

## 2016-12-13 MED ORDER — SOD CITRATE-CITRIC ACID 500-334 MG/5ML PO SOLN: 30.0000 mL | ORAL | Status: DC | PRN

## 2016-12-13 MED ORDER — FENTANYL CITRATE (PF) 100 MCG/2ML IJ SOLN
INTRAMUSCULAR | Status: AC
  Administered 2016-12-13: 100 ug
  Filled 2016-12-13: qty 2

## 2016-12-13 MED ORDER — PHENYLEPHRINE 40 MCG/ML (10ML) SYRINGE FOR IV PUSH (FOR BLOOD PRESSURE SUPPORT)
80.0000 ug | PREFILLED_SYRINGE | INTRAVENOUS | Status: DC | PRN
  Filled 2016-12-13: qty 10

## 2016-12-13 MED ORDER — FLEET ENEMA 7-19 GM/118ML RE ENEM: 1.0000 | ENEMA | RECTAL | Status: DC | PRN

## 2016-12-13 MED ORDER — LACTATED RINGERS IV SOLN: 500.0000 mL | INTRAVENOUS | Status: DC | PRN

## 2016-12-13 MED ORDER — OXYTOCIN 40 UNITS IN LACTATED RINGERS INFUSION - SIMPLE MED
2.5000 [IU]/h | INTRAVENOUS | Status: DC
  Administered 2016-12-13: 2.5 [IU]/h via INTRAVENOUS

## 2016-12-13 MED ORDER — LIDOCAINE HCL (PF) 1 % IJ SOLN
30.0000 mL | INTRAMUSCULAR | Status: DC | PRN
  Administered 2016-12-13: 30 mL via SUBCUTANEOUS
  Filled 2016-12-13: qty 30

## 2016-12-13 MED ORDER — FENTANYL 2.5 MCG/ML BUPIVACAINE 1/10 % EPIDURAL INFUSION (WH - ANES)
14.0000 mL/h | INTRAMUSCULAR | Status: DC | PRN
  Administered 2016-12-13: 14 mL/h via EPIDURAL
  Filled 2016-12-13: qty 100

## 2016-12-13 MED ORDER — OXYCODONE-ACETAMINOPHEN 5-325 MG PO TABS: 1.0000 | ORAL_TABLET | ORAL | Status: DC | PRN

## 2016-12-13 MED ORDER — OXYTOCIN 40 UNITS IN LACTATED RINGERS INFUSION - SIMPLE MED
1.0000 m[IU]/min | INTRAVENOUS | Status: DC
  Administered 2016-12-13: 1 m[IU]/min via INTRAVENOUS
  Filled 2016-12-13: qty 1000

## 2016-12-13 MED ORDER — OXYTOCIN BOLUS FROM INFUSION
500.0000 mL | Freq: Once | INTRAVENOUS | Status: AC
  Administered 2016-12-13: 500 mL via INTRAVENOUS

## 2016-12-13 MED ORDER — TERBUTALINE SULFATE 1 MG/ML IJ SOLN: 0.2500 mg | Freq: Once | INTRAMUSCULAR | Status: DC | PRN

## 2016-12-13 MED ORDER — FENTANYL CITRATE (PF) 100 MCG/2ML IJ SOLN
100.0000 ug | INTRAMUSCULAR | Status: DC | PRN
  Administered 2016-12-13: 100 ug via INTRAVENOUS
  Filled 2016-12-13: qty 2

## 2016-12-13 NOTE — H&P (Signed)
LABOR ADMISSION HISTORY AND PHYSICAL  Alyssa Wallace is a 30 y.o. female [phone] with IUP at [redacted]w[redacted]d by LMP presenting for SROM at 0800 on 08/06, TOLAC- previous section for breech presentation. She reports +FMs, No LOF, no VB, no blurry vision, no headaches, no peripheral edema, and no RUQ pain.  She plans on breast feeding. She requests condoms for birth control.  Dating: By LMP --->  Estimated Date of Delivery: 12/27/16  Sono:   @[redacted]w[redacted]d , CWD, normal anatomy, cephalic presentation, 9563O, 65% EFW  Prenatal History/Complications:  Past Medical History: Past Medical History:  Diagnosis Date  . Preterm labor     Past Surgical History: Past Surgical History:  Procedure Laterality Date  . CERVICAL CERCLAGE N/A 07/30/2016   Procedure: CERCLAGE CERVICAL;  Surgeon: Woodroe Mode, MD;  Location: [facility];  Service: Gynecology;  Laterality: N/A;  . CESAREAN SECTION  04/05/2012   Procedure: CESAREAN SECTION;  Surgeon: Woodroe Mode, MD;  Location: [facility];  Service: Obstetrics;  Laterality: N/A;  Primary Cesarean Section Delivery Girl @ [phone], Apgars 5/8    Obstetrical History: OB History    Gravida Para Term Preterm AB Living   4 2 0 2 1 2    SAB TAB Ectopic Multiple Live Births   1 0 0 0 2      Social History: Social History   Social History  . Marital status: Single    Spouse name: N/A  . Number of children: N/A  . Years of education: N/A   Social History Main Topics  . Smoking status: Never Smoker  . Smokeless tobacco: Never Used  . Alcohol use No  . Drug use: No  . Sexual activity: Not Currently   Other Topics Concern  . None   Social History Narrative  . None    Family History: Family History  Problem Relation Age of Onset  . Diabetes Mother   . Diabetes Father   . Diabetes Sister     Allergies: No Known Allergies  Prescriptions Prior to Admission  Medication Sig Dispense Refill Last Dose  . acetaminophen (TYLENOL) 325 MG tablet Take 650 mg by  mouth every 6 (six) hours as needed for mild pain, moderate pain, fever or headache.    Not Taking  . docusate sodium (COLACE) 100 MG capsule Take 1 capsule (100 mg total) by mouth daily. (Patient not taking: Reported on 11/18/2016) 10 capsule 0 Not Taking  . Prenatal Vit-Fe Fumarate-FA (PRENATAL/FOLIC ACID) TABS Take 1 tablet by mouth daily. 30 each 11 Taking  . terconazole (TERAZOL 7) 0.4 % vaginal cream Place 1 applicator vaginally at bedtime. Use for seven days (Patient not taking: Reported on 11/18/2016) 45 g 0 Not Taking     Review of Systems   All systems reviewed and negative except as stated in HPI  Blood pressure 117/74, pulse 75, temperature 98.6 F (37 C), temperature source Oral, resp. rate 16, height 5' (1.524 m), weight 62.1 kg (137 lb), last menstrual period 03/22/2016, unknown if currently breastfeeding. General appearance: alert, cooperative and no distress Lungs: normal work of breathing Extremities: Homans sign is negative, no sign of DVT Presentation: cephalic Fetal monitoringBaseline: 145 bpm, Variability: Good {> 6 bpm), Accelerations: Reactive and Decelerations: Absent Uterine activityFrequency: Every 1-3 minutes Dilation: 1 Effacement (%): 50 Station: -2 Exam by:: Daneil Dan, rnc    Prenatal labs: ABO, Rh: --/--/O POS (06/08 1759) Antibody: NEG (06/08 1759) Rubella: Immune RPR: Non Reactive (06/01 0825)  HBsAg: Negative (01/25 0000)  HIV:  Non-reactive (01/25 0000)  GBS: Positive (07/26 0000)  GTT: Fasting- 80, 1 hr- 177, 2 hr- 97  Prenatal Transfer Tool  Maternal Diabetes: No Genetic Screening: Normal Maternal Ultrasounds/Referrals: Normal Fetal Ultrasounds or other Referrals:  None Maternal Substance Abuse:  No Significant Maternal Medications:  None Significant Maternal Lab Results: Lab values include: Group B Strep positive  Results for orders placed or performed during the hospital encounter of 12/13/16 (from the past 24 hour(s))  POCT fern  test   Collection Time: 12/13/16  3:06 PM  Result Value Ref Range   POCT Fern Test Positive = ruptured amniotic membanes   CBC   Collection Time: 12/13/16  3:25 PM  Result Value Ref Range   WBC 7.4 4.0 - 10.5 K/uL   RBC 4.07 3.87 - 5.11 MIL/uL   Hemoglobin 13.8 12.0 - 15.0 g/dL   HCT 38.9 36.0 - 46.0 %   MCV 95.6 78.0 - 100.0 fL   MCH 33.9 26.0 - 34.0 pg   MCHC 35.5 30.0 - 36.0 g/dL   RDW 14.0 11.5 - 15.5 %   Platelets 221 150 - 400 K/uL    Patient Active Problem List   Diagnosis Date Noted  . Normal labor 12/13/2016  . GBS (group B Streptococcus carrier), +RV culture, currently pregnant 12/06/2016  . Short cervix with cervical cerclage, antepartum 10/21/2016  . Supervision of high-risk pregnancy 06/14/2016  . Previous cesarean section complicating pregnancy 95/32/0233  . Previous preterm delivery x 2, antepartum 06/14/2016  . Language barrier 06/14/2016    Assessment: Alyssa Wallace is a 31 y.o. [id number] at [redacted]w[redacted]d here for SROM at 0800 on 08/06, TOLAC- previous C-section for breech presentation  #Labor: Induction protocol- Placed foley bulb #Pain: Epidural upon request  #FWB: Cat 1 #ID: GBS positive- PCN when in active labor #MOF: breast #MOC: condoms #Circ: N/A  Martinique Shirley, DO Family Medicine Resident PGY-1  12/13/2016, 4:22 PM   OB FELLOW HISTORY AND PHYSICAL ATTESTATION  I have seen and examined this patient; I agree with above documentation in the resident's note.   SROM at 0800. No in labor. Will start induction. Pt would like TOLAC. VBAC consent signed 09/18/16.  Gailen Shelter, MD OB Fellow 12/13/2016, 4:54 PM

## 2016-12-13 NOTE — Anesthesia Preprocedure Evaluation (Signed)
Anesthesia Evaluation  Patient identified by MRN, date of birth, ID band Patient awake    Reviewed: Allergy & Precautions, H&P , NPO status , Patient's Chart, lab work & pertinent test results  Airway Mallampati: II       Dental no notable dental hx.    Pulmonary neg pulmonary ROS,    Pulmonary exam normal breath sounds clear to auscultation       Cardiovascular Exercise Tolerance: Good negative cardio ROS   Rhythm:regular Rate:Normal     Neuro/Psych negative neurological ROS  negative psych ROS   GI/Hepatic negative GI ROS, Neg liver ROS,   Endo/Other  negative endocrine ROS  Renal/GU negative Renal ROS  negative genitourinary   Musculoskeletal   Abdominal Normal abdominal exam  (+)   Peds  Hematology negative hematology ROS (+)   Anesthesia Other Findings   Reproductive/Obstetrics (+) Pregnancy                             Anesthesia Physical  Anesthesia Plan  ASA: II  Anesthesia Plan: Epidural   Post-op Pain Management:    Induction:   PONV Risk Score and Plan:   Airway Management Planned:   Additional Equipment:   Intra-op Plan:   Post-operative Plan:   Informed Consent: I have reviewed the patients History and Physical, chart, labs and discussed the procedure including the risks, benefits and alternatives for the proposed anesthesia with the patient or authorized representative who has indicated his/her understanding and acceptance.     Plan Discussed with:   Anesthesia Plan Comments:         Anesthesia Quick Evaluation

## 2016-12-13 NOTE — Anesthesia Pain Management Evaluation Note (Signed)
  CRNA Pain Management Visit Note  Patient: Alyssa Wallace, 31 y.o., female  "Hello I am a member of the anesthesia team at [facility]. We have an anesthesia team available at all times to provide care throughout the hospital, including epidural management and anesthesia for C-section. I don't know your plan for the delivery whether it a natural birth, water birth, IV sedation, nitrous supplementation, doula or epidural, but we want to meet your pain goals."   1.Was your pain managed to your expectations on prior hospitalizations?   Yes   2.What is your expectation for pain management during this hospitalization?     Epidural  3.How can we help you reach that goal? epidural  Record the patient's initial score and the patient's pain goal.   Pain: 2  Pain Goal: 4 [facility] wants you to be able to say your pain was always managed very well.  [name] 12/13/2016

## 2016-12-13 NOTE — Anesthesia Procedure Notes (Signed)
Epidural Patient location during procedure: OB  Staffing Anesthesiologist: Nolon Nations Performed: anesthesiologist   Preanesthetic Checklist Completed: patient identified, pre-op evaluation, timeout performed, IV checked, risks and benefits discussed and monitors and equipment checked  Epidural Patient position: sitting Prep: site prepped and draped and DuraPrep Patient monitoring: heart rate, continuous pulse ox and blood pressure Approach: midline Location: L3-L4 Injection technique: LOR air and LOR saline  Needle:  Needle type: Tuohy  Needle gauge: 17 G Needle length: 9 cm Needle insertion depth: 5 cm Catheter type: closed end flexible Catheter size: 19 Gauge Catheter at skin depth: 11 and 11 cm Test dose: negative  Assessment Sensory level: T8 Events: blood not aspirated, injection not painful, no injection resistance, negative IV test and no paresthesia  Additional Notes Reason for block:procedure for pain

## 2016-12-13 NOTE — MAU Note (Signed)
Pt C/O contractions since this morning @ 0700, is leaking fluid with each contraction.  Has been having discharge since last Thursday, but leaking today is more watery.   Denies bleeding.

## 2016-12-13 NOTE — Progress Notes (Signed)
Alyssa Wallace is a 31 y.o. [id number] at [redacted]w[redacted]d by LMP admitted for PROM  Subjective:  Comfortable with epidural. No questions or complaints.  Objective: BP 107/68   Pulse 79   Temp 98.8 F (37.1 C) (Oral)   Resp 16   Ht 5' (1.524 m)   Wt 137 lb (62.1 kg)   LMP 03/22/2016   SpO2 99%   BMI 26.76 kg/m  No intake/output data recorded. No intake/output data recorded.  FHT:  FHR: 145 bpm, variability: moderate,  accelerations:  Present,  decelerations:  Present variables  UC:   regular, every 3 minutes SVE:   Dilation: 4 Effacement (%): 90 Station: -1 Exam by:: Ria Comment Lima,RN  Foley bulb coming out of the vagina, but RN reports that it is "hung up on something". On SVE there is a small piece of cerclage string still present, and the tubing of the foley is stuck on this. Tubing cut, and pulled through the opening created by the cervical tissue and suture. Patient is now 4cm. Will continue to increase pitocin. D/W Dr. Ihor Dow, ok to leave suture in place at this time. At the time of delivery we can attempt to remove it.  Labs: Lab Results  Component Value Date   WBC 7.4 12/13/2016   HGB 13.8 12/13/2016   HCT 38.9 12/13/2016   MCV 95.6 12/13/2016   PLT 221 12/13/2016    Assessment / Plan: Induction of labor due to PROM,  progressing well on pitocin  Labor: Progressing normally Preeclampsia:  no signs or symptoms of toxicity Fetal Wellbeing:  Category II Pain Control:  Epidural I/D:  n/a Anticipated MOD:  NSVD  Marcille Buffy 12/13/2016, 9:10 PM

## 2016-12-14 ENCOUNTER — Encounter (HOSPITAL_COMMUNITY): Payer: Self-pay

## 2016-12-14 DIAGNOSIS — Z3A38 38 weeks gestation of pregnancy: Secondary | ICD-10-CM

## 2016-12-14 DIAGNOSIS — O99824 Streptococcus B carrier state complicating childbirth: Secondary | ICD-10-CM

## 2016-12-14 DIAGNOSIS — O34219 Maternal care for unspecified type scar from previous cesarean delivery: Secondary | ICD-10-CM

## 2016-12-14 LAB — RPR: RPR Ser Ql: NONREACTIVE

## 2016-12-14 MED ORDER — DIPHENHYDRAMINE HCL 25 MG PO CAPS: 25.0000 mg | ORAL_CAPSULE | Freq: Four times a day (QID) | ORAL | Status: DC | PRN

## 2016-12-14 MED ORDER — MEASLES, MUMPS & RUBELLA VAC ~~LOC~~ INJ
0.5000 mL | INJECTION | Freq: Once | SUBCUTANEOUS | Status: DC
  Filled 2016-12-14: qty 0.5

## 2016-12-14 MED ORDER — SODIUM CHLORIDE 0.9% FLUSH: 3.0000 mL | INTRAVENOUS | Status: DC | PRN

## 2016-12-14 MED ORDER — SODIUM CHLORIDE 0.9% FLUSH: 3.0000 mL | Freq: Two times a day (BID) | INTRAVENOUS | Status: DC

## 2016-12-14 MED ORDER — PRENATAL MULTIVITAMIN CH
1.0000 | ORAL_TABLET | Freq: Every day | ORAL | Status: DC
  Administered 2016-12-14: 1 via ORAL
  Filled 2016-12-14: qty 1

## 2016-12-14 MED ORDER — COCONUT OIL OIL: 1.0000 "application " | TOPICAL_OIL | Status: DC | PRN

## 2016-12-14 MED ORDER — DIBUCAINE 1 % RE OINT: 1.0000 "application " | TOPICAL_OINTMENT | RECTAL | Status: DC | PRN

## 2016-12-14 MED ORDER — OXYTOCIN 40 UNITS IN LACTATED RINGERS INFUSION - SIMPLE MED: 2.5000 [IU]/h | INTRAVENOUS | Status: DC | PRN

## 2016-12-14 MED ORDER — SODIUM CHLORIDE 0.9 % IV SOLN: 250.0000 mL | INTRAVENOUS | Status: DC | PRN

## 2016-12-14 MED ORDER — TETANUS-DIPHTH-ACELL PERTUSSIS 5-2.5-18.5 LF-MCG/0.5 IM SUSP: 0.5000 mL | Freq: Once | INTRAMUSCULAR | Status: DC

## 2016-12-14 MED ORDER — WITCH HAZEL-GLYCERIN EX PADS: 1.0000 "application " | MEDICATED_PAD | CUTANEOUS | Status: DC | PRN

## 2016-12-14 MED ORDER — MAGNESIUM HYDROXIDE 400 MG/5ML PO SUSP: 30.0000 mL | ORAL | Status: DC | PRN

## 2016-12-14 MED ORDER — ONDANSETRON HCL 4 MG PO TABS: 4.0000 mg | ORAL_TABLET | ORAL | Status: DC | PRN

## 2016-12-14 MED ORDER — BENZOCAINE-MENTHOL 20-0.5 % EX AERO
1.0000 "application " | INHALATION_SPRAY | CUTANEOUS | Status: DC | PRN
  Administered 2016-12-14: 1 via TOPICAL
  Filled 2016-12-14: qty 56

## 2016-12-14 MED ORDER — ACETAMINOPHEN 325 MG PO TABS
650.0000 mg | ORAL_TABLET | ORAL | Status: DC | PRN
  Administered 2016-12-14 (×2): 650 mg via ORAL
  Filled 2016-12-14 (×2): qty 2

## 2016-12-14 MED ORDER — OXYCODONE-ACETAMINOPHEN 5-325 MG PO TABS: 1.0000 | ORAL_TABLET | ORAL | Status: DC | PRN

## 2016-12-14 MED ORDER — SENNOSIDES-DOCUSATE SODIUM 8.6-50 MG PO TABS
2.0000 | ORAL_TABLET | ORAL | Status: DC
  Administered 2016-12-14: 2 via ORAL
  Filled 2016-12-14 (×2): qty 2

## 2016-12-14 MED ORDER — ONDANSETRON HCL 4 MG/2ML IJ SOLN: 4.0000 mg | INTRAMUSCULAR | Status: DC | PRN

## 2016-12-14 MED ORDER — ZOLPIDEM TARTRATE 5 MG PO TABS: 5.0000 mg | ORAL_TABLET | Freq: Every evening | ORAL | Status: DC | PRN

## 2016-12-14 MED ORDER — OXYCODONE-ACETAMINOPHEN 5-325 MG PO TABS: 2.0000 | ORAL_TABLET | ORAL | Status: DC | PRN

## 2016-12-14 MED ORDER — DOCUSATE SODIUM 100 MG PO CAPS
100.0000 mg | ORAL_CAPSULE | Freq: Two times a day (BID) | ORAL | Status: DC
  Administered 2016-12-14: 100 mg via ORAL
  Filled 2016-12-14: qty 1

## 2016-12-14 MED ORDER — IBUPROFEN 600 MG PO TABS
600.0000 mg | ORAL_TABLET | Freq: Four times a day (QID) | ORAL | Status: DC
  Administered 2016-12-14 – 2016-12-15 (×5): 600 mg via ORAL
  Filled 2016-12-14 (×5): qty 1

## 2016-12-14 MED ORDER — SIMETHICONE 80 MG PO CHEW: 80.0000 mg | CHEWABLE_TABLET | ORAL | Status: DC | PRN

## 2016-12-14 NOTE — Progress Notes (Signed)
I assisted Deven,RN with assessment and information about Post Partum Depression. Hardy Interpreter.

## 2016-12-14 NOTE — Discharge Instructions (Signed)
Instrucciones para la mamá sobre los cuidados en el hogar °(Home Care Instructions for Mom) °ACTIVIDAD °· Reanude sus actividades regulares de forma gradual. °· Descanse. Tome siestas cuando el bebé duerme. °· No levante objetos que pesen más de 10 libras (4,5 kg) hasta que el médico se lo autorice. °· Evite las actividades que demandan mucho esfuerzo y energía (que son extenuantes) hasta que el médico se lo autorice. Caminar a un ritmo tranquilo a moderado siempre es más seguro. °· Si tuvo un parto por cesárea: °? No pase la aspiradora, suba escaleras o conduzca un vehículo durante 4 o 6 semanas. °? Pídale a alguien que le brinde ayuda con las tareas domésticas hasta que pueda realizarlas por su cuenta. °? Haga ejercicios como se lo haya indicado el médico, si corresponde. ° °HEMORRAGIA VAGINAL °Probablemente continúe sangrando durante 4 o 6 semanas después del parto. Generalmente, la cantidad de sangre disminuye y el color se hace más claro con el transcurso del tiempo. Sin embargo, si usted está demasiado activa, el color de la sangre puede ser rojo brillante. Si necesita cambiarse la compresa higiénica en menos de una hora o tiene coágulos grandes: °· Permanezca acostada. °· Eleve los pies. °· Coloque compresas frías en la zona inferior del abdomen. °· Haga reposo. °· Comuníquese con su médico. °Si está amamantando, podría volver a tener su período entre las 8 semanas después del parto y el momento en que deje de amamantar. Si no está amamantando, volverá a tener su período 6 u 8 semanas después del parto. °CUIDADOS PERINEALES °La zona perineal o perineo, es la parte del cuerpo que se encuentra entre los muslos. Después del parto, esta zona necesita un cuidado especial. Siga las siguientes indicaciones como se lo haya indicado su médico. °· Tome baños de inmersión durante 15 o 20 minutos. °· Utilice apósitos o aerosoles analgésicos y cremas como se lo hayan indicado. °· No utilice tampones ni se haga duchas  vaginales hasta que el sangrado vaginal se haya detenido. °· Cada vez que vaya al baño: °? Use una botella perineal. °? Cámbiese el apósito. °? Use papel tisú en lugar de papel higiénico hasta que se cure la sutura. °· Haga ejercicios de Kegel todos los días. Los ejercicios Kegel ayudan a mantener los músculos que sostienen la vagina, la vejiga y los intestinos. Estos ejercicios se pueden realizar mientras está parada, sentada o acostada. Para hacer los ejercicios de Kegel: °? Tense los músculos del estómago y los que rodean el canal de parto. °? Mantenga esta posición durante unos segundos. °? Relájese. °? Repita hasta hacerlos 5 veces seguidas. °· Para evitar las hemorroides o que estas empeoren: °? Beba suficiente líquido para mantener la orina clara o de color amarillo pálido. °? Evite hacer fuerza al defecar. °? Tome los medicamentos y laxantes de venta libre como se lo haya indicado el médico. °CUIDADO DE LAS MAMAS °· Use un buen sostén. °· Evite tomar analgésicos de venta libre para las molestias de los pechos. °· Aplique hielo en los pechos para aliviar las molestias tanto como sea necesario: °? Ponga el hielo en una bolsa plástica. °? Coloque una toalla entre la piel y la bolsa de hielo. °? Aplique el hielo durante 20, o como se lo haya indicado el médico. ° °NUTRICIÓN °· Mantenga una dieta bien balanceada. °· No intente perder de peso rápidamente reduciendo el consumo de calorías. °· Tome sus vitaminas prenatales hasta el control de postparto o hasta que su médico se lo indique. ° °DEPRESIÓN POSTPARTO °  Puede sentir deseos de llorar sin motivo aparente y verse incapaz de enfrentarse a todos los cambios que implica tener un bebé. Este estado de ánimo se llama depresión postparto. La depresión postparto ocurre porque sus niveles hormonales sufren cambios después del parto. Si usted tiene depresión postparto, busque contención por parte de su pareja, sus amigos y su familia. Si la depresión no desaparece por  sí sola después de algunas semanas, concurra a su médico. °AUTOEXAMEN DE MAMAS °Realícese autoexámenes en el mismo momento cada mes. Si está amamantando, el mejor momento de controlar sus mamas es después de alimentar al bebé, cuando los pechos no están tan llenos. Si está amamantando y su período ya comenzó, controle sus mamas el día 5, 6 o 7 de su período. °Informe a su médico de cualquier protuberancia, bulto o secreción. Si está amamantando, las mamas normalmente tienen bultos. Esto es transitorio y no es un riesgo para la salud. °INTIMIDAD Y SEXUALIDAD °Debe evitar las relaciones sexuales durante al menos 3 o 4 semanas después del parto o hasta que el flujo de color rojo amarronado haya desaparecido completamente. Si no desea quedar embarazada nuevamente, use algún método anticonceptivo. Después del parto, puede quedar embarazada incluso si no ha tenido todavía el período. °SOLICITE ATENCIÓN MÉDICA SI: °· Se siente incapaz de controlar los cambios que implica tener un hijo y esos sentimientos no desaparecen después de algunas semanas. °· Detecta una protuberancia, bulto o secreción en sus mamas. ° °SOLICITE ATENCIÓN MÉDICA DE INMEDIATO SI: °· Debe cambiarse la compresa higiénica en 1 hora o menos. °· Tiene los siguientes síntomas: °? Dolor intenso o calambres en la parte inferior del abdomen. °? Una secreción vaginal con mal olor. °? Fiebre que no se alivia con los medicamentos. °? Una zona de la mama se pone roja y le causa dolor, y además usted tiene fiebre. °? Una pantorrilla enrojecida y con dolor. °? Repentino e intenso dolor en el pecho. °? Falta de aire. °? Micción dolorosa o con sangre. °? Problemas visuales. °· Vómitos durante 12 horas o más. °· Dolor de cabeza intenso. °· Tiene pensamientos serios acerca de lastimarse a usted misma o dañar al niño o a otra persona. ° °Esta información no tiene como fin reemplazar el consejo del médico. Asegúrese de hacerle al médico cualquier pregunta que  tenga. °Document Released: 04/26/2005 Document Revised: 08/18/2015 Document Reviewed: 10/28/2014 °Elsevier Interactive Patient Education © 2017 Elsevier Inc. ° °

## 2016-12-14 NOTE — Lactation Note (Signed)
This note was copied from a baby's chart. Lactation Consultation Note  Patient Name: Girl Brehanna Deveny OEVOJ'J Date: 12/14/2016 Reason for consult: Initial assessment;Other (Comment)  Spanish interpreter Charlesetta Garibaldi [phone] , and 1/2 way through cut off called back Spanish interpreter Celina [phone]  Randel Books is 46 hours old and has been to the breast , and supplemented per mom's choice per MBU RN - Darleene Cleaver EFird per  Earlier interpreter  Has been to the breast during the night and early this am.  While LC present baby walking up intermittently , [place] asked mom is she desired the [facility] to assist with latch to see why she is getting sore. LC assessed breast tissue with moms permission and noted no breakdown , but sensitive tissue when showing mom how to hand express. Noted several drops of colostrum. Semi compressible areolas . LC instructed mom on the steps of latching and encouraged to decrease potential for soreness increasing. Mom doesn't have a bra with her for shells.  LC reviewed steps and showed mom the breast massage, hand express, and pre pump, noted the nipple areola complex to be more elastic and the baby was able to latch with depth and per mom was comfortable. Baby suckled 8-9 times with few swallows and released and fell asleep.  LC stressed the importance of depth when latched and prevention of sore nipples.  LC also mentioned it was important to work with the baby to open wide before latching. Mother informed of post-discharge support and given phone number to the lactation department, including services for phone call assistance; out-patient appointments; and breastfeeding support group. List of other breastfeeding resources in the community given in the handout. Encouraged mother to call for problems or concerns related to breastfeeding.   Maternal Data Has patient been taught Hand Expression?: Yes Does the patient have breastfeeding experience prior to this delivery?:  Yes  Feeding Feeding Type: Bottle Fed - Formula Length of feed: 7 min (per mom 5-7 mins )  LATCH Score Latch: Repeated attempts needed to sustain latch, nipple held in mouth throughout feeding, stimulation needed to elicit sucking reflex.  Audible Swallowing: A few with stimulation  Type of Nipple: Everted at rest and after stimulation  Comfort (Breast/Nipple): Soft / non-tender  Hold (Positioning): Assistance needed to correctly position infant at breast and maintain latch.  LATCH Score: 7  Interventions Interventions: Breast feeding basics reviewed  Lactation Tools Discussed/Used WIC Program: Yes   Consult Status Consult Status: Follow-up Date: 12/14/16 Follow-up type: In-patient    [place] 12/14/2016, 11:47 AM

## 2016-12-14 NOTE — Anesthesia Postprocedure Evaluation (Signed)
Anesthesia Post Note  Patient: Alyssa Wallace  Procedure(s) Performed: * No procedures listed *     Patient location during evaluation: Mother Baby Anesthesia Type: Epidural Level of consciousness: awake, awake and alert and oriented Pain management: pain level controlled Vital Signs Assessment: post-procedure vital signs reviewed and stable Respiratory status: spontaneous breathing, nonlabored ventilation and respiratory function stable Cardiovascular status: stable Postop Assessment: no headache, no backache, patient able to bend at knees, no signs of nausea or vomiting and adequate PO intake Anesthetic complications: no    Last Vitals:  Vitals:   12/14/16 0235 12/14/16 0528  BP: (!) 95/57 (!) 91/53  Pulse: 86 79  Resp: 18 18  Temp: 36.9 C 36.8 C    Last Pain:  Vitals:   12/14/16 0528  TempSrc: Oral  PainSc:    Pain Goal: Patients Stated Pain Goal: 0 (12/13/16 1421)               Tarance Balan

## 2016-12-14 NOTE — Progress Notes (Signed)
Post Partum Day 1  Subjective:  Alyssa Wallace is a 31 y.o. [id number] [redacted]w[redacted]d s/p SVD.  No acute events overnight.  Pt denies problems with ambulating, voiding or po intake.  She denies nausea or vomiting.  Pain is moderately controlled.  She has not had flatus. She has had bowel movement.  Lochia Small.  Plan for birth control is condoms.  Method of Feeding: Breast  Objective: BP (!) 91/53 Comment: mom states bleeding needs to go to bathroom  Pulse 79   Temp 98.2 F (36.8 C) (Oral)   Resp 18   Ht 5' (1.524 m)   Wt 137 lb (62.1 kg)   LMP 03/22/2016   SpO2 99%   Breastfeeding? Unknown   BMI 26.76 kg/m   Physical Exam:  General: alert, cooperative and no distress Lochia:normal flow Chest: CTAB Heart: RRR no m/r/g Abdomen: +BS, soft, nontender, fundus firm at/below umbilicus Uterine Fundus: firm DVT Evaluation: No evidence of DVT seen on physical exam.   Recent Labs  12/13/16 1525  HGB 13.8  HCT 38.9    Assessment/Plan:  ASSESSMENT: Alyssa Wallace is a 31 y.o. [id number] [redacted]w[redacted]d ppd #1 s/p NSVD/VAVD/FAVD doing well.   Plan for discharge tomorrow and Breastfeeding   LOS: 1 day   Windell Moment 12/14/2016, 7:42 AM

## 2016-12-15 ENCOUNTER — Encounter: Payer: Self-pay | Admitting: Obstetrics and Gynecology

## 2016-12-15 ENCOUNTER — Encounter (HOSPITAL_COMMUNITY): Payer: Self-pay | Admitting: Advanced Practice Midwife

## 2016-12-15 MED ORDER — IBUPROFEN 600 MG PO TABS: 600.0000 mg | ORAL_TABLET | Freq: Four times a day (QID) | ORAL | 0 refills | Status: DC

## 2016-12-15 NOTE — Discharge Summary (Signed)
OB Discharge Summary     Patient Name: Alyssa Wallace DOB: Feb 14, 1986 MRN: [id number]  Date of admission: 12/13/2016 Delivering MD: Marcille Buffy D   Date of discharge: 12/15/2016  Admitting diagnosis: 88WKS LABOR  Intrauterine pregnancy: [redacted]w[redacted]d     Secondary diagnosis:  Active Problems:   PROM (premature rupture of membranes)  Additional problems:  Patient Active Problem List   Diagnosis Date Noted  . PROM (premature rupture of membranes) 12/13/2016  . GBS (group B Streptococcus carrier), +RV culture, currently pregnant 12/06/2016  . Short cervix with cervical cerclage, antepartum 10/21/2016  . Supervision of high-risk pregnancy 06/14/2016  . Previous cesarean section complicating pregnancy 51/70/0174  . Previous preterm delivery x 2, antepartum 06/14/2016  . Language barrier 06/14/2016        Discharge diagnosis: Term Pregnancy Delivered and VBAC                                                                                                Post partum procedures:none  Augmentation: Pitocin and Foley Balloon  Complications: None  Hospital course:  Onset of Labor With Vaginal Delivery     31 y.o. yo [id number] at [redacted]w[redacted]d was admitted in Latent Labor on 12/13/2016. She had PROM, and and so was induced with foley bulb placement followed by Pitocin. Patient had an uncomplicated labor course as follows:  Membrane Rupture Time/Date: 8:00 AM ,12/13/2016   Intrapartum Procedures: Episiotomy:                                           Lacerations:  2nd degree [3]  Patient had a delivery of a Viable infant. 12/13/2016  Information for the patient's newborn:  Gilberta Peeters, Girl Derya [[id number]]  Delivery Method: VBAC, Spontaneous (Filed from Delivery Summary)    Patient seen with in house interpreter in the room. Patient had cerclage this pregnancy with h/o PTD. Cervical laceration noted after delivery due to possible difficult cerclage removal. Will need follow up for this.  Pateint had an uncomplicated postpartum course. She is ambulating, tolerating a regular diet, passing flatus, and urinating well. Patient is discharged home in stable condition on 12/15/16.   Physical exam  Vitals:   12/14/16 0235 12/14/16 0528 12/14/16 1915 12/15/16 0500  BP: (!) 95/57 (!) 91/53 (!) 88/51 (!) 93/59  Pulse: 86 79 71 62  Resp: 18 18 20 18   Temp: 98.4 F (36.9 C) 98.2 F (36.8 C) 98.2 F (36.8 C) 97.7 F (36.5 C)  TempSrc: Oral Oral Oral Oral  SpO2:      Weight:      Height:       General: alert, cooperative and no distress Lochia: appropriate Uterine Fundus: firm Incision: N/A DVT Evaluation: No evidence of DVT seen on physical exam. Negative Homan's sign. No significant calf/ankle edema. Labs: Lab Results  Component Value Date   WBC 7.4 12/13/2016   HGB 13.8 12/13/2016   HCT 38.9 12/13/2016   MCV 95.6 12/13/2016   PLT 221 12/13/2016  CMP Latest Ref Rng & Units 05/12/2016  Glucose 65 - 99 mg/dL 89  BUN 7 - 25 mg/dL 9  Creatinine 0.50 - 1.10 mg/dL 0.52  Sodium 135 - 146 mmol/L 138  Potassium 3.5 - 5.3 mmol/L 3.8  Chloride 98 - 110 mmol/L 104  CO2 20 - 31 mmol/L 23  Calcium 8.6 - 10.2 mg/dL 8.9  Total Protein 6.0 - 8.3 g/dL -  Total Bilirubin 0.3 - 1.2 mg/dL -  Alkaline Phos 39 - 117 U/L -  AST 0 - 37 U/L -  ALT 0 - 35 U/L -    Discharge instruction: per After Visit Summary and "Baby and Me Booklet".  After visit meds:  Allergies as of 12/15/2016   No Known Allergies     Medication List    TAKE these medications   acetaminophen 500 MG tablet Commonly known as:  TYLENOL Take 1,000 mg by mouth every 6 (six) hours as needed for mild pain, moderate pain, fever or headache.   ibuprofen 600 MG tablet Commonly known as:  ADVIL,MOTRIN Take 1 tablet (600 mg total) by mouth every 6 (six) hours.   PRENATAL/FOLIC ACID Tabs Take 1 tablet by mouth daily. What changed:  when to take this       Diet: routine diet  Activity: Advance as  tolerated. Pelvic rest for 6 weeks.   Outpatient follow up:4-6 weeks Follow up Appt:No future appointments. Follow up Visit:No Follow-up on file.  Postpartum contraception: Condoms  Newborn Data: Live born female  Birth Weight: 6 lb 8.2 oz (2955 g) APGAR: 9, 10  Baby Feeding: Breast Disposition:home with mother   12/15/2016 [place] Shirley, DO  CNM attestation (visit with Spanish interpreter) I have seen and examined this patient and agree with above documentation in the resident's note.   Kendre Sires is a 31 y.o. [id number] s/p VBAC.   Pain is well controlled.  Plan for birth control is condoms.  Method of Feeding: breast  PE:  BP (!) 93/59 (BP Location: Left Arm)   Pulse 62   Temp 97.7 F (36.5 C) (Oral)   Resp 18   Ht 5' (1.524 m)   Wt 62.1 kg (137 lb)   LMP 03/22/2016   SpO2 99%   Breastfeeding? Unknown   BMI 26.76 kg/m  Fundus firm   Recent Labs  12/13/16 1525  HGB 13.8  HCT 38.9     Plan: discharge today - postpartum care discussed - f/u clinic in 4 weeks for postpartum visit- needs inspection of cx lac   Serita Grammes, CNM 8:26 AM 12/15/2016

## 2016-12-15 NOTE — Plan of Care (Signed)
Problem: Education: Goal: Knowledge of condition will improve Outcome: Progressing Patient instructed by interpreter how to activate baby box video.  Patient to watch video before discharge

## 2016-12-15 NOTE — Progress Notes (Signed)
I assisted Butch Penny, RN with questions and Delano Metz with explanation of care plan. [place] Interpreter.

## 2016-12-15 NOTE — Lactation Note (Addendum)
This note was copied from a baby's chart. Lactation Consultation Note  Patient Name: Alyssa Wallace [id number] Date: 12/15/2016 Reason for consult: Follow-up assessment;Infant weight loss (Eda Royal - Spanish interpreter present - for consult , 1% weight loss ) LC reviewed doc flow sheets and updated per mom.  Per mom the tenderness is still present when the baby initially latches and then improves or I take the baby off the breast.  LC reviewed the [facility] plan set up yesterday and reminded mom after breast massage, hand express, to pre pump with hand pump if needed will help soreness. LC assessed breast tissue and the areolas or more compressible today and milk is coming in.  Sore nipple and engorgement prevention and tx reviewed. Mom has a hand pump and LC instructed mom on the use of shells.  Mother informed of post-discharge support and given phone number to the lactation department, including services for phone call assistance; out-patient appointments; and breastfeeding support group. List of other breastfeeding resources in the community given in the handout. Encouraged mother to call for problems or concerns related to breastfeeding.     Maternal Data Has patient been taught Hand Expression?: Yes  Feeding ( this Latch score was by the [facility] RN  Feeding Type:  (per mom baby recently breastfed and supplemented ) Length of feed: 25 min (per mom )  LATCH Score Latch: Grasps breast easily, tongue down, lips flanged, rhythmical sucking.  Audible Swallowing: Spontaneous and intermittent  Type of Nipple: Everted at rest and after stimulation  Comfort (Breast/Nipple): Soft / non-tender  Hold (Positioning): No assistance needed to correctly position infant at breast.  LATCH Score: 10  Interventions Interventions: Breast feeding basics reviewed  Lactation Tools Discussed/Used     Consult Status Consult Status: Complete Date: 12/15/16    Myer Haff 12/15/2016,  10:41 AM

## 2016-12-20 ENCOUNTER — Encounter: Payer: Self-pay | Admitting: General Practice

## 2016-12-23 ENCOUNTER — Encounter: Payer: Self-pay | Admitting: Obstetrics and Gynecology

## 2016-12-24 ENCOUNTER — Ambulatory Visit (INDEPENDENT_AMBULATORY_CARE_PROVIDER_SITE_OTHER): Payer: Self-pay

## 2016-12-24 DIAGNOSIS — Z113 Encounter for screening for infections with a predominantly sexual mode of transmission: Secondary | ICD-10-CM

## 2016-12-24 DIAGNOSIS — N898 Other specified noninflammatory disorders of vagina: Secondary | ICD-10-CM

## 2016-12-24 NOTE — Progress Notes (Signed)
[place] interpreter Gwenlyn Perking [phone].Patient presented to the office with vaginal itching and discharge but denies odor. Patient self swabbed. Informed patient that she will receive a call when her results come in. Patient verbalized understanding and had no questions.

## 2016-12-27 LAB — CERVICOVAGINAL ANCILLARY ONLY
Bacterial vaginitis: NEGATIVE
Candida vaginitis: NEGATIVE
Chlamydia: NEGATIVE
NEISSERIA GONORRHEA: NEGATIVE
Trichomonas: NEGATIVE

## 2016-12-29 ENCOUNTER — Telehealth: Payer: Self-pay | Admitting: *Deleted

## 2016-12-29 NOTE — Telephone Encounter (Signed)
-----   Message from Aletha Halim, MD sent at 12/28/2016  9:46 AM EDT ----- Can you let her know it was negative? thanks

## 2016-12-29 NOTE — Telephone Encounter (Signed)
I called Alyssa Wallace with [place] [phone] and notified her per Dr. Ilda Basset wet prep negative. I reviewed her postpartum appt with her. She voices understanding.

## 2017-01-20 ENCOUNTER — Ambulatory Visit: Payer: Self-pay | Admitting: Advanced Practice Midwife

## 2017-04-25 ENCOUNTER — Ambulatory Visit: Payer: Self-pay | Attending: Family Medicine | Admitting: Family Medicine

## 2017-04-25 ENCOUNTER — Encounter: Payer: Self-pay | Admitting: Family Medicine

## 2017-04-25 VITALS — BP 94/60 | HR 73 | Temp 98.7°F | Resp 18 | Ht 61.0 in | Wt 121.0 lb

## 2017-04-25 DIAGNOSIS — M67431 Ganglion, right wrist: Secondary | ICD-10-CM | POA: Insufficient documentation

## 2017-04-25 MED ORDER — ACETAMINOPHEN 500 MG PO TABS: 1000.0000 mg | ORAL_TABLET | Freq: Four times a day (QID) | ORAL | 0 refills | Status: DC | PRN

## 2017-04-25 NOTE — Patient Instructions (Signed)
Quiste ganglionar (Ganglion Cyst) Un quiste ganglionar es un bulto no canceroso lleno de lquido que se forma cerca de las articulaciones o los tendones. El quiste ganglionar crece fuera de una articulacin o de la membrana de un tendn. La mayora de las veces aparece en la mano o la mueca, pero tambin puede formarse en el hombro, el codo, la cadera, la rodilla, el tobillo o el pie. El quiste ganglionar redondo u ovalado puede tener el tamao de un guisante o ser ms grande que una uva. El incremento de la actividad puede aumentar su tamao porque empieza a acumularse ms lquido. CAUSAS Se desconoce qu causa el crecimiento de un quiste ganglionar. Sin embargo, puede guardar relacin con:  Inflamacin o irritacin alrededor de la articulacin.  Una lesin.  Los movimientos repetitivos o el uso excesivo.  Artritis. FACTORES DE RIESGO Entre los factores de riesgo se incluyen los siguientes:  Ser mujer.  Tener entre 20 y 50aos. SIGNOS Y SNTOMAS Entre los sntomas se pueden incluir los siguientes:  Un bulto. Este aparece con ms frecuencia en la mano o la mueca, pero tambin puede presentarse en otras zonas del cuerpo.  Hormigueo.  Dolor.  Entumecimiento.  Debilidad muscular.  Sujecin dbil.  Prdida del movimiento de una articulacin. DIAGNSTICO La mayora de las veces los quistes ganglionares se diagnostican en funcin de un examen fsico. El mdico palpar el bulto y puede iluminarlo para ver si la luz pasa a travs de este. Si es un quiste ganglionar, la luz suele traspasarlo. El mdico puede indicarle una radiografa, una ecografa o una resonancia magntica para descartar otras afecciones. TRATAMIENTO Generalmente, los quistes ganglionares desaparecen solos sin tratamiento. Si hay dolor u otros sntomas, tal vez se necesite tratamiento. Tambin es necesario un tratamiento si el quiste ganglionar limita sus movimientos o si se infecta. El tratamiento puede incluir lo  siguiente:  Usar una frula o una tablilla en la mueca o el dedo.  Medicamentos antiinflamatorios.  Extraer lquido del bulto con una aguja (aspiracin).  Inyectar un corticoide en la articulacin.  Ciruga para extirpar el quiste ganglionar. INSTRUCCIONES PARA EL CUIDADO EN EL HOGAR  No haga presin sobre el quiste ganglionar, no lo pinche con una aguja ni lo golpee.  Tome los medicamentos solamente como se lo haya indicado el mdico.  Use la frula o la tablilla como se lo haya indicado el mdico.  Controle el quiste ganglionar para detectar cualquier cambio.  Concurra a todas las visitas de control como se lo haya indicado el mdico. Esto es importante. SOLICITE ATENCIN MDICA SI:  El quiste ganglionar se agranda o le provoca ms dolor.  Aumenta el enrojecimiento, las lneas rojas o la hinchazn.  Observa que sale pus del bulto.  Siente debilidad o adormecimiento alrededor de la zona afectada.  Tiene fiebre o siente escalofros. Esta informacin no tiene como fin reemplazar el consejo del mdico. Asegrese de hacerle al mdico cualquier pregunta que tenga. Document Released: 02/03/2005 Document Revised: 05/17/2014 Document Reviewed: 10/09/2013 Elsevier Interactive Patient Education  2018 Elsevier Inc.  

## 2017-04-25 NOTE — Progress Notes (Signed)
   Subjective:  Patient ID: Alyssa Wallace, female    DOB: October 10, 1985  Age: 31 y.o. MRN: [id number]  CC: Wrist Pain (right)   HPI Carson Meche presents for right wrist and hand pain. Onset 4 months ago. No history of known injury. Pain 3/10, and extends intermittently from hand to forearm. She describes the pain as sharp. She is currently not taking anything for pain.    Outpatient Medications Prior to Visit  Medication Sig Dispense Refill  . ibuprofen (ADVIL,MOTRIN) 600 MG tablet Take 1 tablet (600 mg total) by mouth every 6 (six) hours. 30 tablet 0  . Prenatal Vit-Fe Fumarate-FA (PRENATAL/FOLIC ACID) TABS Take 1 tablet by mouth daily. (Patient taking differently: Take 1 tablet by mouth at bedtime. ) 30 each 11  . acetaminophen (TYLENOL) 500 MG tablet Take 1,000 mg by mouth every 6 (six) hours as needed for mild pain, moderate pain, fever or headache.     No facility-administered medications prior to visit.     ROS Review of Systems  Constitutional: Negative.   Respiratory: Negative.   Cardiovascular: Negative.   Musculoskeletal: Positive for arthralgias (right wrist/ hand).        Objective:  BP 94/60 (BP Location: Left Arm, Patient Position: Sitting, Cuff Size: Normal)   Pulse 73   Temp 98.7 F (37.1 C) (Oral)   Resp 18   Ht 5\' 1"  (1.549 m)   Wt 121 lb (54.9 kg)   SpO2 97%   BMI 22.86 kg/m   BP/Weight 04/25/2017 06/19/4763 08/13/5033  Systolic BP 94 93 -  Diastolic BP 60 59 -  Wt. (Lbs) 121 - 137  BMI 22.86 - 26.76     Physical Exam  Constitutional: She appears well-developed and well-nourished.  Cardiovascular: Normal rate, regular rhythm, normal heart sounds and intact distal pulses.  Pulmonary/Chest: Effort normal and breath sounds normal.  Musculoskeletal:       Right wrist: She exhibits tenderness and swelling (skin mass). She exhibits normal range of motion.       Left wrist: She exhibits normal range of motion, no tenderness and no  swelling.       Right hand: Normal sensation noted. Normal strength noted.       Left hand: She exhibits normal range of motion. Normal sensation noted. Normal strength noted.  Skin: Skin is warm and dry.  Nursing note and vitals reviewed.   Assessment & Plan:   1. Ganglion cyst of dorsum of right wrist  - Ambulatory referral to Hand Surgery - DG Wrist Complete Left; Future - acetaminophen (TYLENOL) 500 MG tablet; Take 2 tablets (1,000 mg total) by mouth every 6 (six) hours as needed for moderate pain.  Dispense: 30 tablet; Refill: 0     Follow-up: Return As needed.   Alfonse Spruce FNP

## 2017-05-24 ENCOUNTER — Telehealth: Payer: Self-pay | Admitting: Family Medicine

## 2017-05-24 NOTE — Telephone Encounter (Signed)
Pt called to receive an update on her referral, She was advised to wait for the call because our referral coordinator is behind. Please follow up

## 2017-06-07 NOTE — Telephone Encounter (Signed)
Unable to process her Orthopedic Referral CAFA expired . I called patient nobody answer

## 2017-06-08 NOTE — Telephone Encounter (Signed)
Pt called back and I explained what she had to do to renew her OC and have her referral fixed

## 2017-06-22 ENCOUNTER — Ambulatory Visit: Payer: Self-pay | Attending: Family Medicine

## 2017-08-05 ENCOUNTER — Ambulatory Visit (INDEPENDENT_AMBULATORY_CARE_PROVIDER_SITE_OTHER): Payer: Self-pay

## 2017-08-05 ENCOUNTER — Encounter (INDEPENDENT_AMBULATORY_CARE_PROVIDER_SITE_OTHER): Payer: Self-pay | Admitting: Orthopaedic Surgery

## 2017-08-05 ENCOUNTER — Ambulatory Visit (INDEPENDENT_AMBULATORY_CARE_PROVIDER_SITE_OTHER): Payer: Self-pay | Admitting: Orthopaedic Surgery

## 2017-08-05 DIAGNOSIS — M67431 Ganglion, right wrist: Secondary | ICD-10-CM

## 2017-08-05 NOTE — Progress Notes (Signed)
Office Visit Note   Patient: Alyssa Wallace           Date of Birth: 09/24/1985           MRN: [id number] Visit Date: 08/05/2017              Requested by: Alfonse Spruce, FNP No address on file PCP: Alfonse Spruce, FNP   Assessment & Plan: Visit Diagnoses:  1. Ganglion cyst of dorsum of right wrist     Plan: Impression is 32 year old female with a right dorsal wrist ganglion cyst.  We discussed surgical versus nonsurgical treatment options including the associated risks and benefits.  After risk-benefit analysis patient wishes to proceed with surgical excision.  Questions encouraged and answered.  Today's encounter was performed through an interpreter which did add to the complexity of the encounter.  Follow-Up Instructions: Return for 2 week postop visit.   Orders:  Orders Placed This Encounter  Procedures  . XR Wrist Complete Right   No orders of the defined types were placed in this encounter.     Procedures: No procedures performed   Clinical Data: No additional findings.   Subjective: Chief Complaint  Patient presents with  . Right Wrist - Pain, Follow-up    Patient is a 32 year old Hispanic female comes in with 1 year history of a right dorsal wrist ganglion cyst.  This is been bothering her with use of the hand.  She will endorse some pain that radiates up the dorsum of the forearm.  Denies any numbness and tingling.   Review of Systems  Constitutional: Negative.   HENT: Negative.   Eyes: Negative.   Respiratory: Negative.   Cardiovascular: Negative.   Endocrine: Negative.   Musculoskeletal: Negative.   Neurological: Negative.   Hematological: Negative.   Psychiatric/Behavioral: Negative.   All other systems reviewed and are negative.    Objective: Vital Signs: There were no vitals taken for this visit.  Physical Exam  Constitutional: She is oriented to person, place, and time. She appears well-developed and  well-nourished.  HENT:  Head: Normocephalic and atraumatic.  Eyes: EOM are normal.  Neck: Neck supple.  Pulmonary/Chest: Effort normal.  Abdominal: Soft.  Neurological: She is alert and oriented to person, place, and time.  Skin: Skin is warm. Capillary refill takes less than 2 seconds.  Psychiatric: She has a normal mood and affect. Her behavior is normal. Judgment and thought content normal.  Nursing note and vitals reviewed.   Ortho Exam Right wrist exam shows a palpable ganglion cyst that transilluminates with light.  There is no aggressive features.  She has full motor and sensory sensation and function Specialty Comments:  No specialty comments available.  Imaging: Xr Wrist Complete Right  Result Date: 08/05/2017 No acute or structural abnormalities    PMFS History: Patient Active Problem List   Diagnosis Date Noted  . PROM (premature rupture of membranes) 12/13/2016  . GBS (group B Streptococcus carrier), +RV culture, currently pregnant 12/06/2016  . Short cervix with cervical cerclage, antepartum 10/21/2016  . Supervision of high-risk pregnancy 06/14/2016  . Previous cesarean section complicating pregnancy 10/93/2355  . Previous preterm delivery x 2, antepartum 06/14/2016  . Language barrier 06/14/2016   Past Medical History:  Diagnosis Date  . Preterm labor     Family History  Problem Relation Age of Onset  . Diabetes Mother   . Diabetes Father   . Diabetes Sister     Past Surgical History:  Procedure Laterality Date  .  CERVICAL CERCLAGE N/A 07/30/2016   Procedure: CERCLAGE CERVICAL;  Surgeon: Woodroe Mode, MD;  Location: [facility];  Service: Gynecology;  Laterality: N/A;  . CESAREAN SECTION  04/05/2012   Procedure: CESAREAN SECTION;  Surgeon: Woodroe Mode, MD;  Location: [facility];  Service: Obstetrics;  Laterality: N/A;  Primary Cesarean Section Delivery Girl @ [phone], Apgars 5/8   Social History   Occupational History  . Not on file  Tobacco Use  .  Smoking status: Never Smoker  . Smokeless tobacco: Never Used  Substance and Sexual Activity  . Alcohol use: No  . Drug use: No  . Sexual activity: Not Currently

## 2017-08-16 ENCOUNTER — Encounter (HOSPITAL_BASED_OUTPATIENT_CLINIC_OR_DEPARTMENT_OTHER): Payer: Self-pay | Admitting: *Deleted

## 2017-08-16 ENCOUNTER — Other Ambulatory Visit: Payer: Self-pay

## 2017-08-16 NOTE — Progress Notes (Signed)
Bring all medications. Requested interpreter [phone].

## 2017-08-17 ENCOUNTER — Ambulatory Visit (HOSPITAL_BASED_OUTPATIENT_CLINIC_OR_DEPARTMENT_OTHER): Payer: Self-pay | Admitting: Anesthesiology

## 2017-08-17 ENCOUNTER — Ambulatory Visit (HOSPITAL_BASED_OUTPATIENT_CLINIC_OR_DEPARTMENT_OTHER)
Admission: RE | Admit: 2017-08-17 | Discharge: 2017-08-17 | Disposition: A | Discharge status: Home / Self Care | Payer: Self-pay | Source: Ambulatory Visit | Attending: Orthopaedic Surgery | Admitting: Orthopaedic Surgery

## 2017-08-17 ENCOUNTER — Encounter (HOSPITAL_BASED_OUTPATIENT_CLINIC_OR_DEPARTMENT_OTHER)
Admission: RE | Disposition: A | Discharge status: Home / Self Care | Payer: Self-pay | Source: Ambulatory Visit | Attending: Orthopaedic Surgery

## 2017-08-17 ENCOUNTER — Encounter (HOSPITAL_BASED_OUTPATIENT_CLINIC_OR_DEPARTMENT_OTHER): Payer: Self-pay | Admitting: Anesthesiology

## 2017-08-17 DIAGNOSIS — M67431 Ganglion, right wrist: Secondary | ICD-10-CM

## 2017-08-17 DIAGNOSIS — M659 Synovitis and tenosynovitis, unspecified: Secondary | ICD-10-CM | POA: Insufficient documentation

## 2017-08-17 HISTORY — PX: GANGLION CYST EXCISION: SHX1691

## 2017-08-17 SURGERY — EXCISION, GANGLION CYST, WRIST
Anesthesia: Monitor Anesthesia Care | Site: Wrist | Laterality: Right

## 2017-08-17 MED ORDER — ONDANSETRON HCL 4 MG/2ML IJ SOLN
INTRAMUSCULAR | Status: DC | PRN
  Administered 2017-08-17: 4 mg via INTRAVENOUS

## 2017-08-17 MED ORDER — BUPIVACAINE HCL (PF) 0.5 % IJ SOLN
INTRAMUSCULAR | Status: AC
  Filled 2017-08-17: qty 30

## 2017-08-17 MED ORDER — ONDANSETRON HCL 4 MG/2ML IJ SOLN
INTRAMUSCULAR | Status: AC
  Filled 2017-08-17: qty 2

## 2017-08-17 MED ORDER — HYDROCODONE-ACETAMINOPHEN 7.5-325 MG PO TABS: 1.0000 | ORAL_TABLET | Freq: Once | ORAL | Status: DC | PRN

## 2017-08-17 MED ORDER — SCOPOLAMINE 1 MG/3DAYS TD PT72: 1.0000 | MEDICATED_PATCH | Freq: Once | TRANSDERMAL | Status: DC | PRN

## 2017-08-17 MED ORDER — LIDOCAINE HCL (PF) 0.5 % IJ SOLN
INTRAMUSCULAR | Status: DC | PRN
  Administered 2017-08-17: 40 mL via INTRAVENOUS

## 2017-08-17 MED ORDER — CEFAZOLIN SODIUM-DEXTROSE 2-4 GM/100ML-% IV SOLN
INTRAVENOUS | Status: AC
  Filled 2017-08-17: qty 100

## 2017-08-17 MED ORDER — PROPOFOL 500 MG/50ML IV EMUL
INTRAVENOUS | Status: AC
  Filled 2017-08-17: qty 50

## 2017-08-17 MED ORDER — LACTATED RINGERS IV SOLN
INTRAVENOUS | Status: DC
  Administered 2017-08-17: 09:00:00 via INTRAVENOUS

## 2017-08-17 MED ORDER — ONDANSETRON HCL 4 MG PO TABS: 4.0000 mg | ORAL_TABLET | Freq: Three times a day (TID) | ORAL | 0 refills | Status: DC | PRN

## 2017-08-17 MED ORDER — FENTANYL CITRATE (PF) 100 MCG/2ML IJ SOLN: 25.0000 ug | INTRAMUSCULAR | Status: DC | PRN

## 2017-08-17 MED ORDER — HYDROCODONE-ACETAMINOPHEN 5-325 MG PO TABS: 1.0000 | ORAL_TABLET | Freq: Four times a day (QID) | ORAL | 0 refills | Status: DC | PRN

## 2017-08-17 MED ORDER — MEPERIDINE HCL 25 MG/ML IJ SOLN: 6.2500 mg | INTRAMUSCULAR | Status: DC | PRN

## 2017-08-17 MED ORDER — CHLORHEXIDINE GLUCONATE 4 % EX LIQD: 60.0000 mL | Freq: Once | CUTANEOUS | Status: DC

## 2017-08-17 MED ORDER — PROPOFOL 500 MG/50ML IV EMUL
INTRAVENOUS | Status: DC | PRN
  Administered 2017-08-17: 75 ug/kg/min via INTRAVENOUS

## 2017-08-17 MED ORDER — SENNOSIDES-DOCUSATE SODIUM 8.6-50 MG PO TABS: 1.0000 | ORAL_TABLET | Freq: Every evening | ORAL | 1 refills | Status: DC | PRN

## 2017-08-17 MED ORDER — FENTANYL CITRATE (PF) 100 MCG/2ML IJ SOLN
INTRAMUSCULAR | Status: AC
  Filled 2017-08-17: qty 2

## 2017-08-17 MED ORDER — LACTATED RINGERS IV SOLN: INTRAVENOUS | Status: DC

## 2017-08-17 MED ORDER — FENTANYL CITRATE (PF) 100 MCG/2ML IJ SOLN
50.0000 ug | INTRAMUSCULAR | Status: DC | PRN
  Administered 2017-08-17: 50 ug via INTRAVENOUS

## 2017-08-17 MED ORDER — PROMETHAZINE HCL 25 MG PO TABS: 25.0000 mg | ORAL_TABLET | Freq: Four times a day (QID) | ORAL | 1 refills | Status: DC | PRN

## 2017-08-17 MED ORDER — MIDAZOLAM HCL 2 MG/2ML IJ SOLN
1.0000 mg | INTRAMUSCULAR | Status: DC | PRN
  Administered 2017-08-17: 1 mg via INTRAVENOUS

## 2017-08-17 MED ORDER — LIDOCAINE HCL (CARDIAC) 20 MG/ML IV SOLN
INTRAVENOUS | Status: DC | PRN
  Administered 2017-08-17: 50 mg via INTRAVENOUS

## 2017-08-17 MED ORDER — CEFAZOLIN SODIUM-DEXTROSE 2-4 GM/100ML-% IV SOLN
2.0000 g | INTRAVENOUS | Status: AC
  Administered 2017-08-17: 2 g via INTRAVENOUS

## 2017-08-17 MED ORDER — BUPIVACAINE HCL (PF) 0.5 % IJ SOLN
INTRAMUSCULAR | Status: DC | PRN
  Administered 2017-08-17: 10 mL

## 2017-08-17 MED ORDER — MIDAZOLAM HCL 2 MG/2ML IJ SOLN
INTRAMUSCULAR | Status: AC
Start: 2017-08-17 — End: ?
  Filled 2017-08-17: qty 2

## 2017-08-17 MED ORDER — METOCLOPRAMIDE HCL 5 MG/ML IJ SOLN: 10.0000 mg | Freq: Once | INTRAMUSCULAR | Status: DC | PRN

## 2017-08-17 SURGICAL SUPPLY — 59 items
ADH SKN CLS APL DERMABOND .7 (GAUZE/BANDAGES/DRESSINGS)
BLADE HEX COATED 2.75 (ELECTRODE) IMPLANT
BLADE SURG 15 STRL LF DISP TIS (BLADE) ×2 IMPLANT
BLADE SURG 15 STRL SS (BLADE) ×6
BNDG CMPR 9X4 STRL LF SNTH (GAUZE/BANDAGES/DRESSINGS) ×1
BNDG COHESIVE 4X5 TAN STRL (GAUZE/BANDAGES/DRESSINGS) IMPLANT
BNDG ESMARK 4X9 LF (GAUZE/BANDAGES/DRESSINGS) ×3 IMPLANT
BRUSH SCRUB EZ PLAIN DRY (MISCELLANEOUS) ×3 IMPLANT
CANISTER SUCT 1200ML W/VALVE (MISCELLANEOUS) IMPLANT
CORD BIPOLAR FORCEPS 12FT (ELECTRODE) IMPLANT
COVER BACK TABLE 60X90IN (DRAPES) ×3 IMPLANT
CUFF TOURNIQUET SINGLE 18IN (TOURNIQUET CUFF) ×3 IMPLANT
DECANTER SPIKE VIAL GLASS SM (MISCELLANEOUS) IMPLANT
DERMABOND ADVANCED (GAUZE/BANDAGES/DRESSINGS)
DERMABOND ADVANCED .7 DNX12 (GAUZE/BANDAGES/DRESSINGS) IMPLANT
DRAPE EXTREMITY T 121X128X90 (DRAPE) ×3 IMPLANT
DRAPE IMP U-DRAPE 54X76 (DRAPES) ×3 IMPLANT
DRAPE SURG 17X23 STRL (DRAPES) ×4 IMPLANT
DRSG EMULSION OIL 3X3 NADH (GAUZE/BANDAGES/DRESSINGS) ×3 IMPLANT
ELECT REM PT RETURN 9FT ADLT (ELECTROSURGICAL)
ELECTRODE REM PT RTRN 9FT ADLT (ELECTROSURGICAL) ×1 IMPLANT
GAUZE SPONGE 4X4 12PLY STRL (GAUZE/BANDAGES/DRESSINGS) ×3 IMPLANT
GAUZE SPONGE 4X4 16PLY XRAY LF (GAUZE/BANDAGES/DRESSINGS) IMPLANT
GAUZE XEROFORM 1X8 LF (GAUZE/BANDAGES/DRESSINGS) ×3 IMPLANT
GLOVE BIOGEL PI IND STRL 7.0 (GLOVE) ×1 IMPLANT
GLOVE BIOGEL PI INDICATOR 7.0 (GLOVE) ×6
GLOVE ECLIPSE 6.5 STRL STRAW (GLOVE) ×3 IMPLANT
GLOVE ECLIPSE 7.0 STRL STRAW (GLOVE) ×3 IMPLANT
GLOVE SKINSENSE NS SZ7.5 (GLOVE) ×2
GLOVE SKINSENSE STRL SZ7.5 (GLOVE) ×1 IMPLANT
GLOVE SURG SYN 7.5  E (GLOVE) ×2
GLOVE SURG SYN 7.5 E (GLOVE) ×1 IMPLANT
GOWN STRL REIN XL XLG (GOWN DISPOSABLE) ×3 IMPLANT
GOWN STRL REUS W/ TWL LRG LVL3 (GOWN DISPOSABLE) ×1 IMPLANT
GOWN STRL REUS W/ TWL XL LVL3 (GOWN DISPOSABLE) ×1 IMPLANT
GOWN STRL REUS W/TWL LRG LVL3 (GOWN DISPOSABLE) ×3
GOWN STRL REUS W/TWL XL LVL3 (GOWN DISPOSABLE) ×3
NS IRRIG 1000ML POUR BTL (IV SOLUTION) ×2 IMPLANT
PACK BASIN DAY SURGERY FS (CUSTOM PROCEDURE TRAY) ×3 IMPLANT
PAD CAST 4YDX4 CTTN HI CHSV (CAST SUPPLIES) IMPLANT
PADDING CAST COTTON 4X4 STRL (CAST SUPPLIES)
PADDING CAST SYNTHETIC 4 (CAST SUPPLIES)
PADDING CAST SYNTHETIC 4X4 STR (CAST SUPPLIES) IMPLANT
PENCIL BUTTON HOLSTER BLD 10FT (ELECTRODE) IMPLANT
SPLINT UNIVERSAL RIGHT (SOFTGOODS) ×2 IMPLANT
SPONGE LAP 18X18 RF (DISPOSABLE) IMPLANT
STOCKINETTE 4X48 STRL (DRAPES) IMPLANT
SUCTION FRAZIER HANDLE 10FR (MISCELLANEOUS) ×2
SUCTION TUBE FRAZIER 10FR DISP (MISCELLANEOUS) ×1 IMPLANT
SUT ETHILON 2 0 FS 18 (SUTURE) IMPLANT
SUT ETHILON 4 0 PS 2 18 (SUTURE) IMPLANT
SUT VIC AB 0 CT1 27 (SUTURE) ×3
SUT VIC AB 0 CT1 27XBRD ANBCTR (SUTURE) ×1 IMPLANT
SUT VIC AB 2-0 CT1 27 (SUTURE)
SUT VIC AB 2-0 CT1 TAPERPNT 27 (SUTURE) IMPLANT
SYR BULB 3OZ (MISCELLANEOUS) ×2 IMPLANT
TOWEL OR 17X24 6PK STRL BLUE (TOWEL DISPOSABLE) ×3 IMPLANT
TOWEL OR NON WOVEN STRL DISP B (DISPOSABLE) ×3 IMPLANT
YANKAUER SUCT BULB TIP NO VENT (SUCTIONS) IMPLANT

## 2017-08-17 NOTE — Op Note (Addendum)
   Date of Surgery: 08/17/2017  INDICATIONS: Ms. Alyssa Wallace is a 32 y.o.-year-old female with a right dorsal wrist ganglion cyst;  The patient did consent to the procedure after discussion of the risks and benefits.  PREOPERATIVE DIAGNOSIS:  1. Right dorsal wrist ganglion cyst 2. Tenosynovitis of ECRL, ECRB, EPL tendons  POSTOPERATIVE DIAGNOSIS: Same.  PROCEDURE:  1. Removal of right dorsal wrist ganglion cyst 2. Tenolysis of ECRL, ECRB, EPL tendons  SURGEON: N. Eduard Roux, M.D.  ASSIST: Ciro Backer [place], [place]; necessary for the timely completion of procedure and due to complexity of procedure.  ANESTHESIA:  Bier block  IV FLUIDS AND URINE: See anesthesia.  ESTIMATED BLOOD LOSS: minimal mL.  IMPLANTS: none  SPECIMENS: 1 ganglion cyst  COMPLICATIONS: None.  DESCRIPTION OF PROCEDURE: The patient was brought to the operating room and placed supine on the operating table.  The patient had been signed prior to the procedure and this was documented. The patient had the anesthesia placed by the anesthesiologist.  A time-out was performed to confirm that this was the correct patient, site, side and location. The patient did receive antibiotics prior to the incision and was re-dosed during the procedure as needed at indicated intervals.  A tourniquet was placed.  The patient had the operative extremity prepped and draped in the standard surgical fashion.    A longitudinal incision was made directly over the ganglion cyst.  Dissection was carried through the subcutaneous tissue.  The extensor retinaculum was partially released in order to continue our dissection.  The ganglion cyst emanated from the dorsal wrist capsule between the second and third dorsal wrist compartments.  Ganglion cyst was traced down onto the dorsal wrist capsule.  The cyst was taken out as a whole.  This was sent off to pathology.  There were no aggressive features.  Tenolysis of the ECRL, ECRB, EPL tendons was  performed using Metzenbaum scissors.  The wound was then thoroughly irrigated.  There is no evidence of a stalk that I could identified.  The dorsal wrist capsule was excised along with the ganglion cyst.  Hemostasis was then obtained.  Surgical wound was thoroughly irrigated.  Local anesthesia was infiltrated.  The skin was closed with interrupted 4-0 nylon sutures.  Sterile dressings were applied.  Patient was placed in a removable wrist brace for mobilization.  Patient tolerated procedure well and no immediate complications.  POSTOPERATIVE PLAN: Patient will be discharged home and follow-up in 2 weeks for suture removal.  Azucena Cecil, MD [place] 9:07 AM

## 2017-08-17 NOTE — Discharge Instructions (Signed)
Post Anesthesia Home Care Instructions  Activity: Get plenty of rest for the remainder of the day. A responsible individual must stay with you for 24 hours following the procedure.  For the next 24 hours, DO NOT: -Drive a car -Paediatric nurse -Drink alcoholic beverages -Take any medication unless instructed by your physician -Make any legal decisions or sign important papers.  Meals: Start with liquid foods such as gelatin or soup. Progress to regular foods as tolerated. Avoid greasy, spicy, heavy foods. If nausea and/or vomiting occur, drink only clear liquids until the nausea and/or vomiting subsides. Call your physician if vomiting continues.  Special Instructions/Symptoms: Your throat may feel dry or sore from the anesthesia or the breathing tube placed in your throat during surgery. If this causes discomfort, gargle with warm salt water. The discomfort should disappear within 24 hours.  If you had a scopolamine patch placed behind your ear for the management of post- operative nausea and/or vomiting:  1. The medication in the patch is effective for 72 hours, after which it should be removed.  Wrap patch in a tissue and discard in the trash. Wash hands thoroughly with soap and water. 2. You may remove the patch earlier than 72 hours if you experience unpleasant side effects which may include dry mouth, dizziness or visual disturbances. 3. Avoid touching the patch. Wash your hands with soap and water after contact with the patch.      Postoperative instructions:  Weightbearing instructions: no heavy lifting, remain in wrist brace until follow up appointment  Keep your dressing and/or splint clean and dry at all times.  You can remove your dressing on post-operative day #3 and change with a dry/sterile dressing or Band-Aids as needed thereafter.    Incision instructions:  Do not soak your incision for 3 weeks after surgery.  If the incision gets wet, pat dry and do not scrub the  incision.  Pain control:  You have been given a prescription to be taken as directed for post-operative pain control.  In addition, elevate the operative extremity above the heart at all times to prevent swelling and throbbing pain.  Take over-the-counter Colace, 100mg  by mouth twice a day while taking narcotic pain medications to help prevent constipation.  Follow up appointments: 1) 10-14 days for suture removal and wound check. 2) Alyssa Wallace as scheduled.   -------------------------------------------------------------------------------------------------------------  After Surgery Pain Control:  After your surgery, post-surgical discomfort or pain is likely. This discomfort can last several days to a few weeks. At certain times of the day your discomfort may be more intense.  Did you receive a nerve block?  A nerve block can provide pain relief for one hour to two days after your surgery. As long as the nerve block is working, you will experience little or no sensation in the area the surgeon operated on.  As the nerve block wears off, you will begin to experience pain or discomfort. It is very important that you begin taking your prescribed pain medication before the nerve block fully wears off. Treating your pain at the first sign of the block wearing off will ensure your pain is better controlled and more tolerable when full-sensation returns. Do not wait until the pain is intolerable, as the medicine will be less effective. It is better to treat pain in advance than to try and catch up.  General Anesthesia:  If you did not receive a nerve block during your surgery, you will need to start taking your pain medication  shortly after your surgery and should continue to do so as prescribed by your surgeon.  Pain Medication:  Most commonly we prescribe Vicodin and Percocet for post-operative pain. Both of these medications contain a combination of acetaminophen (Tylenol) and a narcotic to help  control pain.   It takes between 30 and 45 minutes before pain medication starts to work. It is important to take your medication before your pain level gets too intense.   Nausea is a common side effect of many pain medications. You will want to eat something before taking your pain medicine to help prevent nausea.   If you are taking a prescription pain medication that contains acetaminophen, we recommend that you do not take additional over the counter acetaminophen (Tylenol).  Other pain relieving options:   Using a cold pack to ice the affected area a few times a day (15 to 20 minutes at a time) can help to relieve pain, reduce swelling and bruising.   Elevation of the affected area can also help to reduce pain and swelling.

## 2017-08-17 NOTE — Anesthesia Postprocedure Evaluation (Signed)
Anesthesia Post Note  Patient: Alyssa Wallace  Procedure(s) Performed: REMOVAL GANGLION OF RIGHT WRIST (Right Wrist)     Patient location during evaluation: PACU Anesthesia Type: Bier Block Level of consciousness: awake and alert and oriented Pain management: pain level controlled Vital Signs Assessment: post-procedure vital signs reviewed and stable Respiratory status: spontaneous breathing, nonlabored ventilation and respiratory function stable Cardiovascular status: blood pressure returned to baseline and stable Postop Assessment: no apparent nausea or vomiting Anesthetic complications: no    Last Vitals:  Vitals:   08/17/17 0923 08/17/17 0930  BP:  104/66  Pulse: 60 (!) 56  Resp: 14 14  Temp:    SpO2: 100% 100%    Last Pain:  Vitals:   08/17/17 0923  TempSrc:   PainSc: 0-No pain                 Elmira Olkowski A.

## 2017-08-17 NOTE — H&P (Signed)
PREOPERATIVE H&P  Chief Complaint: right wrist ganglion cyst  HPI: Alyssa Wallace is a 32 y.o. female who presents for surgical treatment of right wrist ganglion cyst.  She denies any changes in medical history.  Past Medical History:  Diagnosis Date  . Preterm labor    Past Surgical History:  Procedure Laterality Date  . CERVICAL CERCLAGE N/A 07/30/2016   Procedure: CERCLAGE CERVICAL;  Surgeon: Woodroe Mode, MD;  Location: [facility];  Service: Gynecology;  Laterality: N/A;  . CESAREAN SECTION  04/05/2012   Procedure: CESAREAN SECTION;  Surgeon: Woodroe Mode, MD;  Location: [facility];  Service: Obstetrics;  Laterality: N/A;  Primary Cesarean Section Delivery Girl @ [phone], Apgars 5/8   Social History   Socioeconomic History  . Marital status: Single    Spouse name: Not on file  . Number of children: Not on file  . Years of education: Not on file  . Highest education level: Not on file  Occupational History  . Not on file  Social Needs  . Financial resource strain: Not on file  . Food insecurity:    Worry: Not on file    Inability: Not on file  . Transportation needs:    Medical: Not on file    Non-medical: Not on file  Tobacco Use  . Smoking status: Never Smoker  . Smokeless tobacco: Never Used  Substance and Sexual Activity  . Alcohol use: No  . Drug use: No  . Sexual activity: Yes    Birth control/protection: Condom  Lifestyle  . Physical activity:    Days per week: Not on file    Minutes per session: Not on file  . Stress: Not on file  Relationships  . Social connections:    Talks on phone: Not on file    Gets together: Not on file    Attends religious service: Not on file    Active member of club or organization: Not on file    Attends meetings of clubs or organizations: Not on file    Relationship status: Not on file  Other Topics Concern  . Not on file  Social History Narrative  . Not on file   Family History  Problem Relation Age of  Onset  . Diabetes Mother   . Diabetes Father   . Diabetes Sister    No Known Allergies Prior to Admission medications   Medication Sig Start Date End Date Taking? Authorizing Provider  Prenatal Vit-Fe Fumarate-FA (PRENATAL/FOLIC ACID) TABS Take 1 tablet by mouth daily. Patient taking differently: Take 1 tablet by mouth at bedtime.  05/12/16   Alfonse Spruce, FNP     Positive ROS: All other systems have been reviewed and were otherwise negative with the exception of those mentioned in the HPI and as above.  Physical Exam: General: Alert, no acute distress Cardiovascular: No pedal edema Respiratory: No cyanosis, no use of accessory musculature GI: abdomen soft Skin: No lesions in the area of chief complaint Neurologic: Sensation intact distally Psychiatric: Patient is competent for consent with normal mood and affect Lymphatic: no lymphedema  MUSCULOSKELETAL: exam stable  Assessment: right wrist ganglion cyst  Plan: Plan for Procedure(s): REMOVAL GANGLION OF RIGHT WRIST  The risks benefits and alternatives were discussed with the patient including but not limited to the risks of nonoperative treatment, versus surgical intervention including infection, bleeding, nerve injury,  blood clots, cardiopulmonary complications, morbidity, mortality, among others, and they were willing to proceed.   Eduard Roux,  MD   08/17/2017 7:29 AM

## 2017-08-17 NOTE — Transfer of Care (Signed)
Immediate Anesthesia Transfer of Care Note  Patient: Everest Hacking  Procedure(s) Performed: REMOVAL GANGLION OF RIGHT WRIST (Right Wrist)  Patient Location: PACU  Anesthesia Type:Bier block  Level of Consciousness: awake, sedated and patient cooperative  Airway & Oxygen Therapy: Patient Spontanous Breathing and Patient connected to face mask oxygen  Post-op Assessment: Report given to RN and Post -op Vital signs reviewed and stable  Post vital signs: Reviewed and stable  Last Vitals:  Vitals Value Taken Time  BP    Temp    Pulse    Resp    SpO2      Last Pain:  Vitals:   08/17/17 0734  TempSrc: Oral  PainSc: 0-No pain         Complications: No apparent anesthesia complications

## 2017-08-17 NOTE — Anesthesia Preprocedure Evaluation (Addendum)
Anesthesia Evaluation  Patient identified by MRN, date of birth, ID band Patient awake    Reviewed: Allergy & Precautions, NPO status , Patient's Chart, lab work & pertinent test results  Airway Mallampati: II  TM Distance: >3 FB Neck ROM: Full    Dental no notable dental hx. (+) Teeth Intact   Pulmonary neg pulmonary ROS,    Pulmonary exam normal breath sounds clear to auscultation       Cardiovascular negative cardio ROS Normal cardiovascular exam Rhythm:Regular Rate:Normal     Neuro/Psych negative neurological ROS  negative psych ROS   GI/Hepatic negative GI ROS, Neg liver ROS,   Endo/Other  negative endocrine ROS  Renal/GU negative Renal ROS  negative genitourinary   Musculoskeletal Ganglion cyst right wrist   Abdominal (+) - obese,   Peds  Hematology negative hematology ROS (+)   Anesthesia Other Findings   Reproductive/Obstetrics                            Anesthesia Physical Anesthesia Plan  ASA: I  Anesthesia Plan: Bier Block and Bier Block-LIDOCAINE ONLY   Post-op Pain Management:    Induction: Intravenous  PONV Risk Score and Plan: 2 and Ondansetron, Propofol infusion, Treatment may vary due to age or medical condition and Midazolam  Airway Management Planned: Natural Airway, Nasal Cannula and Simple Face Mask  Additional Equipment:   Intra-op Plan:   Post-operative Plan:   Informed Consent: I have reviewed the patients History and Physical, chart, labs and discussed the procedure including the risks, benefits and alternatives for the proposed anesthesia with the patient or authorized representative who has indicated his/her understanding and acceptance.   Dental advisory given  Plan Discussed with: CRNA, Anesthesiologist and Surgeon  Anesthesia Plan Comments:         Anesthesia Quick Evaluation

## 2017-08-18 ENCOUNTER — Encounter (HOSPITAL_BASED_OUTPATIENT_CLINIC_OR_DEPARTMENT_OTHER): Payer: Self-pay | Admitting: Orthopaedic Surgery

## 2017-08-31 ENCOUNTER — Ambulatory Visit (INDEPENDENT_AMBULATORY_CARE_PROVIDER_SITE_OTHER): Payer: Self-pay | Admitting: Physician Assistant

## 2017-08-31 ENCOUNTER — Encounter (INDEPENDENT_AMBULATORY_CARE_PROVIDER_SITE_OTHER): Payer: Self-pay | Admitting: Physician Assistant

## 2017-08-31 DIAGNOSIS — M67431 Ganglion, right wrist: Secondary | ICD-10-CM

## 2017-08-31 NOTE — Progress Notes (Signed)
   Post-Op Visit Note   Patient: Alyssa Wallace           Date of Birth: March 06, 1986           MRN: [id number] Visit Date: 08/31/2017 PCP: Alfonse Spruce, FNP   Assessment & Plan:  Chief Complaint: No chief complaint on file.  Visit Diagnoses:  1. Ganglion cyst of dorsum of right wrist     Plan: Patient is a pleasant 32 year old female who presents to our clinic today 14 days status post excision ganglion cyst dorsum of the right wrist, date of surgery 08/17/2017.  Doing well without pain.  No fevers, chills or any other systemic symptoms.  Examination of her right wrist reveals well-healing surgical incision with nylon sutures in place.  No evidence of infection or cellulitis.  Full range of motion.  She is neurovascular intact distally.  Today, we will remove the nylon sutures.  She will increase activity as tolerated.  Still no soaking the incision for another few weeks.  Follow-up with [place] in 4 weeks time for repeat evaluation.  However she is doing well, she can call and let us know cancel the appointment.  Call with concerns or questions in the meantime.  Follow-Up Instructions: Return in about 1 month (around 09/28/2017).   Orders:  No orders of the defined types were placed in this encounter.  No orders of the defined types were placed in this encounter.   Imaging: No results found.  PMFS History: Patient Active Problem List   Diagnosis Date Noted  . Ganglion cyst of dorsum of right wrist 08/17/2017  . PROM (premature rupture of membranes) 12/13/2016  . GBS (group B Streptococcus carrier), +RV culture, currently pregnant 12/06/2016  . Short cervix with cervical cerclage, antepartum 10/21/2016  . Supervision of high-risk pregnancy 06/14/2016  . Previous cesarean section complicating pregnancy 45/36/4680  . Previous preterm delivery x 2, antepartum 06/14/2016  . Language barrier 06/14/2016   Past Medical History:  Diagnosis Date  . Preterm labor       Family History  Problem Relation Age of Onset  . Diabetes Mother   . Diabetes Father   . Diabetes Sister     Past Surgical History:  Procedure Laterality Date  . CERVICAL CERCLAGE N/A 07/30/2016   Procedure: CERCLAGE CERVICAL;  Surgeon: Woodroe Mode, MD;  Location: [facility];  Service: Gynecology;  Laterality: N/A;  . CESAREAN SECTION  04/05/2012   Procedure: CESAREAN SECTION;  Surgeon: Woodroe Mode, MD;  Location: [facility];  Service: Obstetrics;  Laterality: N/A;  Primary Cesarean Section Delivery Girl @ [phone], Apgars 5/8  . GANGLION CYST EXCISION Right 08/17/2017   Procedure: REMOVAL GANGLION OF RIGHT WRIST;  Surgeon: Leandrew Koyanagi, MD;  Location: [facility];  Service: Orthopedics;  Laterality: Right;   Social History   Occupational History  . Not on file  Tobacco Use  . Smoking status: Never Smoker  . Smokeless tobacco: Never Used  Substance and Sexual Activity  . Alcohol use: No  . Drug use: No  . Sexual activity: Yes    Birth control/protection: Condom

## 2017-09-08 ENCOUNTER — Ambulatory Visit: Payer: Self-pay | Attending: Family Medicine | Admitting: Physician Assistant

## 2017-09-08 VITALS — BP 119/81 | HR 73 | Temp 98.4°F | Resp 16 | Ht 61.0 in | Wt 116.8 lb

## 2017-09-08 DIAGNOSIS — R05 Cough: Secondary | ICD-10-CM | POA: Insufficient documentation

## 2017-09-08 DIAGNOSIS — J301 Allergic rhinitis due to pollen: Secondary | ICD-10-CM | POA: Insufficient documentation

## 2017-09-08 DIAGNOSIS — J069 Acute upper respiratory infection, unspecified: Secondary | ICD-10-CM | POA: Insufficient documentation

## 2017-09-08 DIAGNOSIS — Z79899 Other long term (current) drug therapy: Secondary | ICD-10-CM | POA: Insufficient documentation

## 2017-09-08 DIAGNOSIS — M898X9 Other specified disorders of bone, unspecified site: Secondary | ICD-10-CM | POA: Insufficient documentation

## 2017-09-08 DIAGNOSIS — Z8751 Personal history of pre-term labor: Secondary | ICD-10-CM | POA: Insufficient documentation

## 2017-09-08 MED ORDER — CETIRIZINE HCL 10 MG PO TABS: 10.0000 mg | ORAL_TABLET | Freq: Every day | ORAL | 11 refills | Status: DC

## 2017-09-08 MED ORDER — AZITHROMYCIN 250 MG PO TABS: ORAL_TABLET | ORAL | 0 refills | Status: DC

## 2017-09-08 MED FILL — AZITHROMYCIN 250 MG TABLET: 250 | 5 days supply | Qty: 6 | Fill #0

## 2017-09-08 NOTE — Progress Notes (Signed)
Patient ID: Rylah Fukuda, female   DOB: 1986-01-21, 32 y.o.   MRN: [id number]      Laine Fonner, is a 32 y.o. female  CVE:[id number]  WCH:[id number]  DOB - December 02, 1985  Subjective:  Chief Complaint and HPI: Rosamary Boudreau is a 32 y.o. female here today  Cough X 1 month, some mucus that is yellow in the daytime and a dry cough at night. No f/c.   Itchy eyes, pain in bones.  Zyrtec helps a little but she has only taken it once.   [employer] interpreters "Katharine Look"  ROS:   Constitutional:  No f/c, No night sweats, No unexplained weight loss. EENT:  No vision changes, No blurry vision, No hearing changes. No additional mouth, throat, or ear problems.  Respiratory: + cough, No SOB Cardiac: No CP, no palpitations GI:  No abd pain, No N/V/D. GU: No Urinary s/sx Musculoskeletal: No joint pain Neuro: No headache, no dizziness, no motor weakness.  Skin: No rash Endocrine:  No polydipsia. No polyuria.  Psych: Denies SI/HI  No problems updated.  ALLERGIES: No Known Allergies  PAST MEDICAL HISTORY: Past Medical History:  Diagnosis Date  . Preterm labor     MEDICATIONS AT HOME: Prior to Admission medications   Medication Sig Start Date End Date Taking? Authorizing Provider  azithromycin (ZITHROMAX) 250 MG tablet Take 2 today then 1 daily 09/08/17   Argentina Donovan, PA-C  cetirizine (ZYRTEC) 10 MG tablet Take 1 tablet (10 mg total) by mouth daily. 09/08/17   Argentina Donovan, PA-C  HYDROcodone-acetaminophen (NORCO) 5-325 MG tablet Take 1-2 tablets by mouth every 6 (six) hours as needed. Patient not taking: Reported on 09/08/2017 08/17/17   Leandrew Koyanagi, MD  ondansetron (ZOFRAN) 4 MG tablet Take 1-2 tablets (4-8 mg total) by mouth every 8 (eight) hours as needed for nausea or vomiting. Patient not taking: Reported on 09/08/2017 08/17/17   Leandrew Koyanagi, MD  Prenatal Vit-Fe Fumarate-FA (PRENATAL/FOLIC ACID) TABS Take 1 tablet by mouth daily. Patient not taking:  Reported on 09/08/2017 05/12/16   Alfonse Spruce, FNP  promethazine (PHENERGAN) 25 MG tablet Take 1 tablet (25 mg total) by mouth every 6 (six) hours as needed for nausea. 07/19/11 07/26/11  Julianne Rice, MD  promethazine (PHENERGAN) 25 MG tablet Take 1 tablet (25 mg total) by mouth every 6 (six) hours as needed for nausea. Patient not taking: Reported on 09/08/2017 08/17/17   Leandrew Koyanagi, MD  senna-docusate (SENOKOT S) 8.6-50 MG tablet Take 1 tablet by mouth at bedtime as needed. Patient not taking: Reported on 09/08/2017 08/17/17   Leandrew Koyanagi, MD     Objective:  EXAM:   Vitals:   09/08/17 1002  BP: 119/81  Pulse: 73  Resp: 16  Temp: 98.4 F (36.9 C)  TempSrc: Oral  SpO2: 96%  Weight: 116 lb 12.8 oz (53 kg)  Height: 5\' 1"  (1.549 m)    General appearance : A&OX3. NAD. Non-toxic-appearing HEENT: Atraumatic and Normocephalic.  PERRLA. EOM intact.  TM clear B. Mouth-MMM, post pharynx WNL w/o erythema, No PND. Neck: supple, no JVD. No cervical lymphadenopathy. No thyromegaly Chest/Lungs:  Breathing-non-labored, Good air entry bilaterally, breath sounds normal without rales, rhonchi, or wheezing  CVS: S1 S2 regular, no murmurs, gallops, rubs  Extremities: Bilateral Lower Ext shows no edema, both legs are warm to touch with = pulse throughout Neurology:  CN II-XII grossly intact, Non focal.   Psych:  TP linear. J/I WNL. Normal speech. Appropriate  eye contact and affect.  Skin:  No Rash  Data Review Lab Results  Component Value Date   HGBA1C 5.4 01/08/2014     Assessment & Plan   1. Upper respiratory tract infection, unspecified type Will cover for atypicals bc productive of yellow sputum and going on for 1 month - azithromycin (ZITHROMAX) 250 MG tablet; Take 2 today then 1 daily  Dispense: 6 tablet; Refill: 0  2. Allergic rhinitis due to pollen, unspecified seasonality - cetirizine (ZYRTEC) 10 MG tablet; Take 1 tablet (10 mg total) by mouth daily.  Dispense: 30 tablet;  Refill: 11   Patient have been counseled extensively about nutrition and exercise  Return if symptoms worsen or fail to improve.  The patient was given clear instructions to go to ER or return to medical center if symptoms don't improve, worsen or new problems develop. The patient verbalized understanding. The patient was told to call to get lab results if they haven't heard anything in the next week.    Freeman Caldron, PA-C [facility] [place], [place]   09/08/2017, 10:20 AM

## 2017-09-08 NOTE — Progress Notes (Signed)
Pt. Stated she is here for cough and feeling tired all the time. Pt. Stated her cough has been going on for a month and stated she took Zyrtec OTC and helped her a little bit.

## 2018-05-16 ENCOUNTER — Ambulatory Visit: Payer: Self-pay | Attending: Family Medicine

## 2018-05-19 ENCOUNTER — Encounter: Payer: Self-pay | Admitting: Nurse Practitioner

## 2018-05-19 ENCOUNTER — Ambulatory Visit: Payer: Self-pay | Attending: Nurse Practitioner | Admitting: Nurse Practitioner

## 2018-05-19 VITALS — BP 101/68 | HR 81 | Temp 97.5°F | Ht 61.0 in | Wt 120.2 lb

## 2018-05-19 DIAGNOSIS — Z0000 Encounter for general adult medical examination without abnormal findings: Secondary | ICD-10-CM | POA: Insufficient documentation

## 2018-05-19 DIAGNOSIS — Z23 Encounter for immunization: Secondary | ICD-10-CM | POA: Insufficient documentation

## 2018-05-19 NOTE — Progress Notes (Signed)
Assessment & Plan:  Alyssa Wallace was seen today for establish care.  Diagnoses and all orders for this visit:  Routine adult health maintenance -     CBC -     CMP14+EGFR -     Lipid panel  Need for immunization against influenza -     Flu Vaccine QUAD 36+ mos IM    Patient has been counseled on age-appropriate routine health concerns for screening and prevention. These are reviewed and up-to-date. Referrals have been placed accordingly. Immunizations are up-to-date or declined.    Subjective:   Chief Complaint  Patient presents with  . Establish Care   HPI Alyssa Wallace 33 y.o. female presents to office today with husband and 1 month old daughter. VRI was used to communicate directly with patient for the entire encounter including providing detailed patient instructions.  She is breastfeeding. She is not having any menstrual cycles. She Is not taking any birth control. She has no concerns today and currently does not take any medications. Will have her return for physical and Patient has been advised to apply for financial assistance and schedule to see our financial counselor.   Review of Systems  Constitutional: Negative for fever, malaise/fatigue and weight loss.  HENT: Negative.  Negative for nosebleeds.   Eyes: Negative.  Negative for blurred vision, double vision and photophobia.  Respiratory: Negative.  Negative for cough and shortness of breath.   Cardiovascular: Negative.  Negative for chest pain, palpitations and leg swelling.  Gastrointestinal: Negative.  Negative for heartburn, nausea and vomiting.  Musculoskeletal: Negative.  Negative for myalgias.  Neurological: Negative.  Negative for dizziness, focal weakness, seizures and headaches.  Psychiatric/Behavioral: Negative.  Negative for suicidal ideas.    Past Medical History:  Diagnosis Date  . Preterm labor     Past Surgical History:  Procedure Laterality Date  . CERVICAL CERCLAGE N/A 07/30/2016   Procedure: CERCLAGE CERVICAL;  Surgeon: Alyssa Mode, MD;  Location: [facility];  Service: Gynecology;  Laterality: N/A;  . CESAREAN SECTION  04/05/2012   Procedure: CESAREAN SECTION;  Surgeon: Alyssa Mode, MD;  Location: [facility];  Service: Obstetrics;  Laterality: N/A;  Primary Cesarean Section Delivery Girl @ [phone], Apgars 5/8  . GANGLION CYST EXCISION Right 08/17/2017   Procedure: REMOVAL GANGLION OF RIGHT WRIST;  Surgeon: Alyssa Koyanagi, MD;  Location: [facility];  Service: Orthopedics;  Laterality: Right;    Family History  Problem Relation Age of Onset  . Diabetes Mother   . Diabetes Father   . Diabetes Sister     Social History Reviewed with no changes to be made today.   Outpatient Medications Prior to Visit  Medication Sig Dispense Refill  . Prenatal Vit-Fe Fumarate-FA (PRENATAL/FOLIC ACID) TABS Take 1 tablet by mouth daily. 30 each 11  . azithromycin (ZITHROMAX) 250 MG tablet Take 2 today then 1 daily (Patient not taking: Reported on 05/19/2018) 6 tablet 0  . cetirizine (ZYRTEC) 10 MG tablet Take 1 tablet (10 mg total) by mouth daily. (Patient not taking: Reported on 05/19/2018) 30 tablet 11  . HYDROcodone-acetaminophen (NORCO) 5-325 MG tablet Take 1-2 tablets by mouth every 6 (six) hours as needed. (Patient not taking: Reported on 09/08/2017) 30 tablet 0  . ondansetron (ZOFRAN) 4 MG tablet Take 1-2 tablets (4-8 mg total) by mouth every 8 (eight) hours as needed for nausea or vomiting. (Patient not taking: Reported on 09/08/2017) 40 tablet 0  . promethazine (PHENERGAN) 25 MG tablet Take 1 tablet (  25 mg total) by mouth every 6 (six) hours as needed for nausea. 30 tablet 0  . promethazine (PHENERGAN) 25 MG tablet Take 1 tablet (25 mg total) by mouth every 6 (six) hours as needed for nausea. (Patient not taking: Reported on 09/08/2017) 30 tablet 1  . senna-docusate (SENOKOT S) 8.6-50 MG tablet Take 1 tablet by mouth at bedtime as needed. (Patient not taking: Reported on 09/08/2017)  30 tablet 1   No facility-administered medications prior to visit.     No Known Allergies     Objective:    BP 101/68   Pulse 81   Temp (!) 97.5 F (36.4 C) (Oral)   Ht '5\' 1"'$  (1.549 m)   Wt 120 lb 3.2 oz (54.5 kg)   LMP 05/03/2018   SpO2 97%   BMI 22.71 kg/m  Wt Readings from Last 3 Encounters:  05/19/18 120 lb 3.2 oz (54.5 kg)  09/08/17 116 lb 12.8 oz (53 kg)  08/17/17 117 lb 8 oz (53.3 kg)    Physical Exam Vitals signs and nursing note reviewed.  Constitutional:      Appearance: She is well-developed.  HENT:     Head: Normocephalic and atraumatic.  Neck:     Musculoskeletal: Normal range of motion.  Cardiovascular:     Rate and Rhythm: Normal rate and regular rhythm.     Heart sounds: Normal heart sounds. No murmur. No friction rub. No gallop.   Pulmonary:     Effort: Pulmonary effort is normal. No tachypnea or respiratory distress.     Breath sounds: Normal breath sounds. No decreased breath sounds, wheezing, rhonchi or rales.  Chest:     Chest wall: No tenderness.  Abdominal:     General: Bowel sounds are normal.     Palpations: Abdomen is soft.  Musculoskeletal: Normal range of motion.  Skin:    General: Skin is warm and dry.  Neurological:     Mental Status: She is alert and oriented to person, place, and time.     Coordination: Coordination normal.  Psychiatric:        Behavior: Behavior normal. Behavior is cooperative.        Thought Content: Thought content normal.        Judgment: Judgment normal.          Patient has been counseled extensively about nutrition and exercise as well as the importance of adherence with medications and regular follow-up. The patient was given clear instructions to go to ER or return to medical center if symptoms don't improve, worsen or new problems develop. The patient verbalized understanding.   Follow-up: Return for Physical ONLY no labs, Needs appointment with financial representative.Alyssa Pounds,  FNP-BC [facility] [place], [place]   05/19/2018, 1:56 PM

## 2018-05-19 NOTE — Patient Instructions (Signed)
Alyssa Wallace (Health Maintenance, Female) Un estilo de vida saludable y los cuidados preventivos pueden favorecer considerablemente a la salud y Musician. Pregunte a su mdico cul es el cronograma de exmenes peridicos apropiado para usted. Esta es una buena oportunidad para consultarlo sobre cmo prevenir enfermedades y [place] sano. Adems de los controles, hay muchas otras cosas que puede hacer usted mismo. Los expertos han realizado numerosas investigaciones [employer] cambios en el estilo de vida y las medidas de prevencin que, [place], lo ayudarn a mantenerse sano. Solicite a su mdico ms informacin. EL PESO Y LA DIETA Consuma una dieta saludable.  Asegrese de [employer] verduras, frutas, productos lcteos de bajo contenido de [place] y Advertising account planner.  No consuma muchos alimentos de alto contenido de grasas slidas, azcares agregados o sal.  Realice actividad fsica con regularidad. Esta es una de las prcticas ms importantes que puede hacer por su salud. ? La Delorise Shiner de los adultos deben hacer ejercicio durante al menos 171mnutos por semana. El ejercicio debe aumentar la frecuencia cardaca y pActorla transpiracin (ejercicio de i[place]. ? La mayora de los adultos tambin deben hacer ejercicios de elongacin al mToysRusveces a la semana. Agregue esto al su plan de ejercicio de intensidad moderada. Mantenga un peso saludable.  El ndice de masa corporal ([facility] es una medida que puede utilizarse para identificar posibles problemas de pSan Diego Country Estates Proporciona una estimacin de la grasa corporal basndose en el peso y la altura. Su mdico puede ayudarle a dRadiation protection practitionerIManhassety a lScientist, forensico mTheatre managerun peso saludable.  Para las mujeres de 20aos o ms: ? Un ISt. Catherine Of Siena Medical Centermenor de 18,5 se considera bajo peso. ? Un ISt Joseph'S Women'S Hospitalentre 18,5 y 24,9 es normal. ? Un ISurgical Park Center Ltdentre 25 y 29,9 se considera sobrepeso. ? Un IMC de 30 o ms se considera  obesidad. Observe los niveles de colesterol y lpidos en la sangre.  Debe comenzar a rEnglish as a second language teacherde lpidos y cResearch officer, trade unionen la sangre a los 20aos y luego repetirlos cada 565aos  Es posible que nAutomotive engineerlos niveles de colesterol con mayor frecuencia si: ? Sus niveles de lpidos y colesterol son altos. ? Es mayor de 576BHA ? Presenta un alto riesgo de padecer enfermedades cardacas. DETECCIN DE CNCER Cncer de pulmn  Se recomienda realizar exmenes de deteccin de cncer de pulmn a personas adultas entre 547y 870aos que estn en riesgo de dHorticulturist, commercialde pulmn por sus antecedentes de consumo de tabaco.  Se recomienda una tomografa computarizada de baja dosis de los pulmones todos los aos a las personas que: ? Fuman actualmente. ? Hayan dejado el hbito en algn momento en los ltimos 15aos. ? Hayan fumado durante 30aos un paquete diario. Un paquete-ao equivale a fumar un promedio de un paquete de cigarrillos diario durante un ao.  Los exmenes de deteccin anuales deben continuar hasta que hayan pasado 15aos desde que dej de fumar.  Ya no debern realizarse si tiene un problema de salud que le impida recibir tratamiento para eScience writerde pulmn. Cncer de mama  Practique la autoconciencia de la mama. Esto significa reconocer la apariencia normal de sus mamas y cmo las siente.  Tambin significa realizar autoexmenes regulares de lJohnson & Johnson Informe a su mdico sobre cualquier cambio, sin importar cun pequeo sea.  Si tiene entre 20 y 371aos, un mdico debe realizarle un examen clnico de las mamas como parte del examen regular de sHyde cada 1 a  3aos.  Si tiene 40aos o ms, debe realizarse un examen clnico de las Microsoft. Tambin considere realizarse una [place]) todos los [place].  Si tiene antecedentes familiares de cncer de mama, hable con su mdico para someterse a un estudio gentico.  Si  tiene alto riesgo de Chief Financial Officer de mama, hable con su mdico para someterse a Public house manager y [employer].  La evaluacin del gen del cncer de mama (BRCA) se recomienda a mujeres que tengan familiares con cnceres relacionados con el BRCA. Los cnceres relacionados con el BRCA incluyen los siguientes: ? Aguilita. ? Ovario. ? Trompas. ? Cnceres de peritoneo.  Los resultados de la evaluacin determinarn la necesidad de asesoramiento gentico y de [place] de BRCA1 y BRCA2. Cncer de cuello del tero El mdico puede recomendarle que se haga pruebas peridicas de deteccin de cncer de los rganos de la pelvis (ovarios, tero y vagina). Estas pruebas incluyen un examen plvico, que abarca controlar si se produjeron cambios microscpicos en la superficie del cuello del tero (prueba de Papanicolaou). Pueden recomendarle que se haga estas pruebas cada 3aos, a partir de los 21aos.  A las mujeres que tienen entre 30 y 75aos, los mdicos pueden recomendarles que se sometan a exmenes plvicos y pruebas de Papanicolaou cada 81aos, o a la prueba de Papanicolaou y el examen plvico en combinacin con estudios de deteccin del virus del papiloma humano (VPH) cada 5aos. Algunos tipos de VPH aumentan el riesgo de Chief Financial Officer de cuello del tero. La prueba para la deteccin del VPH tambin puede realizarse a mujeres de cualquier edad cuyos resultados de la prueba de Papanicolaou no sean claros.  Es posible que otros mdicos no recomienden exmenes de deteccin a mujeres no embarazadas que se consideran sujetos de bajo riesgo de Chief Financial Officer de pelvis y que no tienen sntomas. Pregntele al mdico si un examen plvico de deteccin es adecuado para usted.  Si ha recibido un tratamiento para Science writer cervical o una enfermedad que podra causar cncer, necesitar realizarse una prueba de Papanicolaou y controles durante al menos 72 aos de concluido el [place]. Si no se  ha hecho el Papanicolaou con regularidad, debern volver a evaluarse los factores de riesgo (como tener un nuevo compaero sexual), para Teacher, adult education si debe realizarse los estudios nuevamente. Algunas mujeres sufren problemas mdicos que aumentan la probabilidad de Museum/gallery curator cncer de cuello del tero. En estos casos, el mdico podr [employer]COMM se realicen controles y pruebas de Papanicolaou con ms frecuencia. Cncer colorrectal  Este tipo de cncer puede detectarse y a menudo prevenirse.  Por lo general, los estudios de rutina se deben Medical laboratory scientific officer a Field seismologist a Proofreader de los 27 aos y [place] 55 aos.  Sin embargo, el mdico podr aconsejarle que lo haga antes, si tiene factores de riesgo para el cncer de colon.  Tambin puede recomendarle que use un kit de prueba para Hydrologist en la materia fecal.  Es posible que se use una pequea cmara en el extremo de un tubo para examinar directamente el colon (sigmoidoscopia o colonoscopia) a fin de Hydrographic surveyor formas tempranas de cncer colorrectal.  Los exmenes de rutina generalmente comienzan a los 46aos.  El examen directo del colon se debe repetir cada 5 a 10aos hasta los 75aos. Sin embargo, es posible que se realicen exmenes con mayor frecuencia, si se detectan formas tempranas de plipos precancerosos o pequeos bultos. Cncer de piel  Revise la piel  de la cabeza a los pies con regularidad.  Informe a su mdico si aparecen nuevos lunares o los que tiene se modifican, especialmente en su forma y color.  Tambin notifique al mdico si tiene un lunar que es ms grande que el tamao de una goma de lpiz.  Siempre use pantalla solar. Aplique pantalla solar de Kerry Dory y repetida a lo largo del Training and development officer.  Protjase usando mangas y [employer], un sombrero de ala ancha y gafas para el sol, siempre que se encuentre en el exterior. ENFERMEDADES CARDACAS, DIABETES E HIPERTENSIN ARTERIAL  La hipertensin arterial causa  enfermedades cardacas y [place] el riesgo de ictus. La hipertensin arterial es ms probable en los siguientes casos: ? Las personas que tienen la presin arterial en el extremo del rango normal (100-139/85-89 mm Hg). ? [employer] con sobrepeso u obesidad. ? Scientist, water quality.  Si usted tiene entre 18 y 39 aos, debe medirse la presin arterial cada 3 a 5 aos. Si usted tiene 40 aos o ms, debe medirse la presin arterial [employer]. Debe medirse la presin arterial dos veces: una vez cuando est en un hospital o una clnica y la otra vez cuando est en otro sitio. Registre el promedio de [employer]. Para controlar su presin arterial cuando no est en un hospital o [place], puede usar lo siguiente: ? Jorje Guild automtica para medir la presin arterial en una farmacia. ? Un monitor para medir la presin arterial en el hogar.  Si tiene entre 62 y 61 aos, consulte a su mdico si debe tomar aspirina para prevenir el ictus.  Realcese exmenes de deteccin de la diabetes con regularidad. Esto incluye la toma de [place] de sangre para controlar el nivel de azcar en la sangre durante el [place]. ? Si tiene un peso normal y un bajo riesgo de padecer diabetes, realcese este anlisis cada tres aos despus de los 45aos. ? Si tiene sobrepeso y un alto riesgo de padecer diabetes, considere someterse a este anlisis antes o con mayor frecuencia. PREVENCIN DE INFECCIONES HepatitisB  Si tiene un riesgo ms alto de Museum/gallery curator hepatitis B, debe someterse a un examen de deteccin de este virus. Se considera que tiene un alto riesgo de contraer hepatitis B si: ? Naci en un pas donde la hepatitis B es frecuente. Pregntele a su mdico qu pases son considerados de Public affairs consultant. ? Sus padres nacieron en un pas de alto riesgo y usted no recibi una vacuna que lo proteja contra la hepatitis B (vacuna contra la hepatitis B). ? [place]. ? [place] agujas para inyectarse  drogas. ? Vive con alguien que tiene hepatitis B. ? Ha tenido sexo con alguien que tiene hepatitis B. ? Recibe tratamiento de hemodilisis. ? Toma ciertos medicamentos para el cncer, trasplante de rganos y afecciones autoinmunitarias. Hepatitis C  Se recomienda un anlisis de [place] para: ? [employer] 1945 y 1965. ? Todas las personas que tengan un riesgo de haber contrado hepatitis C. Enfermedades de transmisin sexual (ETS).  Debe realizarse pruebas de deteccin de enfermedades de transmisin sexual (ETS), incluidas gonorrea y clamidia si: ? Es sexualmente activo y es menor de 17GYF. ? Es mayor de 24aos, y Investment banker, operational informa que corre riesgo de tener este tipo de infecciones. ? La actividad sexual ha cambiado desde que le hicieron la ltima prueba de deteccin y tiene un riesgo mayor de Best boy clamidia o Radio broadcast assistant. Pregntele al mdico si usted  tiene riesgo.  Si no tiene el VIH, pero corre riesgo de infectarse por el virus, se recomienda tomar diariamente un medicamento recetado para evitar la infeccin. Esto se conoce como profilaxis previa a la exposicin. Se considera que est en riesgo si: ? Es [place] sexualmente y no [place] preservativos habitualmente o no conoce el estado del VIH de sus Advertising copywriter. ? Se inyecta drogas. ? Es [place] sexualmente con Ardelia Mems pareja que tiene VIH. Consulte a su mdico para saber si tiene un alto riesgo de infectarse por el VIH. Si opta por comenzar la profilaxis previa a la exposicin, primero debe realizarse anlisis de deteccin del VIH. Luego, le harn anlisis cada 32mses mientras est tomando los medicamentos para la profilaxis previa a la exposicin. EM[facility] Surgery [facility] Si es premenopusica y puede quedar eMontrose solicite a su mdico asesoramiento previo a la concepcin.  Si puede quedar embarazada, tome 400 a [id number](mcg) de cido fAnheuser-Busch  Si desea evitar el embarazo, hable con su mdico sobre el  control de la natalidad (anticoncepcin). OSTEOPOROSIS Y MENOPAUSIA  La osteoporosis es una enfermedad en la que los huesos pierden los minerales y la fuerza por el avance de la edad. El resultado pueden ser fracturas graves en los hNew [place] El riesgo de osteoporosis puede identificarse con uArdelia Memsprueba de densidad sea.  Si tiene 65aos o ms, o si est en riesgo de sufrir osteoporosis y fracturas, pregunte a su mdico si debe someterse a exmenes.  Consulte a su mdico si debe tomar un suplemento de calcio o de vitamina D para reducir el riesgo de osteoporosis.  La menopausia puede presentar ciertos sntomas fsicos y rGaffer  La terapia de reemplazo hormonal puede reducir algunos de estos sntomas y rGaffer Consulte a su mdico para saber si la terapia de reemplazo hormonal es conveniente para usted. INSTRUCCIONES PARA EL CUIDADO EN EL HOGAR  Realcese los estudios de rutina de la salud, dentales y de lPublic librarian  MCity of the Sun  No consuma ningn producto que contenga tabaco, lo que incluye cigarrillos, tabaco de mHigher education careers advisero cPsychologist, sport and exercise  Si est embarazada, no beba alcohol.  Si est amamantando, reduzca el consumo de alcohol y la frecuencia con la que consume.  Si es mujer y no est embarazada limite el consumo de alcohol a no ms de 1 medida por da. Una medida equivale a 12onzas de cerveza, 5onzas de vino o 1onzas de bebidas alcohlicas de alta graduacin.  No consuma drogas.  No comparta agujas.  Solicite ayuda a su mdico si necesita apoyo o informacin para abandonar las drogas.  Informe a su mdico si a menudo se siente deprimido.  Notifique a su mdico si alguna vez ha sido vctima de abuso o si no se siente seguro en su hogar. Esta informacin no tiene cMarine scientistel consejo del mdico. Asegrese de hacerle al mdico cualquier pregunta que tenga. Document Released: 04/15/2011 Document Revised: 05/17/2014 Document Reviewed:  01/28/2015 Elsevier Interactive Patient Education  2019 [employer]Reyn[employer]

## 2018-05-20 LAB — CMP14+EGFR
ALT: 12 IU/L (ref 0–32)
AST: 16 IU/L (ref 0–40)
Albumin/Globulin Ratio: 1.8 (ref 1.2–2.2)
Albumin: 4.4 g/dL (ref 3.5–5.5)
Alkaline Phosphatase: 60 IU/L (ref 39–117)
BUN/Creatinine Ratio: 27 — ABNORMAL HIGH (ref 9–23)
BUN: 15 mg/dL (ref 6–20)
Bilirubin Total: 0.3 mg/dL (ref 0.0–1.2)
CO2: 24 mmol/L (ref 20–29)
CREATININE: 0.55 mg/dL — AB (ref 0.57–1.00)
Calcium: 9.2 mg/dL (ref 8.7–10.2)
Chloride: 102 mmol/L (ref 96–106)
GFR calc Af Amer: 143 mL/min/{1.73_m2} (ref >59)
GFR, EST NON AFRICAN AMERICAN: 124 mL/min/{1.73_m2} (ref >59)
GLOBULIN, TOTAL: 2.5 g/dL (ref 1.5–4.5)
GLUCOSE: 81 mg/dL (ref 65–99)
Potassium: 4.4 mmol/L (ref 3.5–5.2)
SODIUM: 141 mmol/L (ref 134–144)
TOTAL PROTEIN: 6.9 g/dL (ref 6.0–8.5)

## 2018-05-20 LAB — CBC
HEMOGLOBIN: 13.7 g/dL (ref 11.1–15.9)
Hematocrit: 41.2 % (ref 34.0–46.6)
MCH: 32.7 pg (ref 26.6–33.0)
MCHC: 33.3 g/dL (ref 31.5–35.7)
MCV: 98 fL — ABNORMAL HIGH (ref 79–97)
PLATELETS: 264 10*3/uL (ref 150–450)
RBC: 4.19 x10E6/uL (ref 3.77–5.28)
RDW: 11.7 % (ref 11.7–15.4)
WBC: 5.4 10*3/uL (ref 3.4–10.8)

## 2018-05-20 LAB — LIPID PANEL
CHOL/HDL RATIO: 2.5 ratio (ref 0.0–4.4)
Cholesterol, Total: 164 mg/dL (ref 100–199)
HDL: 66 mg/dL (ref >39)
LDL CALC: 84 mg/dL (ref 0–99)
TRIGLYCERIDES: 69 mg/dL (ref 0–149)
VLDL CHOLESTEROL CAL: 14 mg/dL (ref 5–40)

## 2018-05-26 ENCOUNTER — Telehealth (INDEPENDENT_AMBULATORY_CARE_PROVIDER_SITE_OTHER): Payer: Self-pay

## 2018-05-26 NOTE — Telephone Encounter (Signed)
-----   Message from Gildardo Pounds, NP sent at 05/25/2018  9:19 PM EST ----- Labs are essentially normal including your kidney and liver function. Cholesterol levels are good.

## 2018-05-26 NOTE — Telephone Encounter (Signed)
Call placed using pacific interpreter [phone]) patient is aware that labs are normal including liver and kidney function. Cholesterol levels good. Patient did not have any questions and expressed understanding. Alyssa Wallace Latrelle Dodrill

## 2018-08-02 ENCOUNTER — Encounter: Payer: Self-pay | Admitting: Nurse Practitioner

## 2018-08-19 NOTE — Telephone Encounter (Signed)
error 

## 2018-10-04 ENCOUNTER — Encounter: Payer: Self-pay | Admitting: Nurse Practitioner

## 2018-12-04 ENCOUNTER — Encounter: Payer: Self-pay | Admitting: Nurse Practitioner

## 2019-08-23 ENCOUNTER — Encounter: Payer: Self-pay | Admitting: *Deleted

## 2020-07-11 ENCOUNTER — Telehealth: Payer: Self-pay | Admitting: Nurse Practitioner

## 2020-07-11 NOTE — Telephone Encounter (Signed)
Called Pt with interpreter services. Provider is out of the office 3/8 but working virtually. Pt appt is for a physical and will need to reschedule an in-person appt. Interpreter left vm for Patient to call [phone] to reschedule appt.

## 2020-07-15 ENCOUNTER — Encounter: Payer: Self-pay | Admitting: Nurse Practitioner

## 2020-07-18 LAB — CYTOLOGY - PAP: Pap Smear: NEGATIVE

## 2020-07-25 ENCOUNTER — Ambulatory Visit: Payer: Self-pay | Attending: Nurse Practitioner

## 2020-07-25 ENCOUNTER — Other Ambulatory Visit: Payer: Self-pay

## 2020-08-20 ENCOUNTER — Encounter: Payer: Self-pay | Admitting: Nurse Practitioner

## 2020-08-20 ENCOUNTER — Other Ambulatory Visit: Payer: Self-pay

## 2020-08-20 ENCOUNTER — Ambulatory Visit: Payer: Self-pay | Attending: Nurse Practitioner | Admitting: Nurse Practitioner

## 2020-08-20 VITALS — BP 107/69 | HR 59 | Resp 16 | Ht 61.0 in | Wt 127.6 lb

## 2020-08-20 DIAGNOSIS — Z1159 Encounter for screening for other viral diseases: Secondary | ICD-10-CM

## 2020-08-20 DIAGNOSIS — Z114 Encounter for screening for human immunodeficiency virus [HIV]: Secondary | ICD-10-CM

## 2020-08-20 DIAGNOSIS — Z131 Encounter for screening for diabetes mellitus: Secondary | ICD-10-CM

## 2020-08-20 DIAGNOSIS — Z13228 Encounter for screening for other metabolic disorders: Secondary | ICD-10-CM

## 2020-08-20 DIAGNOSIS — Z130 Encounter for screening for diseases of the blood and blood-forming organs and certain disorders involving the immune mechanism: Secondary | ICD-10-CM

## 2020-08-20 DIAGNOSIS — Z13220 Encounter for screening for lipoid disorders: Secondary | ICD-10-CM

## 2020-08-20 NOTE — Progress Notes (Signed)
MENSTRUAL CYCLE is on. Will need to reschedule for PAP

## 2020-08-21 LAB — CMP14+EGFR
ALT: 16 IU/L (ref 0–32)
AST: 15 IU/L (ref 0–40)
Albumin/Globulin Ratio: 2 (ref 1.2–2.2)
Albumin: 4.9 g/dL — ABNORMAL HIGH (ref 3.8–4.8)
Alkaline Phosphatase: 58 IU/L (ref 44–121)
BUN/Creatinine Ratio: 20 (ref 9–23)
BUN: 11 mg/dL (ref 6–20)
Bilirubin Total: 0.2 mg/dL (ref 0.0–1.2)
CO2: 20 mmol/L (ref 20–29)
Calcium: 9.4 mg/dL (ref 8.7–10.2)
Chloride: 102 mmol/L (ref 96–106)
Creatinine, Ser: 0.55 mg/dL — ABNORMAL LOW (ref 0.57–1.00)
Globulin, Total: 2.4 g/dL (ref 1.5–4.5)
Glucose: 90 mg/dL (ref 65–99)
Potassium: 4.6 mmol/L (ref 3.5–5.2)
Sodium: 140 mmol/L (ref 134–144)
Total Protein: 7.3 g/dL (ref 6.0–8.5)
eGFR: 123 mL/min/{1.73_m2} (ref >59)

## 2020-08-21 LAB — CBC
Hematocrit: 43.2 % (ref 34.0–46.6)
Hemoglobin: 14.8 g/dL (ref 11.1–15.9)
MCH: 33 pg (ref 26.6–33.0)
MCHC: 34.3 g/dL (ref 31.5–35.7)
MCV: 96 fL (ref 79–97)
Platelets: 267 10*3/uL (ref 150–450)
RBC: 4.48 x10E6/uL (ref 3.77–5.28)
RDW: 12 % (ref 11.7–15.4)
WBC: 4.5 10*3/uL (ref 3.4–10.8)

## 2020-08-21 LAB — LIPID PANEL
Chol/HDL Ratio: 3.3 ratio (ref 0.0–4.4)
Cholesterol, Total: 210 mg/dL — ABNORMAL HIGH (ref 100–199)
HDL: 64 mg/dL (ref >39)
LDL Chol Calc (NIH): 137 mg/dL — ABNORMAL HIGH (ref 0–99)
Triglycerides: 53 mg/dL (ref 0–149)
VLDL Cholesterol Cal: 9 mg/dL (ref 5–40)

## 2020-08-21 LAB — HCV AB W REFLEX TO QUANT PCR: HCV Ab: <0.1 s/co ratio (ref 0.0–0.9)

## 2020-08-21 LAB — HEMOGLOBIN A1C
Est. average glucose Bld gHb Est-mCnc: 114 mg/dL
Hgb A1c MFr Bld: 5.6 % (ref 4.8–5.6)

## 2020-08-21 LAB — HIV ANTIBODY (ROUTINE TESTING W REFLEX): HIV Screen 4th Generation wRfx: NONREACTIVE

## 2020-08-21 LAB — HCV INTERPRETATION

## 2020-11-13 ENCOUNTER — Encounter (HOSPITAL_COMMUNITY): Payer: Self-pay | Admitting: Emergency Medicine

## 2020-11-13 ENCOUNTER — Ambulatory Visit: Payer: Self-pay | Admitting: *Deleted

## 2020-11-13 ENCOUNTER — Emergency Department (HOSPITAL_COMMUNITY)
Admission: EM | Admit: 2020-11-13 | Discharge: 2020-11-13 | Disposition: A | Discharge status: Home / Self Care | Payer: Self-pay | Attending: Emergency Medicine | Admitting: Emergency Medicine

## 2020-11-13 DIAGNOSIS — Z28310 Unvaccinated for covid-19: Secondary | ICD-10-CM | POA: Insufficient documentation

## 2020-11-13 DIAGNOSIS — Z5321 Procedure and treatment not carried out due to patient leaving prior to being seen by health care provider: Secondary | ICD-10-CM | POA: Insufficient documentation

## 2020-11-13 DIAGNOSIS — U071 COVID-19: Secondary | ICD-10-CM | POA: Insufficient documentation

## 2020-11-13 LAB — SARS CORONAVIRUS 2 (TAT 6-24 HRS): SARS Coronavirus 2: POSITIVE — AB

## 2020-11-13 NOTE — Telephone Encounter (Signed)
Reason for Disposition . [1] MODERATE weakness (i.e., interferes with work, school, normal activities) AND [2] cause unknown  (Exceptions: weakness with acute minor illness, or weakness from poor fluid intake)  Answer Assessment - Initial Assessment Questions 1. DESCRIPTION: "Describe how you are feeling."     Weak- sleeping alot 2. SEVERITY: "How bad is it?"  "Can you stand and walk?"   - MILD - Feels weak or tired, but does not interfere with work, school or normal activities   - [place] to stand and walk; weakness interferes with work, school, or normal activities   - SEVERE - Unable to stand or walk     moderate 3. ONSET:  "When did the weakness begin?"     3 days ago 4. CAUSE: "What do you think is causing the weakness?"     unsure 5. MEDICINES: "Have you recently started a new medicine or had a change in the amount of a medicine?"     no 6. OTHER SYMPTOMS: "Do you have any other symptoms?" (e.g., chest pain, fever, cough, SOB, vomiting, diarrhea, bleeding, other areas of pain)     Sore throat, cough 7. PREGNANCY: "Is there any chance you are pregnant?" "When was your last menstrual period?"     No- LMP-7/1  Answer Assessment - Initial Assessment Questions 1. TEMPERATURE: "What is the most recent temperature?"  "How was it measured?"      Unable to measure- no thermometer 2. ONSET: "When did the fever start?"      3 days  Protocols used: Fever-A-AH, Weakness (Generalized) and Fatigue-A-AH

## 2020-11-13 NOTE — ED Triage Notes (Signed)
Patient complains of headaches and generalized body aches for the last three days. Denies shortness of breath, diarrhea, vomiting. States she did not received the COVID vaccine, has not been around anyone sick that she knows of. Patient alert, oriented, ambulatory and in no apparent distress at this time.

## 2020-11-13 NOTE — ED Provider Notes (Signed)
  Physical Exam  BP 99/82   Pulse 99   Temp 99.4 F (37.4 C) (Oral)   Resp 16   SpO2 96%   Physical Exam  ED Course/Procedures     Procedures  MDM  Unclear whether or not this patient was still in the emergency department.  I called her to inform her that she did in fact have COVID-19.  She speaks only [place], and she asked me to call her husband.  I called her husband and conveyed this information to him as well as instructions on symptomatic management and quarantine.       Arnaldo Natal, MD 11/13/20 Thom Chimes

## 2020-11-13 NOTE — ED Provider Notes (Signed)
Emergency Medicine Provider Triage Evaluation Note  Alyssa Wallace , a 35 y.o. female  was evaluated in triage.  Pt complains of body aches, sore throat, headache x3 days.  Not COVID vaccinated.  Also reports subjective fevers.  Review of Systems  Positive: Fevers, body aches, sore throat Negative: Chest pain, shortness of breath  Physical Exam  BP 118/86   Pulse 94   Temp 99.4 F (37.4 C) (Oral)   Resp 14   SpO2 100%  Gen:   Awake, no distress   Resp:  Normal effort  MSK:   Moves extremities without difficulty  Other:    Medical Decision Making  Medically screening exam initiated at 11:07 AM.  Appropriate orders placed.  Christel Bai was informed that the remainder of the evaluation will be completed by another provider, this initial triage assessment does not replace that evaluation, and the importance of remaining in the ED until their evaluation is complete.     Sherrill Raring, PA-C 11/13/20 1108    Daleen Bo, MD 11/15/20 [phone]

## 2020-11-13 NOTE — Telephone Encounter (Signed)
Patient is calling to report she has fever( unable to measure), fatigue/weakness, sore throat, slight cough. Advised home COVID test- patient does not have. Advised virtual visit- patient states she has no access, advised UC for evaluation. Patient has orange card and wants to know if that will cover visit- advised not sure about orange card rules.

## 2021-03-24 ENCOUNTER — Telehealth: Payer: Self-pay | Admitting: Nurse Practitioner

## 2021-03-24 NOTE — Telephone Encounter (Signed)
Copied from [place] [phone]. Topic: General - Other >> Mar 24, 2021 12:53 PM Bayard Beaver wrote: Reason for FUQ:[id number] needs to schedule appt for orange card Please call back

## 2021-03-24 NOTE — Telephone Encounter (Signed)
Return Pt call schedule a financial appt only for the OC

## 2021-03-31 ENCOUNTER — Ambulatory Visit: Payer: Self-pay | Attending: Nurse Practitioner

## 2021-03-31 ENCOUNTER — Other Ambulatory Visit: Payer: Self-pay

## 2021-05-10 NOTE — L&D Delivery Note (Addendum)
OB/GYN Faculty Practice Delivery Note  Alyssa Wallace is a 36 y.o. [phone] s/p spontaneous VBAC at [redacted]w[redacted]d She was admitted for spontaneous labor.   ROM: 7h 086mith clear fluid GBS Status: negative Maximum Maternal Temperature: 97.32F  Labor Progress: Uncomplicated  Delivery Date/Time: 01/11/2022 '@0519'$  Delivery: Called to room and patient was complete and pushing. Head delivered OA, with restitution to maternal left. No nuchal cord present. Shoulder and body delivered in usual fashion. Infant with spontaneous cry, placed on mother's abdomen, dried and stimulated. Cord clamped x 2 after 1-minute delay, and cut by FOB under my direct supervision. Cord blood drawn. Placenta delivered spontaneously with gentle cord traction. Fundus firm with massage and Pitocin. Labia, perineum, vagina, and cervix were inspected, 1st degree perineal laceration. Repaired with continuous 3.0 Monocryl suture. CNM WiJimmye Normanresent and gloved for the duration of delivery.  Placenta: complete, three vessel cord appreciated Complications: None Lacerations: 1st degree midline perineal, repaired EBL: 131 mL Analgesia: Local lidocaine  Postpartum Planning Message to sent to schedule follow-up  Vaccines UTD  Infant: Female  APGARs 9/9  Not yet weighed  AlJanice NorrieMD PGY3 [facility] I confirm that I have verified the information documented in the resident's note and that I have also personally reperformed the physical exam and all medical decision making activities.   I was gloved and present for entire delivery SVD without incident No difficulty with shoulders Lacerations as listed above Repair of same supervised by me MaSeabron SpatesCNM

## 2021-05-27 ENCOUNTER — Other Ambulatory Visit: Payer: Self-pay

## 2021-05-27 ENCOUNTER — Encounter: Payer: Self-pay | Admitting: Nurse Practitioner

## 2021-05-27 ENCOUNTER — Ambulatory Visit: Payer: Self-pay | Attending: Nurse Practitioner | Admitting: Nurse Practitioner

## 2021-05-27 VITALS — BP 99/67 | HR 74 | Resp 16 | Wt 126.4 lb

## 2021-05-27 DIAGNOSIS — Z0000 Encounter for general adult medical examination without abnormal findings: Secondary | ICD-10-CM

## 2021-05-27 NOTE — Progress Notes (Signed)
Alyssa Wallace was seen today for annual exam.  Diagnoses and all orders for this visit:  Encounter for annual physical exam -     CMP14+EGFR -     Hemoglobin A1c -     CBC      Patient has been counseled on age-appropriate routine health concerns for screening and prevention. These are reviewed and up-to-date. Referrals have been placed accordingly. Immunizations are up-to-date or declined.    Subjective:   Chief Complaint  Patient presents with   Annual Exam   HPI Alyssa Wallace 36 y.o. female presents to office today for annual physical.   Review of Systems  Constitutional:  Negative for fever, malaise/fatigue and weight loss.  HENT: Negative.  Negative for nosebleeds.   Eyes: Negative.  Negative for blurred vision, double vision and photophobia.  Respiratory: Negative.  Negative for cough and shortness of breath.   Cardiovascular: Negative.  Negative for chest pain, palpitations and leg swelling.  Gastrointestinal: Negative.  Negative for heartburn, nausea and vomiting.  Genitourinary: Negative.   Musculoskeletal: Negative.  Negative for myalgias.  Skin: Negative.   Neurological: Negative.  Negative for dizziness, focal weakness, seizures and headaches.  Endo/Heme/Allergies: Negative.   Psychiatric/Behavioral: Negative.  Negative for suicidal ideas.    Past Medical History:  Diagnosis Date   Preterm labor     Past Surgical History:  Procedure Laterality Date   CERVICAL CERCLAGE N/A 07/30/2016   Procedure: CERCLAGE CERVICAL;  Surgeon: Woodroe Mode, MD;  Location: [facility];  Service: Gynecology;  Laterality: N/A;   CESAREAN SECTION  04/05/2012   Procedure: CESAREAN SECTION;  Surgeon: Woodroe Mode, MD;  Location: [facility];  Service: Obstetrics;  Laterality: N/A;  Primary Cesarean Section Delivery Girl @ [phone], Apgars 5/8   GANGLION CYST EXCISION Right 08/17/2017   Procedure: REMOVAL GANGLION OF RIGHT WRIST;  Surgeon: Leandrew Koyanagi, MD;  Location: [facility];  Service: Orthopedics;  Laterality: Right;    Family History  Problem Relation Age of Onset   Diabetes Mother    Diabetes Father    Diabetes Sister     Social History Reviewed with no changes to be made today.   No outpatient medications prior to visit.   No facility-administered medications prior to visit.    No Known Allergies     Objective:    BP 99/67    Pulse 74    Resp 16    Wt 126 lb 6.4 oz (57.3 kg)    SpO2 99%    BMI 23.88 kg/m  Wt Readings from Last 3 Encounters:  05/27/21 126 lb 6.4 oz (57.3 kg)  08/20/20 127 lb 9.6 oz (57.9 kg)  05/19/18 120 lb 3.2 oz (54.5 kg)    Physical Exam Vitals and nursing note reviewed.  Constitutional:      Appearance: She is well-developed.  HENT:     Head: Normocephalic and atraumatic.     Right Ear: External ear normal.     Left Ear: External ear normal.     Nose: Nose normal.     Mouth/Throat:     Pharynx: No oropharyngeal exudate.  Eyes:     General: No scleral icterus.       Right eye: No discharge.     Conjunctiva/sclera: Conjunctivae normal.     Pupils: Pupils are equal, round, and reactive to light.  Neck:     Thyroid: No thyromegaly.     Trachea: No tracheal deviation.  Cardiovascular:  Rate and Rhythm: Normal rate and regular rhythm.     Heart sounds: Normal heart sounds. No murmur heard.   No friction rub. No gallop.  Pulmonary:     Effort: Pulmonary effort is normal. No tachypnea, accessory muscle usage or respiratory distress.     Breath sounds: Normal breath sounds. No decreased breath sounds, wheezing, rhonchi or rales.  Abdominal:     General: Bowel sounds are normal. There is no distension.     Palpations: Abdomen is soft. There is no mass.     Tenderness: There is no abdominal tenderness. There is no guarding or rebound.  Musculoskeletal:        General: No tenderness or deformity. Normal range of motion.     Cervical back: Normal range of motion and neck supple.  Lymphadenopathy:      Cervical: No cervical adenopathy.  Skin:    General: Skin is warm and dry.     Findings: No erythema.  Neurological:     Mental Status: She is alert and oriented to person, place, and time.     Cranial Nerves: No cranial nerve deficit.     Coordination: Coordination normal.     Deep Tendon Reflexes: Reflexes are normal and symmetric.  Psychiatric:        Speech: Speech normal.        Behavior: Behavior normal. Behavior is cooperative.        Thought Content: Thought content normal.        Judgment: Judgment normal.         Patient has been counseled extensively about nutrition and exercise as well as the importance of adherence with medications and regular follow-up. The patient was given clear instructions to go to ER or return to medical center if symptoms don't improve, worsen or new problems develop. The patient verbalized understanding.   Follow-up: Return for Schedule PAP smear .   Alyssa Pounds, FNP-BC [facility] [place], [place]   05/27/2021, 4:48 PM

## 2021-05-28 LAB — CMP14+EGFR
ALT: 14 IU/L (ref 0–32)
AST: 15 IU/L (ref 0–40)
Albumin/Globulin Ratio: 2 (ref 1.2–2.2)
Albumin: 4.4 g/dL (ref 3.8–4.8)
Alkaline Phosphatase: 53 IU/L (ref 44–121)
BUN/Creatinine Ratio: 22 (ref 9–23)
BUN: 17 mg/dL (ref 6–20)
Bilirubin Total: <0.2 mg/dL (ref 0.0–1.2)
CO2: 22 mmol/L (ref 20–29)
Calcium: 9.1 mg/dL (ref 8.7–10.2)
Chloride: 100 mmol/L (ref 96–106)
Creatinine, Ser: 0.76 mg/dL (ref 0.57–1.00)
Globulin, Total: 2.2 g/dL (ref 1.5–4.5)
Glucose: 73 mg/dL (ref 70–99)
Potassium: 4 mmol/L (ref 3.5–5.2)
Sodium: 138 mmol/L (ref 134–144)
Total Protein: 6.6 g/dL (ref 6.0–8.5)
eGFR: 104 mL/min/{1.73_m2} (ref >59)

## 2021-05-28 LAB — CBC
Hematocrit: 38.5 % (ref 34.0–46.6)
Hemoglobin: 13.4 g/dL (ref 11.1–15.9)
MCH: 33.9 pg — ABNORMAL HIGH (ref 26.6–33.0)
MCHC: 34.8 g/dL (ref 31.5–35.7)
MCV: 98 fL — ABNORMAL HIGH (ref 79–97)
Platelets: 231 10*3/uL (ref 150–450)
RBC: 3.95 x10E6/uL (ref 3.77–5.28)
RDW: 11.8 % (ref 11.7–15.4)
WBC: 5.7 10*3/uL (ref 3.4–10.8)

## 2021-05-28 LAB — HEMOGLOBIN A1C
Est. average glucose Bld gHb Est-mCnc: 120 mg/dL
Hgb A1c MFr Bld: 5.8 % — ABNORMAL HIGH (ref 4.8–5.6)

## 2021-06-04 ENCOUNTER — Telehealth: Payer: Self-pay

## 2021-06-04 NOTE — Telephone Encounter (Signed)
Called patient reviewed all information and repeated back to me. Will call if any questions.  With the help of Micheline Maze #  [phone]

## 2021-06-04 NOTE — Telephone Encounter (Signed)
-----   Message from Gildardo Pounds, NP sent at 06/02/2021  9:29 PM EST ----- A1c in prediabetes range. Diet should be low carb low cholesterol.   Kidney, liver function and electrolytes are normal.    CBC does not indicate any anemia or bleeding disorders

## 2021-07-09 LAB — OB RESULTS CONSOLE ABO/RH: RH Type: POSITIVE

## 2021-07-09 LAB — OB RESULTS CONSOLE ANTIBODY SCREEN: Antibody Screen: NEGATIVE

## 2021-07-09 LAB — OB RESULTS CONSOLE HGB/HCT, BLOOD
HCT: 40 (ref 29–41)
Hemoglobin: 13

## 2021-07-09 LAB — OB RESULTS CONSOLE VARICELLA ZOSTER ANTIBODY, IGG: Varicella: NON-IMMUNE/NOT IMMUNE

## 2021-07-09 LAB — OB RESULTS CONSOLE RPR: RPR: NONREACTIVE

## 2021-07-09 LAB — OB RESULTS CONSOLE GC/CHLAMYDIA
Chlamydia: NEGATIVE
Gonorrhea: NEGATIVE

## 2021-07-09 LAB — OB RESULTS CONSOLE PLATELET COUNT: Platelets: 291

## 2021-07-09 LAB — URINE CULTURE
Glucose 1 Hour: 194
Urine Culture, OB: NEGATIVE

## 2021-07-09 LAB — OB RESULTS CONSOLE HIV ANTIBODY (ROUTINE TESTING): HIV: NONREACTIVE

## 2021-07-09 LAB — OB RESULTS CONSOLE RUBELLA ANTIBODY, IGM: Rubella: IMMUNE

## 2021-07-09 LAB — OB RESULTS CONSOLE HEPATITIS B SURFACE ANTIGEN: Hepatitis B Surface Ag: NEGATIVE

## 2021-07-09 LAB — HEPATITIS C ANTIBODY: HCV Ab: NEGATIVE

## 2021-07-24 ENCOUNTER — Encounter: Payer: Self-pay | Admitting: *Deleted

## 2021-07-24 DIAGNOSIS — O0990 Supervision of high risk pregnancy, unspecified, unspecified trimester: Secondary | ICD-10-CM

## 2021-07-24 DIAGNOSIS — O09219 Supervision of pregnancy with history of pre-term labor, unspecified trimester: Secondary | ICD-10-CM

## 2021-07-24 DIAGNOSIS — Z758 Other problems related to medical facilities and other health care: Secondary | ICD-10-CM

## 2021-07-24 DIAGNOSIS — Z555 Less than a high school diploma: Secondary | ICD-10-CM | POA: Insufficient documentation

## 2021-07-24 DIAGNOSIS — O24419 Gestational diabetes mellitus in pregnancy, unspecified control: Secondary | ICD-10-CM | POA: Insufficient documentation

## 2021-07-24 DIAGNOSIS — Z789 Other specified health status: Secondary | ICD-10-CM

## 2021-07-30 ENCOUNTER — Ambulatory Visit (INDEPENDENT_AMBULATORY_CARE_PROVIDER_SITE_OTHER): Payer: Self-pay | Admitting: Family Medicine

## 2021-07-30 ENCOUNTER — Encounter: Payer: Self-pay | Admitting: Family Medicine

## 2021-07-30 ENCOUNTER — Other Ambulatory Visit: Payer: Self-pay

## 2021-07-30 VITALS — BP 100/65 | HR 73 | Wt 123.1 lb

## 2021-07-30 DIAGNOSIS — O24410 Gestational diabetes mellitus in pregnancy, diet controlled: Secondary | ICD-10-CM

## 2021-07-30 DIAGNOSIS — O09219 Supervision of pregnancy with history of pre-term labor, unspecified trimester: Secondary | ICD-10-CM

## 2021-07-30 DIAGNOSIS — Z789 Other specified health status: Secondary | ICD-10-CM

## 2021-07-30 DIAGNOSIS — O0990 Supervision of high risk pregnancy, unspecified, unspecified trimester: Secondary | ICD-10-CM

## 2021-07-30 DIAGNOSIS — O09529 Supervision of elderly multigravida, unspecified trimester: Secondary | ICD-10-CM | POA: Insufficient documentation

## 2021-07-30 DIAGNOSIS — O34219 Maternal care for unspecified type scar from previous cesarean delivery: Secondary | ICD-10-CM

## 2021-07-30 DIAGNOSIS — O09522 Supervision of elderly multigravida, second trimester: Secondary | ICD-10-CM

## 2021-07-30 MED ORDER — ASPIRIN EC 81 MG PO TBEC: 81.0000 mg | DELAYED_RELEASE_TABLET | Freq: Every day | ORAL | 3 refills | Status: DC

## 2021-07-30 MED ORDER — PROGESTERONE 200 MG PO CAPS: 200.0000 mg | ORAL_CAPSULE | Freq: Every day | ORAL | 3 refills | Status: DC

## 2021-07-30 NOTE — Progress Notes (Signed)
? ?  PRENATAL VISIT NOTE ? ?Transfer of care from [facility] due to prior PTB x 2 and GDM A2/B ? ?Subjective:  ?Alyssa Wallace is a 36 y.o. [phone] at 90w4dbeing seen today [facility]  She is currently monitored for the following issues for this high-risk pregnancy and has Previous cesarean section complicating pregnancy; Previous preterm delivery x 2, antepartum; Language barrier; Ganglion cyst of dorsum of right wrist; Supervision of high risk pregnancy, antepartum; Gestational diabetes; Has less than high school education; and AMA (advanced maternal age) multigravida 35+ on their problem list. ? ?Patient reports no complaints.  Contractions: Not present. Vag. Bleeding: None.   . Denies leaking of fluid.  ? ?The following portions of the patient's history were reviewed and updated as appropriate: allergies, current medications, past family history, past medical history, past social history, past surgical history and problem list.  ? ?Objective:  ? ?Vitals:  ? 07/30/21 1410  ?BP: 100/65  ?Pulse: 73  ?Weight: 123 lb 1.6 oz (55.8 kg)  ? ? ?Fetal Status: Fetal Heart Rate (bpm): 155        ? ?General:  Alert, oriented and cooperative. Patient is in no acute distress.  ?Skin: Skin is warm and dry. No rash noted.   ?Cardiovascular: Normal heart rate noted  ?Respiratory: Normal respiratory effort, no problems with respiration noted  ?Abdomen: Soft, gravid, appropriate for gestational age.  Pain/Pressure: Absent     ?Pelvic: Cervical exam deferred        ?Extremities: Normal range of motion.  Edema: None  ?Mental Status: Normal mood and affect. Normal behavior. Normal judgment and thought content.  ? ?Assessment and Plan:  ?Pregnancy: [id number] 132w4d1. Supervision of high risk pregnancy, antepartum ?Needs anatomy [place]- US[place]FM OB DETAIL +14 WK; Future ? ?2. Diet controlled gestational diabetes mellitus (GDM) in second trimester ?Needs to see diabetes educator ?A1C in January was  5.8 ?Baseline labs ?ASA ?- TSH ?- Comprehensive metabolic panel ?- Protein / creatinine ratio, urine ?- aspirin EC 81 MG tablet; Take 1 tablet (81 mg total) by mouth daily.  Dispense: 90 tablet; Refill: 3 ? ?3. Previous cesarean section complicating pregnancy ?P1 SVD ?P2 CS for labor breech ?P3 VBAC ?Desires TOLAC ? ?4. Previous preterm delivery x 2, antepartum ?P1 PPROM 34 weeks ?P2 PPROM 33 5/7 weeks ?P3 38 weeks on 17 P and delivered at term ?Given unavailability of Makena, will give vaginal prometrium from 16-36 weeks ?- progesterone (PROMETRIUM) 200 MG capsule; Place 1 capsule (200 mg total) vaginally daily.  Dispense: 90 capsule; Refill: 3 ? ?5. Language barrier ?Spanish interpreter: Eda used ? ? ?6. Multigravida of advanced maternal age in second trimester ?[facility] ?General obstetric precautions including but not limited to vaginal bleeding, contractions, leaking of fluid and fetal movement were reviewed in detail with the patient. ?Please refer to After Visit Summary for other counseling recommendations.  ? ?Return in 4 weeks (on 08/27/2021) [facility], diabetes educator asap. ? ?Future Appointments  ?Date Time Provider [place]?08/13/2021 11:15 AM WMC-EDUCATION W[facility]  ?08/27/2021 10:55 AM ArWoodroe ModeMD [facility]?08/31/2021  9:30 AM WMC-MFC NURSE [facility]  ?08/31/2021  9:45 AM WMC-MFC US5 WMC-MFCUS WMC  ? ? ?TaDonnamae JudeMD ? ?

## 2021-07-30 NOTE — Patient Instructions (Addendum)
?  Inserci?n de prometrio en la vagina todas las noches de 16 a 36 semanas. ? ?Seguir una dieta [place] y mantener el nivel de az?car en la sangre bajo control es lo m?s importante para su salud y la de su beb? por Associate Professor. ? ?Por favor revise su nivel de az?car en la sangre 4 veces al d?a. ? ?Mantenga registros BS precisos y ll?velos con usted a cada visita. Por favor traiga su medidor tambi?n. ? ?Los objetivos para el nivel de az?car en la sangre deben ser: ?1. El ayuno (a primera hora de la ma?ana antes de comer) debe ser inferior a 90. ?2. 2 horas despu?s de las comidas debe ser inferior a 120. ? ?Coma 3 comidas y 3 refrigerios. Incluya prote?nas (carne, l?cteos, queso, huevos, nueces) en todas las comidas. ? ?Tenga en cuenta que los carbohidratos aumentan el nivel de az?car en la [place]. No solo alimentos dulces (galletas, pasteles, donas, frutas, jugos, refrescos), sino tambi?n pan, pasta, arroz y papas. Tienes que limitar la cantidad de carbohidratos que est?s comiendo. ? ?Agregar ejercicio, tan solo 30 minutos al d?a, puede disminuir el nivel de az?car en la [place]. ? ?. ? ?

## 2021-07-31 ENCOUNTER — Other Ambulatory Visit: Payer: Self-pay | Admitting: *Deleted

## 2021-07-31 DIAGNOSIS — O24410 Gestational diabetes mellitus in pregnancy, diet controlled: Secondary | ICD-10-CM

## 2021-07-31 LAB — COMPREHENSIVE METABOLIC PANEL
ALT: 15 IU/L (ref 0–32)
AST: 13 IU/L (ref 0–40)
Albumin/Globulin Ratio: 2 (ref 1.2–2.2)
Albumin: 4.2 g/dL (ref 3.8–4.8)
Alkaline Phosphatase: 49 IU/L (ref 44–121)
BUN/Creatinine Ratio: 24 — ABNORMAL HIGH (ref 9–23)
BUN: 10 mg/dL (ref 6–20)
Bilirubin Total: <0.2 mg/dL (ref 0.0–1.2)
CO2: 22 mmol/L (ref 20–29)
Calcium: 9.3 mg/dL (ref 8.7–10.2)
Chloride: 101 mmol/L (ref 96–106)
Creatinine, Ser: 0.41 mg/dL — ABNORMAL LOW (ref 0.57–1.00)
Globulin, Total: 2.1 g/dL (ref 1.5–4.5)
Glucose: 104 mg/dL — ABNORMAL HIGH (ref 70–99)
Potassium: 3.9 mmol/L (ref 3.5–5.2)
Sodium: 136 mmol/L (ref 134–144)
Total Protein: 6.3 g/dL (ref 6.0–8.5)
eGFR: 131 mL/min/{1.73_m2} (ref >59)

## 2021-07-31 LAB — PROTEIN / CREATININE RATIO, URINE
Creatinine, Urine: 14.1 mg/dL
Protein, Ur: <4 mg/dL

## 2021-07-31 LAB — TSH: TSH: 1.53 u[IU]/mL (ref 0.450–4.500)

## 2021-08-13 ENCOUNTER — Ambulatory Visit (INDEPENDENT_AMBULATORY_CARE_PROVIDER_SITE_OTHER): Payer: Self-pay | Admitting: Registered"

## 2021-08-13 ENCOUNTER — Encounter: Payer: Self-pay | Attending: Family Medicine | Admitting: Registered"

## 2021-08-13 DIAGNOSIS — O24410 Gestational diabetes mellitus in pregnancy, diet controlled: Secondary | ICD-10-CM

## 2021-08-13 DIAGNOSIS — Z3A00 Weeks of gestation of pregnancy not specified: Secondary | ICD-10-CM | POA: Insufficient documentation

## 2021-08-13 NOTE — Progress Notes (Signed)
AMN Video Interpreter Steele Sizer [phone] ? ?Patient was seen for Gestational Diabetes self-management on 08/13/21  ?Start time 1120 and End time 1215 ? ?Estimated due date: 01/24/22; [redacted]w[redacted]d? ?Clinical: ?Medications: aspirin ?Medical History: reviewed ?Labs: OGTT (cannot find in chart), A1c 5.8%  ? ?Dietary and Lifestyle History: ?Pt states she has changed her diet a lot a doing as she was told: eat small portions, only whole grain bread and rice. Pt states she still cooks white rice for children, but has followed directions for her diet to protect pregnancy.  ? ?Pt states prior to pregnancy she was doing zumba but for concern of safety during pregnancy has stopped all physical activity. ? ?Physical Activity: ADL ?Stress: not assessed ?Sleep: not assessed ? ?24 hr Recall:  ?First Meal: 2 eggs, 1 whole wheat toast, avocado, bell peppers, milk ?Second meal: quesadilla, beef, lettuce, tomato, cucumber ?Third meal: corn flakes, 1/2 banana, glass of milk, almonds and cashews (states she really enjoys this meal) ?Snacks throughout the day: fruit  ?Beverages: water, milk ? ?NUTRITION INTERVENTION  ?Nutrition education (E-1) on the following topics:  ? ?Initial Follow-up ? ?'[x]'$  '[]'$  Definition of Gestational Diabetes ?'[]'$  '[]'$  Why dietary management is important in controlling blood glucose ?'[x]'$  '[]'$  Effects each nutrient has on blood glucose levels ?'[]'$  '[]'$  Simple carbohydrates vs complex carbohydrates ?'[]'$  '[]'$  Fluid intake ?'[x]'$  '[]'$  Creating a balanced meal plan ?'[x]'$  '[]'$  Carbohydrate counting  ?'[x]'$  '[]'$  When to check blood glucose levels ?'[x]'$  '[]'$  Proper blood glucose monitoring techniques ?'[x]'$  '[]'$  Effect of stress and stress reduction techniques  ?'[x]'$  '[]'$  Exercise effect on blood glucose levels, appropriate exercise during pregnancy ?'[]'$  '[]'$  Importance of limiting caffeine and abstaining from alcohol and smoking ?'[]'$  '[]'$  Medications used for blood sugar control during pregnancy ?'[]'$  '[]'$  Hypoglycemia and rule of 15 ?'[]'$  '[]'$  Postpartum self care ? ?Blood  glucose monitor given: Prodigy ?Lot # [id number] ?CBG: 98 mg/dL ? ?Patient instructed to monitor glucose levels: ?FBS: 60 - ? 95 mg/dL (some clinics use 90 for cutoff) ?1 hour: ? 140 mg/dL ?2 hour: ? 120 mg/dL ? ?Patient received handouts: ?Nutrition Diabetes and Pregnancy ?Carbohydrate Counting List ? ?Patient will be seen for follow-up as needed.  ?

## 2021-08-27 ENCOUNTER — Ambulatory Visit (INDEPENDENT_AMBULATORY_CARE_PROVIDER_SITE_OTHER): Payer: Self-pay | Admitting: Registered"

## 2021-08-27 ENCOUNTER — Encounter: Payer: Self-pay | Admitting: Registered"

## 2021-08-27 ENCOUNTER — Ambulatory Visit (INDEPENDENT_AMBULATORY_CARE_PROVIDER_SITE_OTHER): Payer: Self-pay | Admitting: Obstetrics & Gynecology

## 2021-08-27 VITALS — BP 114/76 | HR 84 | Wt 124.6 lb

## 2021-08-27 DIAGNOSIS — O24410 Gestational diabetes mellitus in pregnancy, diet controlled: Secondary | ICD-10-CM

## 2021-08-27 DIAGNOSIS — O0990 Supervision of high risk pregnancy, unspecified, unspecified trimester: Secondary | ICD-10-CM

## 2021-08-27 DIAGNOSIS — O34219 Maternal care for unspecified type scar from previous cesarean delivery: Secondary | ICD-10-CM

## 2021-08-27 DIAGNOSIS — O09522 Supervision of elderly multigravida, second trimester: Secondary | ICD-10-CM

## 2021-08-27 NOTE — Patient Instructions (Signed)

## 2021-08-27 NOTE — Progress Notes (Signed)
[place] office notified to send fetal echo order, provider note, and demographics to [facility].  ? Apolonio Schneiders RN ?08/27/21 ?

## 2021-08-27 NOTE — Progress Notes (Signed)
AMN Video Interpreter [phone] ? ?Patient was seen for follow-up Gestational Diabetes self-management ?08/27/21  ?Start time 0920 and End time 0950 ? ?Estimated due date: 01/24/22; [redacted]w[redacted]d? ?Clinical: ?Medications: aspirin ?Medical History: reviewed ?Labs: OGTT (cannot find in chart), A1c 5.8%  ? ?SMBG x2 weeks: FBS: WNL; PPBG: 3 values above normal ? ?Dietary and Lifestyle History: ?Pt states she was scared when she saw a reading of 135 mg/dL. Pt reports eating more rice in that meal than she usually does, 1 1/2 c instead of just one cup.  ? ?Pt reports hypoglycemic symptoms occasionally before meals but does not checked BG. No low values indicated on log sheet. ? ?Pt reports she is planning to breastfeed and had concerns about being hungry, from prior experience of breastfeeding. ? ?NUTRITION INTERVENTION  ?Nutrition education (E-1) on the following topics:  ? ?Initial Follow-up ? ?'[x]'$  '[]'$  Definition of Gestational Diabetes ?'[]'$  '[]'$  Why dietary management is important in controlling blood glucose ?'[x]'$  '[]'$  Effects each nutrient has on blood glucose levels ?'[]'$  '[]'$  Simple carbohydrates vs complex carbohydrates ?'[]'$  '[x]'$  Fluid intake ?'[x]'$  '[]'$  Creating a balanced meal plan ?'[x]'$  '[]'$  Carbohydrate counting  ?'[x]'$  '[]'$  When to check blood glucose levels ?'[x]'$  '[]'$  Proper blood glucose monitoring techniques ?'[x]'$  '[]'$  Effect of stress and stress reduction techniques  ?'[x]'$  '[]'$  Exercise effect on blood glucose levels, appropriate exercise during pregnancy ?'[]'$  '[]'$  Importance of limiting caffeine and abstaining from alcohol and smoking ?'[]'$  '[]'$  Medications used for blood sugar control during pregnancy ?'[]'$  '[x]'$  Hypoglycemia and rule of 15 ?'[]'$  '[x]'$  Postpartum self care ? ? ?Patient instructed to monitor glucose levels: ?FBS: 60 - ? 95 mg/dL (some clinics use 90 for cutoff) ?1 hour: ? 140 mg/dL ?2 hour: ? 120 mg/dL ? ?Patient received handouts: ?Hypoglycemia (Spanish) ? ?Patient will be seen for follow-up as needed.  ?

## 2021-08-27 NOTE — Progress Notes (Signed)
? ?  PRENATAL VISIT NOTE ? ?Subjective:  ?Alyssa Wallace is a 36 y.o. [phone] at 32w4dbeing seen today for ongoing prenatal care.  She is currently monitored for the following issues for this high-risk pregnancy and has Previous cesarean section complicating pregnancy; Previous preterm delivery x 2, antepartum; Language barrier; Ganglion cyst of dorsum of right wrist; Supervision of high risk pregnancy, antepartum; Gestational diabetes; Has less than high school education; and AMA (advanced maternal age) multigravida 35+ on their problem list. ? ?Patient reports no complaints.  Contractions: Not present. Vag. Bleeding: None.  Movement: Present. Denies leaking of fluid.  ? ?The following portions of the patient's history were reviewed and updated as appropriate: allergies, current medications, past family history, past medical history, past social history, past surgical history and problem list.  ? ?Objective:  ? ?Vitals:  ? 08/27/21 1008  ?BP: 114/76  ?Pulse: 84  ?Weight: 124 lb 9.6 oz (56.5 kg)  ? ? ?Fetal Status: Fetal Heart Rate (bpm): 152   Movement: Present    ? ?General:  Alert, oriented and cooperative. Patient is in no acute distress.  ?Skin: Skin is warm and dry. No rash noted.   ?Cardiovascular: Normal heart rate noted  ?Respiratory: Normal respiratory effort, no problems with respiration noted  ?Abdomen: Soft, gravid, appropriate for gestational age.  Pain/Pressure: Absent     ?Pelvic: Cervical exam deferred        ?Extremities: Normal range of motion.  Edema: None  ?Mental Status: Normal mood and affect. Normal behavior. Normal judgment and thought content.  ? ?Assessment and Plan:  ?Pregnancy: [id number] 112w4d1. Supervision of high risk pregnancy, antepartum ?Doing well ? ?2. Multigravida of advanced maternal age in second trimester ?Nl genetic screen ? ?3. Previous cesarean section complicating pregnancy ?VBAC. Diabetes with good control with diet ? ?Preterm labor symptoms and general  obstetric precautions including but not limited to vaginal bleeding, contractions, leaking of fluid and fetal movement were reviewed in detail with the patient. ?Please refer to After Visit Summary for other counseling recommendations.  ? ?Return in about 4 weeks (around 09/24/2021). ? ?Future Appointments  ?Date Time Provider [place]?08/27/2021 10:55 AM ArWoodroe ModeMD [facility]?08/31/2021  8:45 AM WMC-MFC NURSE W[facility]-MFC W[facility]  ?08/31/2021  9:00 AM WMC-MFC US1 WMC-MFCUS WMC  ? ? ?JaEmeterio ReeveMD ?

## 2021-08-31 ENCOUNTER — Other Ambulatory Visit: Payer: Self-pay | Admitting: *Deleted

## 2021-08-31 ENCOUNTER — Ambulatory Visit: Payer: Self-pay | Attending: Family Medicine

## 2021-08-31 ENCOUNTER — Ambulatory Visit: Payer: Self-pay | Admitting: *Deleted

## 2021-08-31 ENCOUNTER — Other Ambulatory Visit: Payer: Self-pay | Admitting: Family Medicine

## 2021-08-31 ENCOUNTER — Ambulatory Visit (HOSPITAL_BASED_OUTPATIENT_CLINIC_OR_DEPARTMENT_OTHER): Payer: Self-pay | Admitting: Obstetrics

## 2021-08-31 VITALS — BP 101/61 | HR 80

## 2021-08-31 DIAGNOSIS — O09292 Supervision of pregnancy with other poor reproductive or obstetric history, second trimester: Secondary | ICD-10-CM | POA: Insufficient documentation

## 2021-08-31 DIAGNOSIS — O24410 Gestational diabetes mellitus in pregnancy, diet controlled: Secondary | ICD-10-CM

## 2021-08-31 DIAGNOSIS — O09522 Supervision of elderly multigravida, second trimester: Secondary | ICD-10-CM

## 2021-08-31 DIAGNOSIS — O24419 Gestational diabetes mellitus in pregnancy, unspecified control: Secondary | ICD-10-CM

## 2021-08-31 DIAGNOSIS — O0990 Supervision of high risk pregnancy, unspecified, unspecified trimester: Secondary | ICD-10-CM

## 2021-08-31 DIAGNOSIS — Z3A19 19 weeks gestation of pregnancy: Secondary | ICD-10-CM | POA: Insufficient documentation

## 2021-08-31 DIAGNOSIS — Z555 Less than a high school diploma: Secondary | ICD-10-CM

## 2021-08-31 NOTE — Progress Notes (Signed)
MFM Note ? ?Alyssa Wallace was seen for a detailed fetal anatomy scan due to advanced maternal age and history of two prior preterm births at 33+ weeks and 34 weeks.  She was recently diagnosed with diet-controlled gestational diabetes.  Due to her history of prior preterm births, she is currently treated with daily vaginal progesterone. ? ?She denies any significant past medical history and denies any problems in her current pregnancy.   ? ?She had a cell free DNA test which indicated a low risk for trisomy 21, 18, and 13. A female fetus is predicted.  ? ?She was informed that the fetal growth and amniotic fluid level were appropriate for her gestational age.  ? ?There were no obvious fetal anomalies noted on today's ultrasound exam.  However, today's exam was limited due to the fetal position. ?   ?A transvaginal ultrasound performed today showed a cervical length of 2.65 cm long without any signs of funneling. ? ?The patient was informed that anomalies may be missed due to technical limitations. If the fetus is in a suboptimal position or maternal habitus is increased, visualization of the fetus in the maternal uterus may be impaired. ? ?The following were discussed during today's consultation: ? ?Advanced maternal age in pregnancy ? ?The increased risk of fetal aneuploidy due to advanced maternal age was discussed.  ? ?Due to advanced maternal age, the patient was offered and declined an amniocentesis today for definitive diagnosis of fetal aneuploidy.  She is comfortable with her negative cell free DNA test.  ? ?History of 2 prior preterm births ? ?The patient was reassured that based on her normal cervical length, her risk of a preterm birth is low at this time. ? ?She was advised to continue treatment with daily vaginal progesterone for the duration of her pregnancy. ? ?Diet-controlled gestational diabetes ? ?The implications and management of diabetes in pregnancy was discussed in detail with the  patient.   ? ?She was advised to continue to monitor her fingersticks 4 times daily (fasting and 2 hours after each meal).   ? ?She was advised that our goals for her fingerstick values are fasting values of 90-95 or less and two-hour postprandial values of 120 or less.   ? ?Should the majority of her fingerstick results be above these values, she may have to be started on metformin or insulin to help her achieve better glycemic control.  ? ?The patient was advised that getting her fingerstick values as close to these goals as possible would provide her with the most optimal obstetrical outcome. ? ?The patient was advised that delivery for well-controlled diabetes in pregnancy is usually recommended at around 39 weeks.  Delivery at 37 weeks may be considered should her glycemic control be poor. ? ?The patient stated that all of her questions were answered today. ? ?A follow-up exam was scheduled in 4 weeks to complete the views of the fetal anatomy and to assess the fetal growth.   ? ?All conversations were held with the patient today with the help of a Spanish interpreter. ? ?A total of 30 minutes was spent counseling and coordinating the care for this patient.  Greater than 50% of the time was spent in direct face-to-face contact.  ?

## 2021-09-01 ENCOUNTER — Encounter: Payer: Self-pay | Admitting: Family Medicine

## 2021-09-24 ENCOUNTER — Ambulatory Visit (INDEPENDENT_AMBULATORY_CARE_PROVIDER_SITE_OTHER): Payer: Self-pay | Admitting: Obstetrics and Gynecology

## 2021-09-24 VITALS — BP 100/66 | HR 75 | Wt 127.0 lb

## 2021-09-24 DIAGNOSIS — O09219 Supervision of pregnancy with history of pre-term labor, unspecified trimester: Secondary | ICD-10-CM

## 2021-09-24 DIAGNOSIS — O0990 Supervision of high risk pregnancy, unspecified, unspecified trimester: Secondary | ICD-10-CM

## 2021-09-24 DIAGNOSIS — O24410 Gestational diabetes mellitus in pregnancy, diet controlled: Secondary | ICD-10-CM

## 2021-09-24 DIAGNOSIS — Z3A22 22 weeks gestation of pregnancy: Secondary | ICD-10-CM

## 2021-09-24 DIAGNOSIS — O4442 Low lying placenta NOS or without hemorrhage, second trimester: Secondary | ICD-10-CM

## 2021-09-24 DIAGNOSIS — O09522 Supervision of elderly multigravida, second trimester: Secondary | ICD-10-CM

## 2021-09-24 DIAGNOSIS — Z98891 History of uterine scar from previous surgery: Secondary | ICD-10-CM

## 2021-09-24 NOTE — Progress Notes (Signed)
   PRENATAL VISIT NOTE  Subjective:  Alyssa Wallace is a 36 y.o. [phone] at 36w4dbeing seen today for ongoing prenatal care.  She is currently monitored for the following issues for this high-risk pregnancy and has History of VBAC; Previous preterm delivery x 2, antepartum; Language barrier; Low lying placenta nos or without hemorrhage, second trimester; Ganglion cyst of dorsum of right wrist; Supervision of high risk pregnancy, antepartum; Gestational diabetes; Has less than high school education; and AMA (advanced maternal age) multigravida 35+ on their problem list.  Patient reports no complaints.  Contractions: Not present. Vag. Bleeding: None.  Movement: Present. Denies leaking of fluid.   The following portions of the patient's history were reviewed and updated as appropriate: allergies, current medications, past family history, past medical history, past social history, past surgical history and problem list.   Objective:   Vitals:   09/24/21 0943  BP: 100/66  Pulse: 75  Weight: 127 lb (57.6 kg)    Fetal Status: Fetal Heart Rate (bpm): 145 Fundal Height: 23 cm Movement: Present     General:  Alert, oriented and cooperative. Patient is in no acute distress.  Skin: Skin is warm and dry. No rash noted.   Cardiovascular: Normal heart rate noted  Respiratory: Normal respiratory effort, no problems with respiration noted  Abdomen: Soft, gravid, appropriate for gestational age.  Pain/Pressure: Absent     Pelvic: Cervical exam deferred        Extremities: Normal range of motion.  Edema: None  Mental Status: Normal mood and affect. Normal behavior. Normal judgment and thought content.   Assessment and Plan:  Pregnancy: [id number] 232w4d. [redacted] weeks gestation of pregnancy Routine care  2. Diet controlled gestational diabetes mellitus (GDM) in second trimester Normal CBG values given. F/u growth u/s next week  3. Supervision of high risk pregnancy, antepartum  4. Previous  preterm delivery x 2, antepartum Continue on vag progesterone. Normal CL last month  5. History of VBAC Needs consent later in pregnancy  6. Low lying placenta nos or without hemorrhage, second trimester 1cm from os, posterior. MAU precautions and pelvic rest precautions given  7. Multigravida of advanced maternal age in second trimester  Interpreter used  Preterm labor symptoms and general obstetric precautions including but not limited to vaginal bleeding, contractions, leaking of fluid and fetal movement were reviewed in detail with the patient. Please refer to After Visit Summary for other counseling recommendations.   Return in about 2 weeks (around 10/08/2021) for in person, md or app, high risk ob.  Future Appointments  Date Time Provider De[place]5/23/2023  8:15 AM W[facility]-MFC [facility]SE [facility]5/23/2023  8:30 AM WMC-MFC US2 WMC-MFCUS WMC    ChAletha HalimMD

## 2021-09-29 ENCOUNTER — Encounter: Payer: Self-pay | Admitting: *Deleted

## 2021-09-29 ENCOUNTER — Ambulatory Visit: Payer: Self-pay | Attending: Obstetrics

## 2021-09-29 ENCOUNTER — Other Ambulatory Visit: Payer: Self-pay | Admitting: *Deleted

## 2021-09-29 ENCOUNTER — Ambulatory Visit: Payer: Self-pay | Admitting: *Deleted

## 2021-09-29 VITALS — BP 91/57 | HR 74

## 2021-09-29 DIAGNOSIS — Z555 Less than a high school diploma: Secondary | ICD-10-CM | POA: Insufficient documentation

## 2021-09-29 DIAGNOSIS — O24410 Gestational diabetes mellitus in pregnancy, diet controlled: Secondary | ICD-10-CM

## 2021-09-29 DIAGNOSIS — Z3A23 23 weeks gestation of pregnancy: Secondary | ICD-10-CM

## 2021-09-29 DIAGNOSIS — O09292 Supervision of pregnancy with other poor reproductive or obstetric history, second trimester: Secondary | ICD-10-CM

## 2021-09-29 DIAGNOSIS — O24419 Gestational diabetes mellitus in pregnancy, unspecified control: Secondary | ICD-10-CM | POA: Insufficient documentation

## 2021-09-29 DIAGNOSIS — O09522 Supervision of elderly multigravida, second trimester: Secondary | ICD-10-CM | POA: Insufficient documentation

## 2021-09-29 DIAGNOSIS — O4402 Placenta previa specified as without hemorrhage, second trimester: Secondary | ICD-10-CM

## 2021-09-29 DIAGNOSIS — O0990 Supervision of high risk pregnancy, unspecified, unspecified trimester: Secondary | ICD-10-CM | POA: Insufficient documentation

## 2021-09-29 DIAGNOSIS — O09212 Supervision of pregnancy with history of pre-term labor, second trimester: Secondary | ICD-10-CM

## 2021-10-08 ENCOUNTER — Ambulatory Visit (INDEPENDENT_AMBULATORY_CARE_PROVIDER_SITE_OTHER): Payer: Self-pay | Admitting: Obstetrics and Gynecology

## 2021-10-08 ENCOUNTER — Encounter: Payer: Self-pay | Admitting: Obstetrics and Gynecology

## 2021-10-08 VITALS — BP 95/71 | HR 75 | Wt 128.4 lb

## 2021-10-08 DIAGNOSIS — Z98891 History of uterine scar from previous surgery: Secondary | ICD-10-CM

## 2021-10-08 DIAGNOSIS — Z789 Other specified health status: Secondary | ICD-10-CM

## 2021-10-08 DIAGNOSIS — O4442 Low lying placenta NOS or without hemorrhage, second trimester: Secondary | ICD-10-CM

## 2021-10-08 DIAGNOSIS — O09219 Supervision of pregnancy with history of pre-term labor, unspecified trimester: Secondary | ICD-10-CM

## 2021-10-08 DIAGNOSIS — O0990 Supervision of high risk pregnancy, unspecified, unspecified trimester: Secondary | ICD-10-CM

## 2021-10-08 DIAGNOSIS — O24410 Gestational diabetes mellitus in pregnancy, diet controlled: Secondary | ICD-10-CM

## 2021-10-08 DIAGNOSIS — O09522 Supervision of elderly multigravida, second trimester: Secondary | ICD-10-CM

## 2021-10-08 NOTE — Progress Notes (Signed)
Pt has Glucose logs today.

## 2021-10-08 NOTE — Patient Instructions (Signed)
Segundo trimestre de embarazo Second Trimester of Pregnancy  El segundo trimestre de embarazo va desde la semana 13 hasta la semana 27. Tambin se dice que va desde el mes 4 hasta el mes 6 de embarazo. Este suele ser el momento en el que mejor se siente. Durante el segundo trimestre: Las nuseas del embarazo han disminuido o han desaparecido. Usted puede tener ms energa. Usted puede tener hambre con ms frecuencia. En esta poca, el beb en gestacin (feto) crece muy rpido. Hacia el final del sexto mes, el beb en gestacin puede medir aproximadamente 12 pulgadas y pesar alrededor de 1 libras. Es probable que comience a sentir que el beb se mueve entre las 16 y las 20 semanas de embarazo. Cambios en el cuerpo durante el segundo trimestre Su organismo contina atravesando por muchos cambios durante este perodo. Los cambios varan y generalmente vuelven a la normalidad despus del nacimiento del beb. Cambios fsicos Aumentar ms peso. Podrn aparecer las primeras estras en las caderas, el vientre (abdomen) y las mamas. Las mamas crecern y pueden doler. Pueden aparecer zonas oscuras o manchas en el rostro. Es posible que se forme una lnea oscura desde el ombligo hasta la zona del pubis (linea nigra). Tal vez haya cambios en el cabello. Cambios en la salud Es posible que tenga dolores de cabeza. Es posible que tenga acidez estomacal. Es posible que tenga dificultades para defecar (estreimiento). Es posible que tenga hemorroides o venas abultadas e hinchadas (venas varicosas). Las encas pueden sangrarle. Es posible que haga pis (orine) con mayor frecuencia. Puede sentir dolor en la espalda. Siga estas instrucciones en su casa: Medicamentos Use los medicamentos de venta libre y los recetados solamente como se lo haya indicado el mdico. Algunos medicamentos no son seguros durante el embarazo. Tome vitaminas prenatales que contengan por lo menos 600 microgramos (mcg) de cido  flico. Comida y bebida Consuma comidas saludables que incluyan lo siguiente: Frutas y verduras frescas. Cereales integrales. Buenas fuentes de protenas, como carne, huevos y tofu. Productos lcteos con bajo contenido de grasa. Evite la carne cruda y el jugo, la leche y el queso sin pasteurizar. Es posible que deba tomar medidas para prevenir o tratar los problemas para defecar: Beber suficiente lquido para mantener el pis (orina) de color amarillo plido. Come alimentos ricos en fibra. Entre ellos, frijoles, cereales integrales y frutas y verduras frescas. Limitar los alimentos con alto contenido de grasa y azcar. Estos incluyen alimentos fritos o dulces. Actividad Haga ejercicios solamente como se lo haya indicado el mdico. La mayora de las personas pueden realizar su actividad fsica habitual durante el embarazo. Intente realizar como mnimo 30 minutos de actividad fsica por lo menos 5 das a la semana. Deje de hacer ejercicio si tiene dolor o clicos en el vientre o en la zona lumbar. No haga ejercicio si hace demasiado calor, hay demasiada humedad o se encuentra en un lugar de mucha altura (altitud elevada). Evite levantar pesos excesivos. Si lo desea, puede continuar teniendo relaciones sexuales, a menos que el mdico le indique lo contrario. Alivio del dolor y del malestar Use un sostn que le brinde buen soporte si le duelen las mamas. Dese baos de asiento con agua tibia para aliviar el dolor o las molestias causadas por las hemorroides. Use una crema para las hemorroides si el mdico la autoriza. Descanse con las piernas levantadas (elevadas) si tiene calambres en las piernas o dolor en la parte baja de la espalda. Si desarrolla venas abultadas en las piernas:   Use medias de compresin segn las indicaciones de su mdico. Levante los pies durante 15 minutos, 3 o 4 veces por da. Limite la sal en sus alimentos. Seguridad Use el cinturn de seguridad en todo momento mientras  vaya en auto. Hable con el mdico si alguien le est haciendo dao o gritando mucho. Estilo de vida No se d baos de inmersin en agua caliente, baos turcos ni saunas. No se haga duchas vaginales. No use tampones ni toallas higinicas perfumadas. Evite el contacto con las bandejas sanitarias de los gatos y la tierra que estos animales usan. Estos contienen grmenes que pueden daar al beb y causar la prdida del beb ya sea aborto espontneo o muerte fetal. No consuma medicamentos a base de hierbas, drogas ilegales, ni medicamentos que el mdico no haya autorizado. No beba alcohol. No fume ni consuma ningn producto que contenga nicotina o tabaco. Si necesita ayuda para dejar de fumar, consulte al mdico. Instrucciones generales Cumpla con todas las visitas de seguimiento. Esto es importante. Consulte a su mdico acerca de dnde se dictan clases prenatales cerca de donde vive. Consulte a su mdico sobre los alimentos que debe comer o pdale que la ayude a encontrar a un asesor. Dnde buscar ms informacin American Pregnancy Association (Asociacin Americana del Embarazo): americanpregnancy.org American College of Obstetricians and Gynecologists (Colegio Estadounidense de Obstetras y Gineclogos): www.acog.org Office on Women's Health (Oficina para la Salud de la Mujer): womenshealth.gov/pregnancy Comunquese con un mdico si: Tiene un dolor de cabeza que no desaparece despus de tomar analgsicos. Nota cambios en la visin o ve manchas delante de los ojos. Tiene clicos o siente presin o dolor leves en la parte baja del vientre. Sigue sintiendo como si fuera a vomitar (nuseas), vomita o hace deposiciones acuosas (diarrea). Advierte lquido con mal olor que proviene de la vagina. Siente dolor al orinar o hace orina con mal olor. Tiene una gran hinchazn en la cara, las manos, las piernas, los tobillos o los pies. Tiene fiebre. Solicite ayuda de inmediato si: Tiene una prdida de  lquido por la vagina. Tiene sangrado o pequeas prdidas vaginales. Tiene clicos o dolor muy intensos en el vientre. Tiene dificultad para respirar. Sientes dolor en el pecho. Se desmaya. No ha sentido que el beb se moviera durante el perodo de tiempo que le dijo el mdico. Tiene dolor, hinchazn o enrojecimiento nuevos en un brazo o una pierna o se produce un aumento de alguno de estos sntomas. Resumen El segundo trimestre de embarazo va desde la semana 13 hasta la 27 (desde el mes 4 hasta el 6). Consuma comidas saludables. Haga ejercicios tal como le indic el mdico. La mayora de las personas pueden realizar su actividad fsica habitual durante el embarazo. No consuma medicamentos a base de hierbas, drogas ilegales, ni medicamentos que el mdico no haya autorizado. No beba alcohol. Llame al mdico si se enferma o si nota algo inusual acerca de su embarazo. Esta informacin no tiene como fin reemplazar el consejo del mdico. Asegrese de hacerle al mdico cualquier pregunta que tenga. Document Revised: 11/09/2019 Document Reviewed: 11/09/2019 Elsevier Patient Education  2023 Elsevier Inc.  

## 2021-10-08 NOTE — Progress Notes (Signed)
Subjective:  Alyssa Wallace is a 36 y.o. [phone] at 97w4dbeing seen today for ongoing prenatal care.  She is currently monitored for the following issues for this high-risk pregnancy and has History of VBAC; Previous preterm delivery x 2, antepartum; Language barrier; Low lying placenta nos or without hemorrhage, second trimester; Ganglion cyst of dorsum of right wrist; Supervision of high risk pregnancy, antepartum; Gestational diabetes; Has less than high school education; and AMA (advanced maternal age) multigravida 35+ on their problem list.  Patient reports general discomforts of pregnancy.  Contractions: Not present.  .  Movement: Present. Denies leaking of fluid.   The following portions of the patient's history were reviewed and updated as appropriate: allergies, current medications, past family history, past medical history, past social history, past surgical history and problem list. Problem list updated.  Objective:   Vitals:   10/08/21 1543  BP: 95/71  Pulse: 75  Weight: 128 lb 6.4 oz (58.2 kg)    Fetal Status: Fetal Heart Rate (bpm): 144   Movement: Present     General:  Alert, oriented and cooperative. Patient is in no acute distress.  Skin: Skin is warm and dry. No rash noted.   Cardiovascular: Normal heart rate noted  Respiratory: Normal respiratory effort, no problems with respiration noted  Abdomen: Soft, gravid, appropriate for gestational age. Pain/Pressure: Absent     Pelvic:  Cervical exam deferred        Extremities: Normal range of motion.  Edema: Trace  Mental Status: Normal mood and affect. Normal behavior. Normal judgment and thought content.   Urinalysis:      Assessment and Plan:  Pregnancy: [id number] 249w4d1. Supervision of high risk pregnancy, antepartum Stable  2. Low lying placenta nos or without hemorrhage, second trimester Stable Serial growth scans as per MFM  3. History of VBAC Desires repeat  Will need to sign consent at later  OB visit  4. Diet controlled gestational diabetes mellitus (GDM) in second trimester CBG's in goal range Continue with diet Serial growth scans as per MFM  5.57Multigravida of advanced maternal age in second trimester Stable  6. Language barrier Video interrupter used during today's visit  7. Previous preterm delivery x 2, antepartum Continue with vaginal prometrium  Preterm labor symptoms and general obstetric precautions including but not limited to vaginal bleeding, contractions, leaking of fluid and fetal movement were reviewed in detail with the patient. Please refer to After Visit Summary for other counseling recommendations.  Return in about 4 weeks (around 11/05/2021) for OB visit, face to face, MD only.   ErChancy MilroyMD

## 2021-10-28 ENCOUNTER — Ambulatory Visit: Payer: Self-pay | Admitting: *Deleted

## 2021-10-28 ENCOUNTER — Ambulatory Visit: Payer: Self-pay | Attending: Obstetrics

## 2021-10-28 ENCOUNTER — Other Ambulatory Visit: Payer: Self-pay | Admitting: *Deleted

## 2021-10-28 ENCOUNTER — Encounter: Payer: Self-pay | Admitting: *Deleted

## 2021-10-28 VITALS — BP 94/60 | HR 77

## 2021-10-28 DIAGNOSIS — O24410 Gestational diabetes mellitus in pregnancy, diet controlled: Secondary | ICD-10-CM

## 2021-10-28 DIAGNOSIS — O0990 Supervision of high risk pregnancy, unspecified, unspecified trimester: Secondary | ICD-10-CM | POA: Insufficient documentation

## 2021-10-28 DIAGNOSIS — Z555 Less than a high school diploma: Secondary | ICD-10-CM | POA: Insufficient documentation

## 2021-10-28 DIAGNOSIS — O4403 Placenta previa specified as without hemorrhage, third trimester: Secondary | ICD-10-CM

## 2021-10-28 DIAGNOSIS — O09292 Supervision of pregnancy with other poor reproductive or obstetric history, second trimester: Secondary | ICD-10-CM

## 2021-10-28 DIAGNOSIS — Z3A27 27 weeks gestation of pregnancy: Secondary | ICD-10-CM

## 2021-10-28 DIAGNOSIS — O09212 Supervision of pregnancy with history of pre-term labor, second trimester: Secondary | ICD-10-CM

## 2021-10-28 DIAGNOSIS — O09522 Supervision of elderly multigravida, second trimester: Secondary | ICD-10-CM

## 2021-10-28 DIAGNOSIS — O09523 Supervision of elderly multigravida, third trimester: Secondary | ICD-10-CM

## 2021-10-28 DIAGNOSIS — O4402 Placenta previa specified as without hemorrhage, second trimester: Secondary | ICD-10-CM | POA: Insufficient documentation

## 2021-10-29 ENCOUNTER — Ambulatory Visit (INDEPENDENT_AMBULATORY_CARE_PROVIDER_SITE_OTHER): Payer: Self-pay | Admitting: Family Medicine

## 2021-10-29 ENCOUNTER — Other Ambulatory Visit: Payer: Self-pay

## 2021-10-29 VITALS — BP 95/66 | HR 74 | Wt 129.3 lb

## 2021-10-29 DIAGNOSIS — O09522 Supervision of elderly multigravida, second trimester: Secondary | ICD-10-CM

## 2021-10-29 DIAGNOSIS — O24410 Gestational diabetes mellitus in pregnancy, diet controlled: Secondary | ICD-10-CM

## 2021-10-29 DIAGNOSIS — O0990 Supervision of high risk pregnancy, unspecified, unspecified trimester: Secondary | ICD-10-CM

## 2021-10-29 DIAGNOSIS — O4442 Low lying placenta NOS or without hemorrhage, second trimester: Secondary | ICD-10-CM

## 2021-10-29 DIAGNOSIS — Z23 Encounter for immunization: Secondary | ICD-10-CM

## 2021-10-29 DIAGNOSIS — Z789 Other specified health status: Secondary | ICD-10-CM

## 2021-10-29 DIAGNOSIS — Z98891 History of uterine scar from previous surgery: Secondary | ICD-10-CM

## 2021-10-29 DIAGNOSIS — O09219 Supervision of pregnancy with history of pre-term labor, unspecified trimester: Secondary | ICD-10-CM

## 2021-10-30 LAB — CBC
Hematocrit: 35.4 % (ref 34.0–46.6)
Hemoglobin: 12.1 g/dL (ref 11.1–15.9)
MCH: 33.2 pg — ABNORMAL HIGH (ref 26.6–33.0)
MCHC: 34.2 g/dL (ref 31.5–35.7)
MCV: 97 fL (ref 79–97)
Platelets: 202 10*3/uL (ref 150–450)
RBC: 3.64 x10E6/uL — ABNORMAL LOW (ref 3.77–5.28)
RDW: 13 % (ref 11.7–15.4)
WBC: 5.7 10*3/uL (ref 3.4–10.8)

## 2021-10-30 LAB — HEMOGLOBIN A1C
Est. average glucose Bld gHb Est-mCnc: 108 mg/dL
Hgb A1c MFr Bld: 5.4 % (ref 4.8–5.6)

## 2021-10-30 LAB — RPR: RPR Ser Ql: NONREACTIVE

## 2021-10-30 LAB — HIV ANTIBODY (ROUTINE TESTING W REFLEX): HIV Screen 4th Generation wRfx: NONREACTIVE

## 2021-11-11 ENCOUNTER — Other Ambulatory Visit: Payer: Self-pay

## 2021-11-11 ENCOUNTER — Ambulatory Visit (INDEPENDENT_AMBULATORY_CARE_PROVIDER_SITE_OTHER): Payer: Self-pay | Admitting: Obstetrics and Gynecology

## 2021-11-11 VITALS — BP 95/61 | HR 79 | Wt 131.1 lb

## 2021-11-11 DIAGNOSIS — O09523 Supervision of elderly multigravida, third trimester: Secondary | ICD-10-CM

## 2021-11-11 DIAGNOSIS — Z3A29 29 weeks gestation of pregnancy: Secondary | ICD-10-CM

## 2021-11-11 DIAGNOSIS — O09219 Supervision of pregnancy with history of pre-term labor, unspecified trimester: Secondary | ICD-10-CM

## 2021-11-11 DIAGNOSIS — O0990 Supervision of high risk pregnancy, unspecified, unspecified trimester: Secondary | ICD-10-CM

## 2021-11-11 DIAGNOSIS — Z98891 History of uterine scar from previous surgery: Secondary | ICD-10-CM

## 2021-11-11 DIAGNOSIS — Z789 Other specified health status: Secondary | ICD-10-CM

## 2021-11-11 DIAGNOSIS — O4442 Low lying placenta NOS or without hemorrhage, second trimester: Secondary | ICD-10-CM

## 2021-11-11 NOTE — Progress Notes (Signed)
    PRENATAL VISIT NOTE  Subjective:  Alyssa Wallace is a 36 y.o. [phone] at 15w3dbeing seen today for ongoing prenatal care.  She is currently monitored for the following issues for this high-risk pregnancy and has History of VBAC; Previous preterm delivery x 2, antepartum; Language barrier; Low lying placenta nos or without hemorrhage, second trimester; Ganglion cyst of dorsum of right wrist; Supervision of high risk pregnancy, antepartum; Gestational diabetes; Has less than high school education; and AMA (advanced maternal age) multigravida 35+ on their problem list.  Patient reports no complaints.  Contractions: Not present. Vag. Bleeding: None.  Movement: Present. Denies leaking of fluid.   The following portions of the patient's history were reviewed and updated as appropriate: allergies, current medications, past family history, past medical history, past social history, past surgical history and problem list.   Objective:   Vitals:   11/11/21 0820  BP: 95/61  Pulse: 79  Weight: 131 lb 1.6 oz (59.5 kg)    Fetal Status: Fetal Heart Rate (bpm): 144   Movement: Present     General:  Alert, oriented and cooperative. Patient is in no acute distress.  Skin: Skin is warm and dry. No rash noted.   Cardiovascular: Normal heart rate noted  Respiratory: Normal respiratory effort, no problems with respiration noted  Abdomen: Soft, gravid, appropriate for gestational age.  Pain/Pressure: Absent     Pelvic: Cervical exam deferred        Extremities: Normal range of motion.  Edema: None  Mental Status: Normal mood and affect. Normal behavior. Normal judgment and thought content.   Assessment and Plan:  Pregnancy: [id number] 293w3d. [redacted] weeks gestation of pregnancy 28wk labs normal last visit. Pt self pay. D/w her re: depo provera or BTL if she needs a c/s for whatever reason  2. Supervision of high risk pregnancy, antepartum  3. Previous preterm delivery x 2,  antepartum Continue prometrium  4. Low lying placenta nos or without hemorrhage, second trimester ?resolved at last u/s. F/u scan in two weeks. MAU precautions given and I told her to consider still with low placenta for now  6/21: 45%, 1102gm, ac 71%, afi wnl. ?resolved LL Placenta but fetal head was low.   5. Language barrier Interpreter used  6. History of VBAC [facility] consent already signed  7. Multigravida of advanced maternal age in third trimester  Preterm labor symptoms and general obstetric precautions including but not limited to vaginal bleeding, contractions, leaking of fluid and fetal movement were reviewed in detail with the patient. Please refer to After Visit Summary for other counseling recommendations.   Return in 2 weeks (on 11/25/2021) for high risk ob, in person, md visit.  Future Appointments  Date Time Provider [place]7/19/2023  8:30 AM [facility]/19/2023  8:45 AM [facility] US5 [facility]    ChAletha HalimMD

## 2021-11-22 NOTE — Progress Notes (Unsigned)
   PRENATAL VISIT NOTE  Subjective:  Alyssa Wallace is a 36 y.o. [phone] at 66w3dbeing seen today for ongoing prenatal care.  She is currently monitored for the following issues for this high-risk pregnancy and has History of VBAC; Previous preterm delivery x 2, antepartum; Language barrier; Low lying placenta nos or without hemorrhage, second trimester; Supervision of high risk pregnancy, antepartum; Gestational diabetes; Has less than high school education; and AMA (advanced maternal age) multigravida 35+ on their problem list.  Patient reports no complaints.  Contractions: Not present. Vag. Bleeding: None.  Movement: Present. Denies leaking of fluid.   The following portions of the patient's history were reviewed and updated as appropriate: allergies, current medications, past family history, past medical history, past social history, past surgical history and problem list.   Objective:   Vitals:   11/25/21 0959  BP: 102/67  Pulse: 74  Weight: 130 lb 8 oz (59.2 kg)    Fetal Status: Fetal Heart Rate (bpm): 141 Fundal Height: 31 cm Movement: Present     General:  Alert, oriented and cooperative. Patient is in no acute distress.  Skin: Skin is warm and dry. No rash noted.   Cardiovascular: Normal heart rate noted  Respiratory: Normal respiratory effort, no problems with respiration noted  Abdomen: Soft, gravid, appropriate for gestational age.  Pain/Pressure: Present     Pelvic: Cervical exam deferred        Extremities: Normal range of motion.  Edema: None  Mental Status: Normal mood and affect. Normal behavior. Normal judgment and thought content.   Assessment and Plan:  Pregnancy: [id number] 340w3d. Diet controlled gestational diabetes mellitus (GDM) in second trimester - Continue diet control, CBGS below. New log given.  - Growth normal on last scan on 6/21 - 45%ile. [place] was 60%ile, AC=HC, normal AFI, cephalic    2. Previous preterm delivery x 2,  antepartum Doing Prometrium qhs  3. History of VBAC Signed T[facility] consent  4. Language barrier Spanish interpreter used throughout the appointment   5. Low lying placenta nos or without hemorrhage, second trimester LLP 1.97 cm from os - okay for SVD  6. Supervision of high risk pregnancy, antepartum 28 week labs otherwise normal besides GDM Rh positive and s/p tdap  7. Multigravida of advanced maternal age in third trimester LR NIPS  8. [redacted] weeks gestation of pregnancy  Preterm labor symptoms and general obstetric precautions including but not limited to vaginal bleeding, contractions, leaking of fluid and fetal movement were reviewed in detail with the patient. Please refer to After Visit Summary for other counseling recommendations.   Return in about 2 weeks (around 12/09/2021) for OB VISIT, MD or APP.  Future Appointments  Date Time Provider [place]7/19/2023 10:35 AM DuRadene GunningMD [facility]8/16/2023  7:30 AM [facility]8/16/2023  7:45 AM [facility] US4 [facility] [place]New[place]  PaRadene GunningMD

## 2021-11-23 ENCOUNTER — Encounter (HOSPITAL_COMMUNITY): Payer: Self-pay | Admitting: Obstetrics & Gynecology

## 2021-11-23 ENCOUNTER — Inpatient Hospital Stay (HOSPITAL_COMMUNITY)
Admission: AD | Admit: 2021-11-23 | Discharge: 2021-11-23 | Disposition: A | Discharge status: Home / Self Care | Payer: Self-pay | Attending: Obstetrics & Gynecology | Admitting: Obstetrics & Gynecology

## 2021-11-23 DIAGNOSIS — R1030 Lower abdominal pain, unspecified: Secondary | ICD-10-CM | POA: Insufficient documentation

## 2021-11-23 DIAGNOSIS — O26893 Other specified pregnancy related conditions, third trimester: Secondary | ICD-10-CM | POA: Insufficient documentation

## 2021-11-23 DIAGNOSIS — N898 Other specified noninflammatory disorders of vagina: Secondary | ICD-10-CM | POA: Insufficient documentation

## 2021-11-23 DIAGNOSIS — Z3A31 31 weeks gestation of pregnancy: Secondary | ICD-10-CM | POA: Insufficient documentation

## 2021-11-23 DIAGNOSIS — O09523 Supervision of elderly multigravida, third trimester: Secondary | ICD-10-CM | POA: Insufficient documentation

## 2021-11-23 DIAGNOSIS — Z0371 Encounter for suspected problem with amniotic cavity and membrane ruled out: Secondary | ICD-10-CM | POA: Insufficient documentation

## 2021-11-23 HISTORY — DX: Gestational diabetes mellitus in pregnancy, unspecified control: O24.419

## 2021-11-23 LAB — WET PREP, GENITAL
Clue Cells Wet Prep HPF POC: NONE SEEN
Sperm: NONE SEEN
Trich, Wet Prep: NONE SEEN
WBC, Wet Prep HPF POC: <10 (ref <10)
Yeast Wet Prep HPF POC: NONE SEEN

## 2021-11-23 LAB — GC/CHLAMYDIA PROBE AMP (~~LOC~~) NOT AT ARMC
Chlamydia: NEGATIVE
Comment: NEGATIVE
Comment: NORMAL
Neisseria Gonorrhea: NEGATIVE

## 2021-11-23 LAB — URINALYSIS, ROUTINE W REFLEX MICROSCOPIC
Bilirubin Urine: NEGATIVE
Glucose, UA: NEGATIVE mg/dL
Hgb urine dipstick: NEGATIVE
Ketones, ur: NEGATIVE mg/dL
Leukocytes,Ua: NEGATIVE
Nitrite: NEGATIVE
Protein, ur: NEGATIVE mg/dL
Specific Gravity, Urine: 1.005 (ref 1.005–1.030)
pH: 6 (ref 5.0–8.0)

## 2021-11-23 LAB — AMNISURE RUPTURE OF MEMBRANE (ROM) NOT AT ARMC: Amnisure ROM: NEGATIVE

## 2021-11-23 NOTE — MAU Note (Signed)
Alyssa Wallace is a 36 y.o. at 52w1dhere in MAU reporting: 0100 woke up noticed underwear was wet.. clear fluid, denies odor. Denies VB. +FM. Reports lower back pain that comes and goes. "Just a little pain" states it is dull.   Onset of complaint: 0100 Pain score: 3 Vitals:   11/23/21 0429  BP: 98/64  Pulse: 68  Resp: 17  Temp: 98 F (36.7 C)  SpO2: 98%    FHT:145 Lab orders placed from triage: u/a

## 2021-11-23 NOTE — MAU Provider Note (Signed)
History     CSN: [id number]  Arrival date and time: 11/23/21 0402   Event Date/Time   First Provider Initiated Contact with Patient 11/23/21 [phone]      Chief Complaint  Patient presents with   Rupture of Membranes   Alyssa Wallace is a 36 y.o [id number] at 3w1dwho presents today with leaking fluid. She states that she had a gush of fluid around 0100 today, but she has not had any leaking since. She denies any vaginal bleeding, but she has had some occasional lower abdominal pain. She reports normal fetal movement. Patient has had one c-section and 2 vaginal deliveries.     OB History     Gravida  5   Para  3   Term  1   Preterm  2   AB  1   Living  3      SAB  1   IAB  0   Ectopic  0   Multiple  0   Live Births  3           Past Medical History:  Diagnosis Date   Gestational diabetes    Preterm labor     Past Surgical History:  Procedure Laterality Date   CERVICAL CERCLAGE N/A 07/30/2016   Procedure: CERCLAGE CERVICAL;  Surgeon: JWoodroe Mode MD;  [facility];  Service: Gynecology;  Laterality: N/A;   CESAREAN SECTION  04/05/2012   Procedure: CESAREAN SECTION;  Surgeon: JWoodroe Mode MD;  [facility];  Service: Obstetrics;  Laterality: N/A;  Primary Cesarean Section Delivery Girl @ [phone] Apgars 5/8   GANGLION CYST EXCISION Right 08/17/2017   Procedure: REMOVAL GANGLION OF RIGHT WRIST;  Surgeon: XLeandrew Koyanagi MD;  [facility]  Service: Orthopedics;  Laterality: Right;    Family History  Problem Relation Age of Onset   Diabetes Mother    Diabetes Father    Diabetes Sister     Social History   Tobacco Use   Smoking status: Never   Smokeless tobacco: Never  Vaping Use   Vaping Use: Never used  Substance Use Topics   Alcohol use: No   Drug use: No    Allergies: No Known Allergies  Medications Prior to Admission  Medication Sig Dispense Refill Last Dose   aspirin EC 81 MG tablet Take 1  tablet (81 mg total) by mouth daily. 90 tablet 3 11/22/2021   Prenatal Vit-Fe Fumarate-FA (PRENATAL PO) Take by mouth.   11/22/2021   progesterone (PROMETRIUM) 200 MG capsule Place 1 capsule (200 mg total) vaginally daily. 90 capsule 3 11/22/2021    Review of Systems  All other systems reviewed and are negative.  Physical Exam   Blood pressure 98/64, pulse 68, temperature 98 F (36.7 C), temperature source Oral, resp. rate 17, height '4\' 11"'$  (1.499 m), weight 59.8 kg, last menstrual period 04/19/2021, SpO2 98 %, unknown if currently breastfeeding.  Physical Exam Constitutional:      Appearance: She is well-developed.  HENT:     Head: Normocephalic.  Eyes:     Pupils: Pupils are equal, round, and reactive to light.  Cardiovascular:     Rate and Rhythm: Normal rate and regular rhythm.     Heart sounds: Normal heart sounds.  Pulmonary:     Effort: Pulmonary effort is normal. No respiratory distress.     Breath sounds: Normal breath sounds.  Abdominal:     Palpations: Abdomen is soft.  Tenderness: There is no abdominal tenderness.  Genitourinary:    Vagina: No bleeding. Vaginal discharge: mucusy.    Comments: External: no lesion Vagina: small amount of white discharge     Musculoskeletal:        General: Normal range of motion.     Cervical back: Normal range of motion and neck supple.  Skin:    General: Skin is warm and dry.  Neurological:     Mental Status: She is alert and oriented to person, place, and time.  Psychiatric:        Mood and Affect: Mood normal.        Behavior: Behavior normal.     Dilation: Fingertip Effacement (%): Thick Station: Ballotable Exam by:: Carlton Adam, CNM    Results for orders placed or performed during the hospital encounter of 11/23/21 (from the past 24 hour(s))  Urinalysis, Routine w reflex microscopic Urine, Clean Catch     Status: Abnormal   Collection Time: 11/23/21  4:59 AM  Result Value Ref Range   Color, Urine STRAW (A)  YELLOW   APPearance CLEAR CLEAR   Specific Gravity, Urine 1.005 1.005 - 1.030   pH 6.0 5.0 - 8.0   Glucose, UA NEGATIVE NEGATIVE mg/dL   Hgb urine dipstick NEGATIVE NEGATIVE   Bilirubin Urine NEGATIVE NEGATIVE   Ketones, ur NEGATIVE NEGATIVE mg/dL   Protein, ur NEGATIVE NEGATIVE mg/dL   Nitrite NEGATIVE NEGATIVE   Leukocytes,Ua NEGATIVE NEGATIVE  Amnisure rupture of membrane (rom)not at [facility]     Status: None   Collection Time: 11/23/21  6:34 AM  Result Value Ref Range   Amnisure ROM NEGATIVE   Wet prep, genital     Status: None   Collection Time: 11/23/21  6:34 AM   Specimen: Urine, Clean Catch  Result Value Ref Range   Yeast Wet Prep HPF POC NONE SEEN NONE SEEN   Trich, Wet Prep NONE SEEN NONE SEEN   Clue Cells Wet Prep HPF POC NONE SEEN NONE SEEN   WBC, Wet Prep HPF POC <10 <10   Sperm NONE SEEN     NST:  Baseline: 130 Variability: moderate Accels: 15x15 Decels: none Toco: none Reactive/Appropriate for GA  MAU Course  Procedures  MDM   Assessment and Plan   1. Encounter for suspected premature rupture of amniotic membranes, with rupture of membranes not found   2. Vaginal discharge during pregnancy in third trimester   3. [redacted] weeks gestation of pregnancy    DC home in stable condition  Comfort measures reviewed  3rd Trimester precautions  PTL precautions  Fetal kick counts RX: none  Return to MAU as needed FU with OB as planned   [place] [facility] Follow up.   Specialty: Obstetrics and Gynecology Contact information: [place] [id number] [employer] DNP, CNM  11/23/21  7:24 AM

## 2021-11-25 ENCOUNTER — Encounter: Payer: Self-pay | Admitting: Obstetrics and Gynecology

## 2021-11-25 ENCOUNTER — Encounter: Payer: Self-pay | Admitting: *Deleted

## 2021-11-25 ENCOUNTER — Other Ambulatory Visit: Payer: Self-pay | Admitting: *Deleted

## 2021-11-25 ENCOUNTER — Ambulatory Visit: Payer: Self-pay | Admitting: *Deleted

## 2021-11-25 ENCOUNTER — Ambulatory Visit (INDEPENDENT_AMBULATORY_CARE_PROVIDER_SITE_OTHER): Payer: Self-pay | Admitting: Obstetrics and Gynecology

## 2021-11-25 ENCOUNTER — Ambulatory Visit: Payer: Self-pay | Attending: Obstetrics

## 2021-11-25 VITALS — BP 94/60 | HR 71

## 2021-11-25 VITALS — BP 102/67 | HR 74 | Wt 130.5 lb

## 2021-11-25 DIAGNOSIS — O09523 Supervision of elderly multigravida, third trimester: Secondary | ICD-10-CM

## 2021-11-25 DIAGNOSIS — O09299 Supervision of pregnancy with other poor reproductive or obstetric history, unspecified trimester: Secondary | ICD-10-CM

## 2021-11-25 DIAGNOSIS — O4403 Placenta previa specified as without hemorrhage, third trimester: Secondary | ICD-10-CM

## 2021-11-25 DIAGNOSIS — O0990 Supervision of high risk pregnancy, unspecified, unspecified trimester: Secondary | ICD-10-CM | POA: Insufficient documentation

## 2021-11-25 DIAGNOSIS — O09899 Supervision of other high risk pregnancies, unspecified trimester: Secondary | ICD-10-CM

## 2021-11-25 DIAGNOSIS — O4442 Low lying placenta NOS or without hemorrhage, second trimester: Secondary | ICD-10-CM

## 2021-11-25 DIAGNOSIS — Z555 Less than a high school diploma: Secondary | ICD-10-CM

## 2021-11-25 DIAGNOSIS — Z789 Other specified health status: Secondary | ICD-10-CM

## 2021-11-25 DIAGNOSIS — O09219 Supervision of pregnancy with history of pre-term labor, unspecified trimester: Secondary | ICD-10-CM

## 2021-11-25 DIAGNOSIS — Z3A31 31 weeks gestation of pregnancy: Secondary | ICD-10-CM

## 2021-11-25 DIAGNOSIS — O24410 Gestational diabetes mellitus in pregnancy, diet controlled: Secondary | ICD-10-CM

## 2021-11-25 DIAGNOSIS — O09213 Supervision of pregnancy with history of pre-term labor, third trimester: Secondary | ICD-10-CM

## 2021-11-25 DIAGNOSIS — Z98891 History of uterine scar from previous surgery: Secondary | ICD-10-CM

## 2021-11-25 DIAGNOSIS — O09293 Supervision of pregnancy with other poor reproductive or obstetric history, third trimester: Secondary | ICD-10-CM

## 2021-12-14 ENCOUNTER — Ambulatory Visit (INDEPENDENT_AMBULATORY_CARE_PROVIDER_SITE_OTHER): Payer: Self-pay | Admitting: Obstetrics and Gynecology

## 2021-12-14 ENCOUNTER — Other Ambulatory Visit: Payer: Self-pay

## 2021-12-14 VITALS — BP 103/64 | HR 87 | Wt 132.0 lb

## 2021-12-14 DIAGNOSIS — N898 Other specified noninflammatory disorders of vagina: Secondary | ICD-10-CM

## 2021-12-14 DIAGNOSIS — Z789 Other specified health status: Secondary | ICD-10-CM

## 2021-12-14 DIAGNOSIS — O26893 Other specified pregnancy related conditions, third trimester: Secondary | ICD-10-CM

## 2021-12-14 DIAGNOSIS — Z3A34 34 weeks gestation of pregnancy: Secondary | ICD-10-CM

## 2021-12-14 DIAGNOSIS — O24410 Gestational diabetes mellitus in pregnancy, diet controlled: Secondary | ICD-10-CM

## 2021-12-14 DIAGNOSIS — O4442 Low lying placenta NOS or without hemorrhage, second trimester: Secondary | ICD-10-CM

## 2021-12-14 DIAGNOSIS — Z98891 History of uterine scar from previous surgery: Secondary | ICD-10-CM

## 2021-12-14 DIAGNOSIS — O09523 Supervision of elderly multigravida, third trimester: Secondary | ICD-10-CM

## 2021-12-14 DIAGNOSIS — O0990 Supervision of high risk pregnancy, unspecified, unspecified trimester: Secondary | ICD-10-CM

## 2021-12-14 DIAGNOSIS — Z603 Acculturation difficulty: Secondary | ICD-10-CM

## 2021-12-14 DIAGNOSIS — O09219 Supervision of pregnancy with history of pre-term labor, unspecified trimester: Secondary | ICD-10-CM

## 2021-12-14 NOTE — Progress Notes (Signed)
Patient complains of lower back pain and pelvic. She also informed me that she occasionally has "drops of vaginal fluid come out" Patient stated that she is not sure whether its normal vaginal discharge or her water.

## 2021-12-14 NOTE — Progress Notes (Signed)
   PRENATAL VISIT NOTE  Subjective:  Aarianna Hoadley is a 36 y.o. [phone] at 21w1dbeing seen today for ongoing prenatal care.  She is currently monitored for the following issues for this high-risk pregnancy and has History of VBAC; Previous preterm delivery x 2, antepartum; Language barrier; Low lying placenta nos or without hemorrhage, second trimester; Supervision of high risk pregnancy, antepartum; Gestational diabetes; Has less than high school education; and AMA (advanced maternal age) multigravida 35+ on their problem list.  Patient reports  vaginal discharge, no bleeding .  Contractions: Not present.  .  Movement: Present. Denies leaking of fluid.   The following portions of the patient's history were reviewed and updated as appropriate: allergies, current medications, past family history, past medical history, past social history, past surgical history and problem list.   Objective:   Vitals:   12/14/21 1034  BP: 103/64  Pulse: 87  Weight: 132 lb (59.9 kg)    Fetal Status: Fetal Heart Rate (bpm): 130s   Movement: Present     General:  Alert, oriented and cooperative. Patient is in no acute distress.  Skin: Skin is warm and dry. No rash noted.   Cardiovascular: Normal heart rate noted  Respiratory: Normal respiratory effort, no problems with respiration noted  Abdomen: Soft, gravid, appropriate for gestational age.  Pain/Pressure: Absent     Pelvic: Cervical exam performed in the presence of a chaperone Dilation: Closed Effacement (%): 0 Station: 0 on sterile spec exam; normal white d/c of pregnancy  Extremities: Normal range of motion.     Mental Status: Normal mood and affect. Normal behavior. Normal judgment and thought content.   Assessment and Plan:  Pregnancy: [id number] 357w1d. [redacted] weeks gestation of pregnancy GBS nv  2. Multigravida of advanced maternal age in third trimester  3. Low lying placenta nos or without hemorrhage, second trimester 1.97cm on  transvag u/s on 7/19 and okay for vag delivery per mfm  4. Supervision of high risk pregnancy, antepartum Normal spec exam today with normal d/c of pregnancy  5. Diet controlled gestational diabetes mellitus (GDM) in third trimester Normal cbg log Follow up growth u/s next week  6. History of VBAC Normal cbg log  7. Previous preterm delivery x 2, antepartum Continue vag prometrium  8. Language barrier Interpreter used  9. Vaginal discharge during pregnancy in third trimester   Preterm labor symptoms and general obstetric precautions including but not limited to vaginal bleeding, contractions, leaking of fluid and fetal movement were reviewed in detail with the patient. Please refer to After Visit Summary for other counseling recommendations.   No follow-ups on file.  Future Appointments  Date Time Provider [place]8/16/2023  7:30 AM [facility] NURSE [facility]/16/2023  7:45 AM [facility] US4 [facility]8/21/2023  2:15 PM BaGriffin BasilMD WMSun [facility]CM[facility]8/28/2023  3:55 PM CrConcepcion LivingMD [facility]9/09/2021 10:15 AM WeDanielle RankinMNeshoba County [facility] HospitalM[facility]9/04/2022 10:55 AM EcClarnce FlockMD WM[facility]CM[facility]9/20/2023  1:15 PM EcClarnce FlockMD WMLincoln HospitalMM[facility]9/20/2023  2:15 PM WMC-WOCA NST [facility]Reg[facility]  ChAletha HalimMD

## 2021-12-23 ENCOUNTER — Ambulatory Visit: Payer: Self-pay | Admitting: *Deleted

## 2021-12-23 ENCOUNTER — Ambulatory Visit: Payer: Self-pay | Attending: Obstetrics and Gynecology

## 2021-12-23 VITALS — BP 94/59 | HR 66

## 2021-12-23 DIAGNOSIS — Z3A35 35 weeks gestation of pregnancy: Secondary | ICD-10-CM

## 2021-12-23 DIAGNOSIS — Z9889 Other specified postprocedural states: Secondary | ICD-10-CM | POA: Insufficient documentation

## 2021-12-23 DIAGNOSIS — O09293 Supervision of pregnancy with other poor reproductive or obstetric history, third trimester: Secondary | ICD-10-CM

## 2021-12-23 DIAGNOSIS — O09899 Supervision of other high risk pregnancies, unspecified trimester: Secondary | ICD-10-CM | POA: Insufficient documentation

## 2021-12-23 DIAGNOSIS — Z555 Less than a high school diploma: Secondary | ICD-10-CM

## 2021-12-23 DIAGNOSIS — O0990 Supervision of high risk pregnancy, unspecified, unspecified trimester: Secondary | ICD-10-CM | POA: Insufficient documentation

## 2021-12-23 DIAGNOSIS — O09523 Supervision of elderly multigravida, third trimester: Secondary | ICD-10-CM | POA: Insufficient documentation

## 2021-12-23 DIAGNOSIS — O09213 Supervision of pregnancy with history of pre-term labor, third trimester: Secondary | ICD-10-CM

## 2021-12-23 DIAGNOSIS — O24410 Gestational diabetes mellitus in pregnancy, diet controlled: Secondary | ICD-10-CM

## 2021-12-23 DIAGNOSIS — O09299 Supervision of pregnancy with other poor reproductive or obstetric history, unspecified trimester: Secondary | ICD-10-CM | POA: Insufficient documentation

## 2021-12-28 ENCOUNTER — Other Ambulatory Visit (HOSPITAL_COMMUNITY)
Admission: RE | Admit: 2021-12-28 | Discharge: 2021-12-28 | Disposition: A | Discharge status: Home / Self Care | Payer: Self-pay | Source: Ambulatory Visit | Attending: Obstetrics and Gynecology | Admitting: Obstetrics and Gynecology

## 2021-12-28 ENCOUNTER — Other Ambulatory Visit: Payer: Self-pay

## 2021-12-28 ENCOUNTER — Ambulatory Visit (INDEPENDENT_AMBULATORY_CARE_PROVIDER_SITE_OTHER): Payer: Self-pay | Admitting: Obstetrics and Gynecology

## 2021-12-28 VITALS — BP 103/71 | HR 72 | Wt 134.8 lb

## 2021-12-28 DIAGNOSIS — O09523 Supervision of elderly multigravida, third trimester: Secondary | ICD-10-CM | POA: Insufficient documentation

## 2021-12-28 DIAGNOSIS — O24410 Gestational diabetes mellitus in pregnancy, diet controlled: Secondary | ICD-10-CM

## 2021-12-28 DIAGNOSIS — Z789 Other specified health status: Secondary | ICD-10-CM

## 2021-12-28 DIAGNOSIS — Z3A36 36 weeks gestation of pregnancy: Secondary | ICD-10-CM

## 2021-12-28 DIAGNOSIS — O4442 Low lying placenta NOS or without hemorrhage, second trimester: Secondary | ICD-10-CM

## 2021-12-28 DIAGNOSIS — Z98891 History of uterine scar from previous surgery: Secondary | ICD-10-CM

## 2021-12-28 DIAGNOSIS — O0993 Supervision of high risk pregnancy, unspecified, third trimester: Secondary | ICD-10-CM | POA: Insufficient documentation

## 2021-12-28 DIAGNOSIS — O0990 Supervision of high risk pregnancy, unspecified, unspecified trimester: Secondary | ICD-10-CM

## 2021-12-28 DIAGNOSIS — O09219 Supervision of pregnancy with history of pre-term labor, unspecified trimester: Secondary | ICD-10-CM

## 2021-12-28 NOTE — Addendum Note (Signed)
Addended by: Bethanne Ginger on: 12/28/2021 04:58 PM   Modules accepted: Orders

## 2021-12-28 NOTE — Progress Notes (Signed)
   PRENATAL VISIT NOTE  Subjective:  Alyssa Wallace is a 36 y.o. [phone] at 33w1dbeing seen today for ongoing prenatal care.  She is currently monitored for the following issues for this high-risk pregnancy and has History of VBAC; Previous preterm delivery x 2, antepartum; Language barrier; Low lying placenta nos or without hemorrhage, second trimester; Supervision of high risk pregnancy, antepartum; Gestational diabetes; Has less than high school education; and AMA (advanced maternal age) multigravida 35+ on their problem list.  Patient doing well with no acute concerns today. She reports no complaints.  Contractions: Not present. Vag. Bleeding: None.  Movement: Present. Denies leaking of fluid.   The following portions of the patient's history were reviewed and updated as appropriate: allergies, current medications, past family history, past medical history, past social history, past surgical history and problem list. Problem list updated.  Objective:   Vitals:   12/28/21 1415  BP: 103/71  Pulse: 72  Weight: 134 lb 12.8 oz (61.1 kg)    Fetal Status: Fetal Heart Rate (bpm): 145 Fundal Height: 36 cm Movement: Present     General:  Alert, oriented and cooperative. Patient is in no acute distress.  Skin: Skin is warm and dry. No rash noted.   Cardiovascular: Normal heart rate noted  Respiratory: Normal respiratory effort, no problems with respiration noted  Abdomen: Soft, gravid, appropriate for gestational age.  Pain/Pressure: Absent     Pelvic: Cervical exam performed Dilation: 1.5 Effacement (%): 70 Station: -2  only checked external os due to placenta still relatively close  Extremities: Normal range of motion.  Edema: Trace  Mental Status:  Normal mood and affect. Normal behavior. Normal judgment and thought content.   Assessment and Plan:  Pregnancy: [id number] 366w1d1. [redacted] weeks gestation of pregnancy   2. Diet controlled gestational diabetes mellitus (GDM) in  third trimester FBS: 68-90 PPBS: 90-105  3. History of VBAC Pt desires TOLAC, consent previously signed  4. Previous preterm delivery x 2, antepartum No s/sx of preterm labor, discontinue prometrium this week  5. Language barrier Live interpreter present  6. Supervision of high risk pregnancy, antepartum Continue routine prenatal care  7. Multigravida of advanced maternal age in third trimester   8. Low lying placenta nos or without hemorrhage, second trimester Per MFM placenta was 1.97 cm from cervical os on 11/25/21  Preterm labor symptoms and general obstetric precautions including but not limited to vaginal bleeding, contractions, leaking of fluid and fetal movement were reviewed in detail with the patient.  Please refer to After Visit Summary for other counseling recommendations.   Return in about 1 week (around 01/04/2022) [facility].   LaLynnda ShieldsMD Faculty Attending Center for [facility]

## 2021-12-29 LAB — GC/CHLAMYDIA PROBE AMP (~~LOC~~) NOT AT ARMC
Chlamydia: NEGATIVE
Comment: NEGATIVE
Comment: NORMAL
Neisseria Gonorrhea: NEGATIVE

## 2022-01-01 LAB — CULTURE, BETA STREP (GROUP B ONLY): Strep Gp B Culture: NEGATIVE

## 2022-01-04 ENCOUNTER — Ambulatory Visit (INDEPENDENT_AMBULATORY_CARE_PROVIDER_SITE_OTHER): Payer: Self-pay | Admitting: Family Medicine

## 2022-01-04 ENCOUNTER — Other Ambulatory Visit: Payer: Self-pay

## 2022-01-04 VITALS — BP 98/61 | HR 69 | Wt 138.5 lb

## 2022-01-04 DIAGNOSIS — O4442 Low lying placenta NOS or without hemorrhage, second trimester: Secondary | ICD-10-CM

## 2022-01-04 DIAGNOSIS — Z98891 History of uterine scar from previous surgery: Secondary | ICD-10-CM

## 2022-01-04 DIAGNOSIS — O0990 Supervision of high risk pregnancy, unspecified, unspecified trimester: Secondary | ICD-10-CM

## 2022-01-04 DIAGNOSIS — Z789 Other specified health status: Secondary | ICD-10-CM

## 2022-01-04 DIAGNOSIS — O09523 Supervision of elderly multigravida, third trimester: Secondary | ICD-10-CM

## 2022-01-04 DIAGNOSIS — O09219 Supervision of pregnancy with history of pre-term labor, unspecified trimester: Secondary | ICD-10-CM

## 2022-01-04 DIAGNOSIS — O24410 Gestational diabetes mellitus in pregnancy, diet controlled: Secondary | ICD-10-CM

## 2022-01-04 NOTE — Progress Notes (Unsigned)
   PRENATAL VISIT NOTE  Subjective:  Alyssa Wallace is a 36 y.o. [phone] at 3w1dbeing seen today for ongoing prenatal care.  She is currently monitored for the following issues for this {Blank single:19197::"high-risk","low-risk"} pregnancy and has History of VBAC; Previous preterm delivery x 2, antepartum; Language barrier; Low lying placenta nos or without hemorrhage, second trimester; Supervision of high risk pregnancy, antepartum; Gestational diabetes; Has less than high school education; and AMA (advanced maternal age) multigravida 35+ on their problem list.  Patient reports {sx:14538}.  Contractions: Irritability. Vag. Bleeding: None.  Movement: Present. Denies leaking of fluid.   The following portions of the patient's history were reviewed and updated as appropriate: allergies, current medications, past family history, past medical history, past social history, past surgical history and problem list.   Objective:   Vitals:   01/04/22 1553  BP: 98/61  Pulse: 69  Weight: 138 lb 8 oz (62.8 kg)    Fetal Status: Fetal Heart Rate (bpm): 142   Movement: Present     General:  Alert, oriented and cooperative. Patient is in no acute distress.  Skin: Skin is warm and dry. No rash noted.   Cardiovascular: Normal heart rate noted  Respiratory: Normal respiratory effort, no problems with respiration noted  Abdomen: Soft, gravid, appropriate for gestational age.  Pain/Pressure: Present     Pelvic: {Blank single:19197::"Cervical exam performed in the presence of a chaperone","Cervical exam deferred"}        Extremities: Normal range of motion.     Mental Status: Normal mood and affect. Normal behavior. Normal judgment and thought content.   Assessment and Plan:  Pregnancy: [id number] 329w1d. Supervision of high risk pregnancy, antepartum ***  2. Diet controlled gestational diabetes mellitus (GDM) in third trimester ***  3. History of VBAC ***  4. Language  barrier ***  5. Previous preterm delivery x 2, antepartum ***  6. Multigravida of advanced maternal age in third trimester ***  7. Low lying placenta nos or without hemorrhage, second trimester ***  {Blank single:19197::"Term","Preterm"} labor symptoms and general obstetric precautions including but not limited to vaginal bleeding, contractions, leaking of fluid and fetal movement were reviewed in detail with the patient. Please refer to After Visit Summary for other counseling recommendations.   No follow-ups on file.  Future Appointments  Date Time Provider [place]/09/2021 10:15 AM WeDanielle RankinMMobile [facility] CenterM[facility]9/04/2022 10:55 AM EcClarnce FlockMD WMOsborne [facility] HospitalMC[facility]9/20/2023  1:15 PM EcClarnce FlockMD WMFort [facility]CM[facility]9/20/2023  2:15 PM WMC-WOCA NST WMGramercy Surgery Center LtdM[facility]  JoConcepcion LivingMD

## 2022-01-10 ENCOUNTER — Inpatient Hospital Stay (HOSPITAL_COMMUNITY)
Admission: AD | Admit: 2022-01-10 | Discharge: 2022-01-12 | DRG: 806 | Disposition: A | Discharge status: Home / Self Care | Payer: Medicaid Other | Attending: Obstetrics & Gynecology | Admitting: Obstetrics & Gynecology

## 2022-01-10 ENCOUNTER — Encounter (HOSPITAL_COMMUNITY): Payer: Self-pay | Admitting: Obstetrics & Gynecology

## 2022-01-10 DIAGNOSIS — O4290 Premature rupture of membranes, unspecified as to length of time between rupture and onset of labor, unspecified weeks of gestation: Secondary | ICD-10-CM | POA: Diagnosis present

## 2022-01-10 DIAGNOSIS — O34211 Maternal care for low transverse scar from previous cesarean delivery: Secondary | ICD-10-CM | POA: Diagnosis not present

## 2022-01-10 DIAGNOSIS — O4443 Low lying placenta NOS or without hemorrhage, third trimester: Secondary | ICD-10-CM | POA: Diagnosis present

## 2022-01-10 DIAGNOSIS — Z3A38 38 weeks gestation of pregnancy: Secondary | ICD-10-CM | POA: Diagnosis not present

## 2022-01-10 DIAGNOSIS — O34219 Maternal care for unspecified type scar from previous cesarean delivery: Secondary | ICD-10-CM | POA: Diagnosis present

## 2022-01-10 DIAGNOSIS — O09523 Supervision of elderly multigravida, third trimester: Secondary | ICD-10-CM | POA: Diagnosis not present

## 2022-01-10 DIAGNOSIS — O24420 Gestational diabetes mellitus in childbirth, diet controlled: Secondary | ICD-10-CM | POA: Diagnosis present

## 2022-01-10 DIAGNOSIS — O4292 Full-term premature rupture of membranes, unspecified as to length of time between rupture and onset of labor: Principal | ICD-10-CM | POA: Diagnosis present

## 2022-01-10 DIAGNOSIS — O4202 Full-term premature rupture of membranes, onset of labor within 24 hours of rupture: Principal | ICD-10-CM

## 2022-01-10 MED ORDER — ACETAMINOPHEN 325 MG PO TABS
650.0000 mg | ORAL_TABLET | ORAL | Status: DC | PRN
Start: 2022-01-10 — End: 2022-01-11

## 2022-01-10 MED ORDER — SOD CITRATE-CITRIC ACID 500-334 MG/5ML PO SOLN: 30.0000 mL | ORAL | Status: DC | PRN

## 2022-01-10 MED ORDER — LIDOCAINE HCL (PF) 1 % IJ SOLN
30.0000 mL | INTRAMUSCULAR | Status: AC | PRN
  Administered 2022-01-11: 30 mL via SUBCUTANEOUS
  Filled 2022-01-10: qty 30

## 2022-01-10 MED ORDER — OXYTOCIN BOLUS FROM INFUSION
333.0000 mL | Freq: Once | INTRAVENOUS | Status: AC
  Administered 2022-01-11: 333 mL via INTRAVENOUS

## 2022-01-10 MED ORDER — OXYCODONE-ACETAMINOPHEN 5-325 MG PO TABS: 2.0000 | ORAL_TABLET | ORAL | Status: DC | PRN

## 2022-01-10 MED ORDER — TERBUTALINE SULFATE 1 MG/ML IJ SOLN: 0.2500 mg | Freq: Once | INTRAMUSCULAR | Status: DC | PRN

## 2022-01-10 MED ORDER — ONDANSETRON HCL 4 MG/2ML IJ SOLN: 4.0000 mg | Freq: Four times a day (QID) | INTRAMUSCULAR | Status: DC | PRN

## 2022-01-10 MED ORDER — OXYTOCIN-SODIUM CHLORIDE 30-0.9 UT/500ML-% IV SOLN
2.5000 [IU]/h | INTRAVENOUS | Status: DC
  Administered 2022-01-11: 2.5 [IU]/h via INTRAVENOUS
  Filled 2022-01-10: qty 500

## 2022-01-10 MED ORDER — LACTATED RINGERS IV SOLN: 500.0000 mL | INTRAVENOUS | Status: DC | PRN

## 2022-01-10 MED ORDER — LACTATED RINGERS IV SOLN: INTRAVENOUS | Status: DC

## 2022-01-10 MED ORDER — OXYCODONE-ACETAMINOPHEN 5-325 MG PO TABS: 1.0000 | ORAL_TABLET | ORAL | Status: DC | PRN

## 2022-01-10 NOTE — MAU Note (Signed)
Dr. Trina Ao bedside US, verified vertex.

## 2022-01-10 NOTE — MAU Note (Signed)
.  Alyssa Wallace is a 36 y.o. at 50w0dhere in MAU reporting: water broke at 2215, No bleeding. Pt states no pain. +FM. Last cervical exam was 2 weeks ago 1.5 cm.   Onset of complaint: 2215  Pain score: 0/10    111/76 BP, 02 96, pulse 70. Resp. 20 FHT: 135 Lab orders placed from triage:   Labor eval, Fern test.

## 2022-01-10 NOTE — MAU Note (Signed)
Duplicate triage note, incorrect entry.

## 2022-01-10 NOTE — Progress Notes (Signed)
Bedside US done, confirmed vertex presentation. Patient with gross ROM, being admitted to L&D.  Liliane Channel MD MPH OB Fellow, [place] for [place] 01/10/2022

## 2022-01-11 ENCOUNTER — Other Ambulatory Visit: Payer: Self-pay

## 2022-01-11 ENCOUNTER — Encounter (HOSPITAL_COMMUNITY): Payer: Self-pay | Admitting: Obstetrics & Gynecology

## 2022-01-11 DIAGNOSIS — O24420 Gestational diabetes mellitus in childbirth, diet controlled: Secondary | ICD-10-CM

## 2022-01-11 DIAGNOSIS — O4443 Low lying placenta NOS or without hemorrhage, third trimester: Secondary | ICD-10-CM

## 2022-01-11 DIAGNOSIS — O34211 Maternal care for low transverse scar from previous cesarean delivery: Secondary | ICD-10-CM

## 2022-01-11 DIAGNOSIS — Z3A38 38 weeks gestation of pregnancy: Secondary | ICD-10-CM

## 2022-01-11 DIAGNOSIS — O09523 Supervision of elderly multigravida, third trimester: Secondary | ICD-10-CM

## 2022-01-11 LAB — TYPE AND SCREEN
ABO/RH(D): O POS
Antibody Screen: NEGATIVE

## 2022-01-11 LAB — CBC
HCT: 40.5 % (ref 36.0–46.0)
Hemoglobin: 13.7 g/dL (ref 12.0–15.0)
MCH: 33.9 pg (ref 26.0–34.0)
MCHC: 33.8 g/dL (ref 30.0–36.0)
MCV: 100.2 fL — ABNORMAL HIGH (ref 80.0–100.0)
Platelets: 176 10*3/uL (ref 150–400)
RBC: 4.04 MIL/uL (ref 3.87–5.11)
RDW: 14.5 % (ref 11.5–15.5)
WBC: 5.8 10*3/uL (ref 4.0–10.5)
nRBC: 0 % (ref 0.0–0.2)

## 2022-01-11 LAB — RPR: RPR Ser Ql: NONREACTIVE

## 2022-01-11 LAB — POCT FERN TEST: POCT Fern Test: POSITIVE

## 2022-01-11 MED ORDER — ONDANSETRON HCL 4 MG PO TABS: 4.0000 mg | ORAL_TABLET | ORAL | Status: DC | PRN

## 2022-01-11 MED ORDER — BENZOCAINE-MENTHOL 20-0.5 % EX AERO: 1.0000 | INHALATION_SPRAY | CUTANEOUS | Status: DC | PRN

## 2022-01-11 MED ORDER — DIPHENHYDRAMINE HCL 25 MG PO CAPS: 25.0000 mg | ORAL_CAPSULE | Freq: Four times a day (QID) | ORAL | Status: DC | PRN

## 2022-01-11 MED ORDER — ACETAMINOPHEN 325 MG PO TABS: 650.0000 mg | ORAL_TABLET | ORAL | Status: DC | PRN

## 2022-01-11 MED ORDER — DIBUCAINE (PERIANAL) 1 % EX OINT: 1.0000 | TOPICAL_OINTMENT | CUTANEOUS | Status: DC | PRN

## 2022-01-11 MED ORDER — PRENATAL MULTIVITAMIN CH
1.0000 | ORAL_TABLET | Freq: Every day | ORAL | Status: DC
  Administered 2022-01-11: 1 via ORAL
  Filled 2022-01-11 (×2): qty 1

## 2022-01-11 MED ORDER — ONDANSETRON HCL 4 MG/2ML IJ SOLN: 4.0000 mg | INTRAMUSCULAR | Status: DC | PRN

## 2022-01-11 MED ORDER — COCONUT OIL OIL: 1.0000 | TOPICAL_OIL | Status: DC | PRN

## 2022-01-11 MED ORDER — IBUPROFEN 600 MG PO TABS
600.0000 mg | ORAL_TABLET | Freq: Four times a day (QID) | ORAL | Status: DC
  Administered 2022-01-11 (×2): 600 mg via ORAL
  Filled 2022-01-11 (×4): qty 1

## 2022-01-11 MED ORDER — SENNOSIDES-DOCUSATE SODIUM 8.6-50 MG PO TABS
2.0000 | ORAL_TABLET | ORAL | Status: DC
  Administered 2022-01-11: 2 via ORAL
  Filled 2022-01-11 (×2): qty 2

## 2022-01-11 MED ORDER — ZOLPIDEM TARTRATE 5 MG PO TABS: 5.0000 mg | ORAL_TABLET | Freq: Every evening | ORAL | Status: DC | PRN

## 2022-01-11 MED ORDER — TETANUS-DIPHTH-ACELL PERTUSSIS 5-2.5-18.5 LF-MCG/0.5 IM SUSY: 0.5000 mL | PREFILLED_SYRINGE | Freq: Once | INTRAMUSCULAR | Status: DC

## 2022-01-11 MED ORDER — SIMETHICONE 80 MG PO CHEW: 80.0000 mg | CHEWABLE_TABLET | ORAL | Status: DC | PRN

## 2022-01-11 MED ORDER — WITCH HAZEL-GLYCERIN EX PADS: 1.0000 | MEDICATED_PAD | CUTANEOUS | Status: DC | PRN

## 2022-01-11 NOTE — Discharge Summary (Addendum)
Postpartum Discharge Summary  Date of Service updated 01/12/2022      Patient Name: Alyssa Wallace DOB: Jun 07, 1985 MRN: [id number]  Date of admission: 01/10/2022 Delivery date:01/11/2022  Delivering provider: Janice Norrie  Date of discharge: 01/12/2022  Admitting diagnosis: Premature rupture of membranes [O42.90] Intrauterine pregnancy: [redacted]w[redacted]d     Secondary diagnosis:  Principal Problem:   Premature rupture of membranes Active Problems:   Vaginal delivery  Additional problems: Low Lying placenta, AMA, prior preterm delivery    Discharge diagnosis: Term Pregnancy Delivered                                              Post partum procedures: n/a Augmentation:  none Complications: None  Hospital course: Onset of Labor With Vaginal Delivery      36 y.o. yo [id number] at [redacted]w[redacted]d was admitted in Active Labor on 01/10/2022. Patient had an uncomplicated labor course as follows:  Membrane Rupture Time/Date: 10:15 PM ,01/10/2022   Delivery Method:Vaginal, Spontaneous  Episiotomy: None  Lacerations:  1st degree  Patient had an uncomplicated postpartum course.  She is ambulating, tolerating a regular diet, passing flatus, and urinating well. Patient is discharged home in stable condition on 01/12/22.  Newborn Data: Birth date:01/11/2022  Birth time:5:19 AM  Gender:Female  Living status:Living  Apgars:9 ,9  Weight:3290 g   Magnesium Sulfate received: No BMZ received: No Rhophylac:N/A MMR:No T-DaP:Given prenatally Flu: No Transfusion:No  Physical exam  Vitals:   01/11/22 1356 01/11/22 1737 01/11/22 1938 01/12/22 0445  BP: 98/71 1$RemoveBe'05/68 99/72 95/64 'phDgWrnut$  Pulse: 72  71 73  Resp: $Remo'16 17 16 17  'eLdWW$ Temp: 98.2 F (36.8 C) 98 F (36.7 C) 98.2 F (36.8 C) 98.3 F (36.8 C)  TempSrc: Oral Oral Oral Oral  SpO2:      Weight:       General: alert, cooperative, and no distress Lochia: appropriate Uterine Fundus: firm Incision: N/A DVT Evaluation: No evidence of DVT seen on physical  exam. No significant calf/ankle edema. Labs: Lab Results  Component Value Date   WBC 5.8 01/10/2022   HGB 13.7 01/10/2022   HCT 40.5 01/10/2022   MCV 100.2 (H) 01/10/2022   PLT 176 01/10/2022      Latest Ref Rng & Units 07/30/2021    3:29 PM  CMP  Glucose 70 - 99 mg/dL 104   BUN 6 - 20 mg/dL 10   Creatinine 0.57 - 1.00 mg/dL 0.41   Sodium 134 - 144 mmol/L 136   Potassium 3.5 - 5.2 mmol/L 3.9   Chloride 96 - 106 mmol/L 101   CO2 20 - 29 mmol/L 22   Calcium 8.7 - 10.2 mg/dL 9.3   Total Protein 6.0 - 8.5 g/dL 6.3   Total Bilirubin 0.0 - 1.2 mg/dL <0.2   Alkaline Phos 44 - 121 IU/L 49   AST 0 - 40 IU/L 13   ALT 0 - 32 IU/L 15    Edinburgh Score:    12/14/2016    8:35 AM  Edinburgh Postnatal Depression Scale Screening Tool  I have been able to laugh and see the funny side of things. 0  I have looked forward with enjoyment to things. 1  I have blamed myself unnecessarily when things went wrong. 0  I have been anxious or worried for no good reason. 2  I have felt scared or panicky for  no good reason. 0  Things have been getting on top of me. 3  I have been so unhappy that I have had difficulty sleeping. 0  I have felt sad or miserable. 0  I have been so unhappy that I have been crying. 0  The thought of harming myself has occurred to me. 0  Edinburgh Postnatal Depression Scale Total 6      After visit meds:  Allergies as of 01/12/2022   No Known Allergies      Medication List     STOP taking these medications    aspirin EC 81 MG tablet   progesterone 200 MG capsule Commonly known as: Prometrium       TAKE these medications    acetaminophen 325 MG tablet Commonly known as: Tylenol Take 2 tablets (650 mg total) by mouth every 6 (six) hours as needed.   ibuprofen 600 MG tablet Commonly known as: ADVIL Take 1 tablet (600 mg total) by mouth every 6 (six) hours.   PRENATAL PO Take 1 capsule by mouth daily.         Discharge home in stable  condition Infant Feeding: Bottle and Breast Infant Disposition:home with mother Discharge instruction: per After Visit Summary and Postpartum booklet. Activity: Advance as tolerated. Pelvic rest for 6 weeks.  Diet: routine diet Anticipated Birth Control: Condoms Postpartum Appointment:4 weeks Additional Postpartum F/U:  n/a  Future Appointments: Future Appointments  Date Time Provider [place]  01/12/2022 10:15 AM Danielle Rankin [facility]  01/19/2022 10:55 AM Clarnce Flock, MD [facility]  01/27/2022  1:15 PM Clarnce Flock, MD [facility]  01/27/2022  2:15 PM WMC-WOCA NST [facility]   Follow up Visit:   Apolonio Schneiders, MD Resident Physician 01/12/2022   Attestation:  I confirm that I have verified the information documented in the resident's note and that I have also personally reperformed the physical exam and all medical decision making activities.   Patient was seen and examined by me also Agree with note Vitals stable Labs stable Fundus firm, lochia within normal limits Perineum healing Ext WNL Continue care Ready for discharge  Seabron Spates, CNM

## 2022-01-11 NOTE — Progress Notes (Signed)
Ordered pt meals by Juliann Mule Spanish Interpreter.

## 2022-01-11 NOTE — H&P (Incomplete)
Alyssa Wallace is a 36 y.o. female presenting for ***. OB History     Gravida  5   Para  3   Term  1   Preterm  2   AB  1   Living  3      SAB  1   IAB  0   Ectopic  0   Multiple  0   Live Births  3                   Nursing Staff Provider  Office Location  [facility] Dating  LMP  [facility] Model '[x]'$  Traditional '[ ]'$  Centering '[ ]'$  Mom-Baby Dyad      Language  Spanish  Anatomy US  WNL incomplete f/u 4 wks, LLP 0.98 cm   Flu Vaccine    Genetic/Carrier Screen  NIPS: Low risk  female AFP:    Horizon: Negative x 4  TDaP Vaccine  10/29/21 Hgb A1C or  GTT Early 194 Third trimester   COVID Vaccine     LAB RESULTS   Rhogam  NA Blood Type O/Positive/-- (03/02 0000)   Baby Feeding Plan Breast Antibody Negative (03/02 0000)  Contraception Condom Rubella Immune (03/02 0000)  Circumcision No RPR Nonreactive (03/02 0000)   Pediatrician    HBsAg Negative (03/02 0000)   Support Person Husband  HCVAb Negative  Prenatal Classes   HIV Non-reactive (03/02 0000)     BTL Consent NA GBS     Neg   (For PCN allergy, check sensitivities)   VBAC Consent 10/29/21 Pap ASCUS -ve HPV 07/18/20 [facility]           DME Rx '[ ]'$  BP cuff '[ ]'$  Weight Scale Waterbirth  '[ ]'$  Class '[ ]'$  Consent '[ ]'$  CNM visit  PHQ9 & GAD7 [  ] new OB '[x]'$  28 weeks  [  ] 36 weeks Induction  '[ ]'$  Orders Entered '[ ]'$ Foley Y/N      Past Medical History:  Diagnosis Date  . Gestational diabetes   . Preterm labor    Past Surgical History:  Procedure Laterality Date  . CERVICAL CERCLAGE N/A 07/30/2016   Procedure: CERCLAGE CERVICAL;  Surgeon: Woodroe Mode, MD;  Location: [facility];  Service: Gynecology;  Laterality: N/A;  . CESAREAN SECTION  04/05/2012   Procedure: CESAREAN SECTION;  Surgeon: Woodroe Mode, MD;  Location: [facility];  Service: Obstetrics;  Laterality: N/A;  Primary Cesarean Section Delivery Girl @ [phone], Apgars 5/8  . GANGLION CYST EXCISION Right 08/17/2017   Procedure: REMOVAL GANGLION OF RIGHT WRIST;  Surgeon: Leandrew Koyanagi, MD;  Location: [facility];  Service: Orthopedics;  Laterality: Right;   Family History: family history includes Diabetes in her father, mother, and sister. Social History:  reports that she has never smoked. She has never used smokeless tobacco. She reports that she does not drink alcohol and does not use drugs.     Maternal Diabetes: {Maternal Diabetes:3043596} Genetic Screening: {Genetic Screening:20205} Maternal Ultrasounds/Referrals: {Maternal Ultrasounds / Referrals:20211} Fetal Ultrasounds or other Referrals:  {Fetal Ultrasounds or Other Referrals:20213} Maternal Substance Abuse:  {Maternal Substance Abuse:20223} Significant Maternal Medications:  {Significant Maternal Meds:20233} Significant Maternal Lab Results:  {Significant Maternal Lab Results:20235} Number of Prenatal Visits:{Prenatal Visits:27860} Other Comments:  {Other Comments:20251}  Review of Systems History   Blood pressure 111/76, pulse 72, temperature (!) 97.4 F (36.3 C), temperature source Oral, resp. rate 20, weight 62.6 kg, last menstrual period 04/19/2021, SpO2 93 %, unknown  if currently breastfeeding. Exam Physical Exam  Prenatal labs: ABO, Rh: O/Positive/-- (03/02 0000) Antibody: Negative (03/02 0000) Rubella: Immune (03/02 0000) RPR: Non Reactive (06/22 0902)  HBsAg: Negative (03/02 0000)  HIV: Non Reactive (06/22 0902)  GBS: Negative/-- (08/21 1659)   Assessment/Plan: ***   Cheryllynn Sarff 01/11/2022, 12:03 AM

## 2022-01-11 NOTE — H&P (Addendum)
Alyssa Wallace is a 36 y.o. female presenting for PROM at [redacted]w[redacted]d Interpretor used throughout  Pregnancy complicated by GDMA1, hx of 2x preterm deliveries(on prometrium until 36wk), 1x CS for breech, and 1x VBAC(TOLAC consent signed 7.19), AMA, lanuage barrier(Spanish speaking), low lying placenta 2nd trimester, 1.97cm from os at 333w3d  Presenting as transfer from MAU for SROM '@2215'$ (9/3) for IOL.   Infrequent nonpainful contractions. No VB.   OB History     Gravida  5   Para  3   Term  1   Preterm  2   AB  1   Living  3      SAB  1   IAB  0   Ectopic  0   Multiple  0   Live Births  3           # Outcome Date GA Labor/2nd Weight Sex Delivery Anes PTL Lv A1 A5  1 Preterm 01/17/06 [id number] g M Vag-Spont Epidural Y Living      2 Preterm 04/05/12 33w85w5d62 g F CS-LTranv Spinal  Living 5 8  Name: Alyssa Wallace: Other  Delivering Clinician: ArnoWoodroe Mode    3 SAB 2017              4 Term 12/13/16 38w067w0d 23m 259mg F VBAC Epidural  Living 9 10  Name: Alyssa Wallace: Other  Delivering Clinician: Hogan,Tresea Mall   5 Current                       Nursing Staff Provider  Office Location  [facility] Dating  LMP  [facility] '[x]'$  Traditional '[ ]'$  Centering '[ ]'$  Mom-Baby Dyad      Language  Spanish  Anatomy US  WNKoreaincomplete f/u 4 wks, LLP 0.98 cm   Flu Vaccine    Genetic/Carrier Screen  NIPS: Low risk  female AFP:    Horizon: Negative x 4  TDaP Vaccine  10/29/21 Hgb A1C or  GTT Early 194 Third trimester   COVID Vaccine     LAB RESULTS   Rhogam  NA Blood Type O/Positive/-- (03/02 0000)   Baby Feeding Plan Breast Antibody Negative (03/02 0000)  Contraception Condom Rubella Immune (03/02 0000)  Circumcision No RPR Nonreactive (03/02 0000)   Pediatrician    HBsAg Negative (03/02 0000)   Support Person Husband  HCVAb Negative  Prenatal Classes   HIV Non-reactive (03/02 0000)     BTL Consent  NA GBS     Neg   (For PCN allergy, check sensitivities)   VBAC Consent 10/29/21 Pap ASCUS -ve HPV 07/18/20 [facility]           DME Rx '[ ]'$  BP cuff '[ ]'$  Weight Scale Waterbirth  '[ ]'$  Class '[ ]'$  Consent '[ ]'$  CNM visit  PHQ9 & GAD7 [  ] new OB '[x]'$  28 weeks  [  ] 36 weeks Induction  '[ ]'$  Orders Entered '[ ]'$ Foley Y/N      Past Medical History:  Diagnosis Date   Gestational diabetes    Preterm labor    Past Surgical History:  Procedure Laterality Date   CERVICAL CERCLAGE N/A 07/30/2016   Procedure: CERCLAGE CERVICAL;  Surgeon: James Woodroe Mode [facility] [facility] Service: Gynecology;  Laterality: N/A;   CESAREAN SECTION  04/05/2012   Procedure: CESAREAN SECTION;  Surgeon: James Woodroe Mode  Location: [facility];  Service: Obstetrics;  Laterality: N/A;  Primary Cesarean Section Delivery Girl @ [phone], Apgars 5/8   GANGLION CYST EXCISION Right 08/17/2017   Procedure: REMOVAL GANGLION OF RIGHT WRIST;  Surgeon: Leandrew Koyanagi, MD;  Location: [place];  Service: Orthopedics;  Laterality: Right;   Family History: family history includes Diabetes in her father, mother, and sister. Social History:  reports that she has never smoked. She has never used smokeless tobacco. She reports that she does not drink alcohol and does not use drugs.     Maternal Diabetes: Yes:  Diabetes Type:  Diet controlled Genetic Screening: Normal Maternal Ultrasounds/Referrals: Other: Low lying placenta, 1.97cm from os as of 11/25/21 Fetal Ultrasounds or other Referrals:  None Maternal Substance Abuse:  No Significant Maternal Medications:  None Significant Maternal Lab Results:  Group B Strep negative Number of Prenatal Visits:greater than 3 verified prenatal visits Other Comments:  None  Review of Systems  Constitutional:  Negative for chills and fever.  Eyes:  Negative for visual disturbance.  Respiratory:  Negative for shortness of breath.   Gastrointestinal:  Positive for abdominal pain. Negative for  diarrhea, nausea and vomiting.  Genitourinary:  Positive for pelvic pain and vaginal discharge. Negative for vaginal bleeding.  Neurological:  Negative for weakness.   Maternal Medical History:  Reason for admission: Rupture of membranes.  Nausea.  Contractions: Onset was 3-5 hours ago.   Frequency: irregular.   Perceived severity is mild.   Fetal activity: Perceived fetal activity is normal.   Last perceived fetal movement was within the past hour.   Prenatal complications: No bleeding, PIH, placental abnormality (Previously low lying, now normal) or pre-eclampsia.   Prenatal Complications - Diabetes: gestational. Diabetes is managed by diet.       Blood pressure 111/76, pulse 72, temperature (!) 97.4 F (36.3 C), temperature source Oral, resp. rate 20, weight 62.6 kg, last menstrual period 04/19/2021, SpO2 93 %, unknown if currently breastfeeding. Maternal Exam:  Uterine Assessment: Contraction strength is mild.  Contraction frequency is irregular.  Abdomen: Patient reports no abdominal tenderness. Fetal presentation: vertex Introitus: Normal vulva. Normal vagina.  Ferning test: positive.  Nitrazine test: not done. Amniotic fluid character: clear. Pelvis: adequate for delivery.   Cervix: Cervix evaluated by digital exam.     Fetal Exam Fetal Monitor Review: Mode: ultrasound.   Baseline rate: 140.  Variability: moderate (6-25 bpm).   Pattern: accelerations present and no decelerations.   Fetal State Assessment: Category I - tracings are normal.   Physical Exam Constitutional:      General: She is not in acute distress.    Appearance: She is not ill-appearing or toxic-appearing.  HENT:     Head: Normocephalic.  Cardiovascular:     Rate and Rhythm: Normal rate.  Pulmonary:     Effort: Pulmonary effort is normal.  Abdominal:     General: There is no distension.     Tenderness: There is no abdominal tenderness. There is no guarding.  Genitourinary:    General: Normal  vulva.     Comments: Dilation: 6 Effacement (%): 50 Station: -1 Presentation: Vertex Exam by:: Dr. Gillermina Hu  Musculoskeletal:        General: Normal range of motion.     Cervical back: Normal range of motion.  Skin:    General: Skin is warm and dry.  Neurological:     General: No focal deficit present.     Mental Status: She is alert.  Psychiatric:  Mood and Affect: Mood normal.     Prenatal labs: ABO, Rh: O/Positive/-- (03/02 0000) Antibody: Negative (03/02 0000) Rubella: Immune (03/02 0000) RPR: Non Reactive (06/22 0902)  HBsAg: Negative (03/02 0000)  HIV: Non Reactive (06/22 0902)  GBS: Negative/-- (08/21 1659)   Assessment/Plan: Single IUP at 29w1dSpontaneous rupture of membranes Latent vs Early active labor Reactive nonstress test  Admit to Labor and Delivery Routine orders Offered augmentation of labor but patient prefers expectant management Plans to breast feed. Expecting a boy, no circumcision.  Condoms for contraception.  AJanice NorrieMD PGY3 01/11/2022, 12:03 AM

## 2022-01-11 NOTE — Lactation Note (Signed)
This note was copied from a baby's chart. Lactation Consultation Note  Patient Name: Boy Gladiola Madore VKPQA'E Date: 01/11/2022 Reason for consult: Initial assessment;Early term 37-38.6wks;Breastfeeding assistance Age:37 hours  Spanish Interpreter iPad used Rosemarie Ax #[id number]  P4, Early term, Infant Female  [place] entered the room and baby was asleep in the bassinet.  The birth parent states that things are going well with breastfeeding.  She is an experienced breastfeeding parent and has no questions or concerns.  The birth parent does not have a breast pump at home.  She states that she has [place], but does not want LC to send a [facility] referral.   Current Feeding Plan:  Breastfeed according to feeding cues 8+ times in 24 hours.  Hand express for stimulation and feed expressed milk to baby via spoon.  Call [place] for assistance with breastfeeding.   Maternal Data Has patient been taught Hand Expression?: Yes Does the patient have breastfeeding experience prior to this delivery?: Yes How long did the patient breastfeed?: 1 year  Feeding Mother's Current Feeding Choice: Breast Milk and Formula  Interventions Interventions: [facility] brochure  Discharge Pump:  (No pump at home.) [facility] Program: Yes  Consult Status Consult Status: Follow-up Date: 01/12/22 Follow-up type: In-patient    Elly Modena Oona Trammel 01/11/2022, 10:40 AM

## 2022-01-12 ENCOUNTER — Encounter: Payer: Self-pay | Admitting: Medical

## 2022-01-12 MED ORDER — IBUPROFEN 600 MG PO TABS: 600.0000 mg | ORAL_TABLET | Freq: Four times a day (QID) | ORAL | 0 refills | Status: AC

## 2022-01-12 MED ORDER — ACETAMINOPHEN 325 MG PO TABS: 650.0000 mg | ORAL_TABLET | Freq: Four times a day (QID) | ORAL | 0 refills | Status: AC | PRN

## 2022-01-12 NOTE — Social Work (Signed)
CSW received consult due to score 13 on Edinburgh Postnatal Depression Screen. CSW met with MOB and completed assessment. CSW entered the room, introduced self, CSW role and reason for visit using Gianna #[id number] with interpreter services. MOB was agreeable to visit. CSW inquired about how MOB was feeling over the past 7 days. MOB reported she has been goon and reported no issues. CSW inquired about MOB reporting feeling anxious and panicky, MOB reported she has been fine and has not felt anxious at all. CSW provided education regarding Baby Blues vs PMADs and provided MOB with resources for mental health follow up.  CSW encouraged MOB to evaluate her mental health throughout the postpartum period with the use of the New Mom Checklist developed by Postpartum [employer] as well as the Edinburgh Postnatal Depression Scale and notify a medical professional if symptoms arise. MOB reported her spouse is her support. CSW inquired if MOB had all necessary items for the infant. MOB reported they are buying diaper and wipes today. MOB reported they do not have a separate sleeping space for the infant. CSW provided Pack n Play to the family. MOB was appreciative. CSW inquired about community resources. MOB is interested in [facility]. CSW made referral to [facility].   CSW identifies no further need for intervention and no barriers to discharge at this time.  Indea Dearman, LCSWA Clinical Social Worker [phone]  

## 2022-01-13 ENCOUNTER — Telehealth: Payer: Self-pay | Admitting: Family Medicine

## 2022-01-13 ENCOUNTER — Telehealth (HOSPITAL_COMMUNITY): Payer: Self-pay | Admitting: *Deleted

## 2022-01-13 DIAGNOSIS — Z1331 Encounter for screening for depression: Secondary | ICD-10-CM

## 2022-01-13 NOTE — Telephone Encounter (Signed)
Inpatient EPDS of 13. CSW consult completed. Ambulatory IBH referral made now. Dr. Si Raider notified via chart.  Odis Hollingshead, RN 01-13-2022 at 11:32am

## 2022-01-13 NOTE — Telephone Encounter (Signed)
Patient want to know if she need to continue to take "Baby Aspirin"

## 2022-01-13 NOTE — Telephone Encounter (Signed)
Called patient back with Alyssa Wallace assisting with spanish interpretation and discussed she can discontinue the aspirin now that she has delivered. Patient verbalized understanding.

## 2022-01-19 ENCOUNTER — Encounter: Payer: Self-pay | Admitting: Family Medicine

## 2022-01-21 ENCOUNTER — Telehealth (HOSPITAL_COMMUNITY): Payer: Self-pay | Admitting: *Deleted

## 2022-01-21 NOTE — Telephone Encounter (Signed)
Mom reports feeling good. No concerns about herself at this time. EPDS=8 [facility] score=13) Mom reports baby is doing well. Feeding, peeing, and pooping without difficulty. Safe sleep reviewed. Mom reports no concerns about baby at present.  Odis Hollingshead, RN 01-21-2022 at 3:30pm

## 2022-01-21 NOTE — Progress Notes (Deleted)
Integrated Behavioral Health via Telemedicine Visit  01/21/2022 Alyssa Wallace [id number]  Number of Integrated Behavioral Health Clinician visits: No data recorded Session Start time: No data recorded  Session End time: No data recorded Total time in minutes: No data recorded  Referring Provider: Dr. Ailene Rud [facility] Patient/Family location: [facility] Provider location: *** All persons participating in visit: Pt Types of Service: {CHL AMB TYPE OF [phone]}  I connected with Alyssa Wallace and/or Alyssa Wallace's {family members:20773} via  Telephone or Video Enabled Telemedicine Application  (Video is Caregility application) and verified that I am speaking with the correct person using two identifiers. Discussed confidentiality: Yes   I discussed the limitations of telemedicine and the availability of in person appointments.  Discussed there is a possibility of technology failure and discussed alternative modes of communication if that failure occurs.  I discussed that engaging in this telemedicine visit, they consent to the provision of behavioral healthcare and the services will be billed under their insurance.  Patient and/or legal guardian expressed understanding and consented to Telemedicine visit: Yes   Presenting Concerns: Patient and/or family reports the following symptoms/concerns: Positive Depression Screening  Duration of problem: ***; Severity of problem: {Mild/Moderate/Severe:20260}  Patient and/or Family's Strengths/Protective Factors: {CHL AMB BH PROTECTIVE FACTORS:[phone]}  Goals Addressed: Patient will:  Reduce symptoms of: depression ***  Increase knowledge and/or ability of: {IBH Patient Tools:21014057}   Demonstrate ability to: {IBH Goals:21014053}  Progress towards Goals: {CHL AMB BH PROGRESS TOWARDS [phone]}  Interventions: Interventions utilized:  {IBH Interventions:21014054} Standardized Assessments  completed: {IBH Screening Tools:21014051}  Patient and/or Family Response: ***  Assessment: Patient currently experiencing ***.   Patient may benefit from ***.  Plan: Follow up with behavioral health clinician on : *** Behavioral recommendations: *** Referral(s): {IBH Referrals:21014055}  I discussed the assessment and treatment plan with the patient and/or parent/guardian. They were provided an opportunity to ask questions and all were answered. They agreed with the plan and demonstrated an understanding of the instructions.   They were advised to call back or seek an in-person evaluation if the symptoms worsen or if the condition fails to improve as anticipated.  Alba Destine

## 2022-01-22 NOTE — BH Specialist Note (Deleted)
Integrated Behavioral Health via Telemedicine Visit  01/22/2022 [name] [id number]  Number of Integrated Behavioral Health Clinician visits: No data recorded Session Start time: No data recorded  Session End time: No data recorded Total time in minutes: No data recorded  Referring Provider: Dr. Malcolm Metro Patient/Family location: [facility] Provider location: [facility] All persons participating in visit: *** Types of Service: Individual psychotherapy  I connected with Alyssa Wallace and/or Alyssa Wallace's {family members:20773} via  Telephone or Video Enabled Telemedicine Application  (Video is Caregility application) and verified that I am speaking with the correct person using two identifiers. Discussed confidentiality: {YES/NO:21197}  I discussed the limitations of telemedicine and the availability of in person appointments.  Discussed there is a possibility of technology failure and discussed alternative modes of communication if that failure occurs.  I discussed that engaging in this telemedicine visit, they consent to the provision of behavioral healthcare and the services will be billed under their insurance.  Patient and/or legal guardian expressed understanding and consented to Telemedicine visit: Yes   Presenting Concerns: Patient and/or family reports the following symptoms/concerns: Positive Depression Screen *** Duration of problem: ***; Severity of problem: {Mild/Moderate/Severe:20260}  Patient and/or Family's Strengths/Protective Factors: {CHL AMB BH PROTECTIVE FACTORS:[phone]}  Goals Addressed: Patient will:  Reduce symptoms of: {IBH Symptoms:21014056}   Increase knowledge and/or ability of: {IBH Patient Tools:21014057}   Demonstrate ability to: {IBH Goals:21014053}  Progress towards Goals: {CHL AMB BH PROGRESS TOWARDS [phone]}  Interventions: Interventions utilized:  {IBH Interventions:21014054} Standardized Assessments  completed: {IBH Screening Tools:21014051}  Patient and/or Family Response: ***  Assessment: Patient currently experiencing ***.   Patient may benefit from ***.  Plan: Follow up with behavioral health clinician on : *** Behavioral recommendations: *** Referral(s): {IBH Referrals:21014055}  I discussed the assessment and treatment plan with the patient and/or parent/guardian. They were provided an opportunity to ask questions and all were answered. They agreed with the plan and demonstrated an understanding of the instructions.   They were advised to call back or seek an in-person evaluation if the symptoms worsen or if the condition fails to improve as anticipated.  Alba Destine

## 2022-01-27 ENCOUNTER — Encounter: Payer: Self-pay | Admitting: Family Medicine

## 2022-01-27 ENCOUNTER — Other Ambulatory Visit: Payer: Self-pay

## 2022-02-22 ENCOUNTER — Encounter: Payer: Self-pay | Admitting: Obstetrics and Gynecology

## 2022-02-22 ENCOUNTER — Ambulatory Visit (INDEPENDENT_AMBULATORY_CARE_PROVIDER_SITE_OTHER): Payer: Self-pay | Admitting: Obstetrics and Gynecology

## 2022-02-22 NOTE — Progress Notes (Signed)
[facility] Visit Note  Alyssa Wallace is a 36 y.o. [phone] female who presents for a postpartum visit. She is 5 weeks postpartum following a normal spontaneous vaginal delivery.  I have fully reviewed the prenatal and intrapartum course. The delivery was at 52 gestational weeks.  Anesthesia: local. Postpartum course has been good. Baby is doing well. Baby is feeding by breast. Bleeding no bleeding. Bowel function is normal. Bladder function is normal. Patient is not sexually active. Contraception method is condoms. Postpartum depression screening: negative.   The pregnancy intention screening data noted above was reviewed. Potential methods of contraception were discussed. The patient elected to proceed with No data recorded.   Edinburgh Postnatal Depression Scale - 02/22/22 1544       Edinburgh Postnatal Depression Scale:  In the Past 7 Days   I have been able to laugh and see the funny side of things. 0    I have looked forward with enjoyment to things. 0    I have blamed myself unnecessarily when things went wrong. 0    I have been anxious or worried for no good reason. 0    I have felt scared or panicky for no good reason. 0    Things have been getting on top of me. 0    I have been so unhappy that I have had difficulty sleeping. 0    I have felt sad or miserable. 0    I have been so unhappy that I have been crying. 0    The thought of harming myself has occurred to me. 0    Edinburgh Postnatal Depression Scale Total 0             Health Maintenance Due  Topic Date Due   INFLUENZA VACCINE  12/08/2021    The following portions of the patient's history were reviewed and updated as appropriate: allergies, current medications, past family history, past medical history, past social history, past surgical history, and problem list.  Review of Systems Pertinent items are noted in HPI.  Objective:  BP 99/73   Pulse 65   Ht '4\' 11"'$  (1.499 m)   Wt 122 lb (55.3 kg)    LMP 04/19/2021 (Exact Date)   Breastfeeding Yes   BMI 24.64 kg/m    General:  alert, cooperative, and no distress   Breasts:  not indicated  Lungs: clear to auscultation bilaterally  Heart:  regular rate and rhythm  Abdomen: soft, non-tender; bowel sounds normal; no masses,  no organomegaly   Wound N/a  GU exam:  not indicated       Assessment:    Encounter for postpartum care normal postpartum exam.   Plan:   Essential components of care per ACOG recommendations:  1.  Mood and well being: Patient with negative depression screening today. Reviewed local resources for support.  - Patient tobacco use? No.   - hx of drug use? No.    2. Infant care and feeding:  -Patient currently breastmilk feeding? Yes. Reviewed importance of draining breast regularly to support lactation.  -Social determinants of health (SDOH) reviewed in EPIC. No concerns.  3. Sexuality, contraception and birth spacing - Patient does not want a pregnancy in the next year.  Desired family size is 4 children.  - Reviewed reproductive life planning. Reviewed contraceptive methods based on pt preferences and effectiveness.  Patient desired Female Condom today.   - Discussed birth spacing of 18 months  4. Sleep and fatigue -Encouraged family/partner/community  support of 4 hrs of uninterrupted sleep to help with mood and fatigue  5. Physical Recovery  - Discussed patients delivery and complications. She describes her labor as good. - Patient had a Vaginal, no problems at delivery. Patient had a 1st degree laceration. Perineal healing reviewed. Patient expressed understanding - Patient has urinary incontinence? No. - Patient is safe to resume physical and sexual activity  6.  Health Maintenance - HM due items addressed Yes - Last Pap smear 07/18/20, ASCUS with neg HPV,  pap  not done at today's visit.  -Breast Cancer screening indicated? No.   7. Chronic Disease/Pregnancy Condition follow up: None  - PCP  follow up  Griffin Basil, [place] for [facility], [place]

## 2022-02-22 NOTE — Progress Notes (Signed)
.  cw

## 2022-03-12 ENCOUNTER — Ambulatory Visit: Payer: Self-pay | Admitting: Clinical

## 2022-03-12 DIAGNOSIS — Z658 Other specified problems related to psychosocial circumstances: Secondary | ICD-10-CM

## 2022-03-12 NOTE — BH Specialist Note (Signed)
Integrated Behavioral Health Initial In-Person Visit  MRN: [id number] Name: [name]  I reviewed patient visit with the [facility] Intern, and I concur with the treatment plan, as documented in the [facility] Intern note.   No charge for this visit due to [facility] Intern seeing patient.  Vesta Mixer, MSW, [place] for [facility] at [facility]   Number of [place] Clinician visits: 1- Initial Visit  Session Start time: [phone]    Session End time: 0911  Total time in minutes: 4   Types of Service: Individual psychotherapy  Interpretor:Yes.   Interpretor Name and Language: Interpreter # [id number], Spanish  Subjective: [name] is a 36 y.o. female accompanied by  Patient was referred by Laurey Arrow, MD  for positive depression screening . [place] Intern went back to confirm and pt actually scored an 8 on the [place] at 11 days post-partum.  Patient reports the following symptoms/concerns: Pt states having no concerns at this time. Pt states she is sleeping well, eating well, and reports no depressive or anxious symptoms at this time.   Plan: Follow up with behavioral health clinician on : Pt encouraged to call [facility] Intern if anything changes with her mood for an appointment. Pt declined being scheduled at this time but will follow-up if they need to.  Behavioral recommendations:  -Continue prioritizing healthy self-care (regular meals, adequate rest; allowing practical help from supportive friends and family) until at least postpartum medical appointment -Consider new mom support group as needed at either [email or web address].net or [email or web address] ; Referral(s): [place] (In Clinic)  [place]

## 2023-09-19 ENCOUNTER — Emergency Department (HOSPITAL_COMMUNITY)
Admission: EM | Admit: 2023-09-19 | Discharge: 2023-09-20 | Discharge status: Against Medical Advice | Payer: Self-pay | Attending: Emergency Medicine | Admitting: Emergency Medicine

## 2023-09-19 ENCOUNTER — Encounter (HOSPITAL_COMMUNITY): Payer: Self-pay | Admitting: Emergency Medicine

## 2023-09-19 ENCOUNTER — Other Ambulatory Visit: Payer: Self-pay

## 2023-09-19 DIAGNOSIS — R300 Dysuria: Secondary | ICD-10-CM | POA: Insufficient documentation

## 2023-09-19 DIAGNOSIS — Z5321 Procedure and treatment not carried out due to patient leaving prior to being seen by health care provider: Secondary | ICD-10-CM | POA: Insufficient documentation

## 2023-09-19 DIAGNOSIS — L292 Pruritus vulvae: Secondary | ICD-10-CM | POA: Insufficient documentation

## 2023-09-19 LAB — URINALYSIS, ROUTINE W REFLEX MICROSCOPIC
Bilirubin Urine: NEGATIVE
Glucose, UA: NEGATIVE mg/dL
Hgb urine dipstick: NEGATIVE
Ketones, ur: NEGATIVE mg/dL
Nitrite: NEGATIVE
Protein, ur: NEGATIVE mg/dL
Specific Gravity, Urine: 1.015 (ref 1.005–1.030)
pH: 5 (ref 5.0–8.0)

## 2023-09-19 LAB — PREGNANCY, URINE: Preg Test, Ur: NEGATIVE

## 2023-09-19 NOTE — ED Triage Notes (Signed)
 Pt c/o vaginal itch and burning x 1 week. Also endorses burning with urination.

## 2023-09-20 NOTE — ED Notes (Signed)
 Pt called for vitals, no response. No sight of pt in waiting area.

## 2023-12-06 ENCOUNTER — Encounter: Payer: Self-pay | Admitting: Family

## 2023-12-06 ENCOUNTER — Other Ambulatory Visit (HOSPITAL_COMMUNITY)
Admission: RE | Admit: 2023-12-06 | Discharge: 2023-12-06 | Disposition: A | Discharge status: Home / Self Care | Payer: Self-pay | Source: Ambulatory Visit | Attending: Family | Admitting: Family

## 2023-12-06 ENCOUNTER — Ambulatory Visit (INDEPENDENT_AMBULATORY_CARE_PROVIDER_SITE_OTHER): Payer: Self-pay | Admitting: Family

## 2023-12-06 ENCOUNTER — Ambulatory Visit: Payer: Self-pay | Admitting: Family

## 2023-12-06 VITALS — BP 107/70 | HR 74 | Temp 98.0°F | Resp 16 | Ht 60.0 in | Wt 116.6 lb

## 2023-12-06 DIAGNOSIS — R21 Rash and other nonspecific skin eruption: Secondary | ICD-10-CM

## 2023-12-06 DIAGNOSIS — N898 Other specified noninflammatory disorders of vagina: Secondary | ICD-10-CM

## 2023-12-06 DIAGNOSIS — Z7689 Persons encountering health services in other specified circumstances: Secondary | ICD-10-CM

## 2023-12-06 DIAGNOSIS — Z603 Acculturation difficulty: Secondary | ICD-10-CM

## 2023-12-06 LAB — POCT URINALYSIS DIP (CLINITEK)
Bilirubin, UA: NEGATIVE
Blood, UA: NEGATIVE
Glucose, UA: NEGATIVE mg/dL
Ketones, POC UA: NEGATIVE mg/dL
Nitrite, UA: NEGATIVE
POC PROTEIN,UA: NEGATIVE
Spec Grav, UA: 1.015 (ref 1.010–1.025)
Urobilinogen, UA: 0.2 U/dL
pH, UA: 6.5 (ref 5.0–8.0)

## 2023-12-06 MED ORDER — TRIAMCINOLONE ACETONIDE 0.025 % EX CREA: 1.0000 | TOPICAL_CREAM | Freq: Two times a day (BID) | CUTANEOUS | 1 refills | Status: AC

## 2023-12-06 NOTE — Progress Notes (Signed)
 Subjective:    Alyssa Wallace - 38 y.o. female MRN [id number]  Date of birth: 09-19-1985  HPI  Alyssa Wallace is to establish care.   Current issues and/or concerns: - Reports vaginal itching for 4 months. Denies red flag symptoms. States she has been seen at Urgent Care and prescribed medications which did not help. States Urgent Care never collected labs.  - Reports itching rash of left lower extremity. Denies red flag symptoms.   ROS per HPI     Health Maintenance:  Health Maintenance Due  Topic Date Due   HPV VACCINES (1 - 3-dose SCDM series) Never done   Cervical Cancer Screening (HPV/Pap Cotest)  07/19/2023     Past Medical History: Patient Active Problem List   Diagnosis Date Noted   Vaginal delivery 01/11/2022   Premature rupture of membranes 01/10/2022   AMA (advanced maternal age) multigravida 35+ 07/30/2021   Supervision of high risk pregnancy, antepartum 07/24/2021   Gestational diabetes 07/24/2021   Has less than high school education 07/24/2021   Low lying placenta nos or without hemorrhage, second trimester 09/20/2016   History of VBAC 06/14/2016   Previous preterm delivery x 2, antepartum 06/14/2016   Language barrier 06/14/2016      Social History   reports that she has never smoked. She has never used smokeless tobacco. She reports that she does not drink alcohol and does not use drugs.   Family History  family history includes Diabetes in her father, mother, and sister.   Medications: reviewed and updated   Objective:   Physical Exam BP 107/70   Pulse 74   Temp 98 F (36.7 C) (Oral)   Resp 16   Ht 5' (1.524 m)   Wt 116 lb 9.6 oz (52.9 kg)   LMP 12/02/2023   SpO2 96%   Breastfeeding Yes   BMI 22.77 kg/m   Physical Exam HENT:     Head: Normocephalic and atraumatic.     Nose: Nose normal.     Mouth/Throat:     Mouth: Mucous membranes are moist.     Pharynx: Oropharynx is clear.  Eyes:     Extraocular  Movements: Extraocular movements intact.     Conjunctiva/sclera: Conjunctivae normal.     Pupils: Pupils are equal, round, and reactive to light.  Cardiovascular:     Rate and Rhythm: Normal rate and regular rhythm.     Pulses: Normal pulses.     Heart sounds: Normal heart sounds.  Pulmonary:     Effort: Pulmonary effort is normal.     Breath sounds: Normal breath sounds.  Musculoskeletal:        General: Normal range of motion.     Cervical back: Normal range of motion and neck supple.  Skin:    General: Skin is warm and dry.     Comments: Hyperpigmented rash left lower extremity (posterior), no drainage.  Neurological:     General: No focal deficit present.     Mental Status: She is alert and oriented to person, place, and time.  Psychiatric:        Mood and Affect: Mood normal.        Behavior: Behavior normal.         Assessment & Plan:  1. Encounter to establish care (Primary) - Patient presents today to establish care. During the interim follow-up with primary provider as scheduled.  - Return for annual physical examination, labs, and health maintenance. Arrive fasting meaning having no food for  at least 8 hours prior to appointment. You may have only water or black coffee. Please take scheduled medications as normal.  2. Vaginal itching - Routine screening.  - Referral to Gynecology for evaluation/management. - Cervicovaginal ancillary only - POCT URINALYSIS DIP (CLINITEK); Future - Ambulatory referral to Gynecology  3. Rash and nonspecific skin eruption - Triamcinolone  as prescribed. Counseled on medication adherence/adverse effects.  - Follow-up with primary provider as scheduled. - triamcinolone  (KENALOG ) 0.025 % cream; Apply 1 Application topically 2 (two) times daily.  Dispense: 60 g; Refill: 1  4. Language barrier - [place] in-person interpreter, [place]   Patient was given clear instructions to go to Emergency Department or return to medical center if  symptoms don't improve, worsen, or new problems develop.The patient verbalized understanding.  I discussed the assessment and treatment plan with the patient. The patient was provided an opportunity to ask questions and all were answered. The patient agreed with the plan and demonstrated an understanding of the instructions.   The patient was advised to call back or seek an in-person evaluation if the symptoms worsen or if the condition fails to improve as anticipated.    Greig Drones, NP 12/06/2023, 9:15 AM Primary Care at [facility]

## 2023-12-06 NOTE — Progress Notes (Signed)
 Vagina itching have been getting treatment and its not working

## 2023-12-07 LAB — CERVICOVAGINAL ANCILLARY ONLY
Bacterial Vaginitis (gardnerella): NEGATIVE
Candida Glabrata: NEGATIVE
Candida Vaginitis: NEGATIVE
Chlamydia: NEGATIVE
Comment: NEGATIVE
Comment: NEGATIVE
Comment: NEGATIVE
Comment: NEGATIVE
Comment: NEGATIVE
Comment: NORMAL
Neisseria Gonorrhea: NEGATIVE
Trichomonas: NEGATIVE

## 2024-01-17 ENCOUNTER — Ambulatory Visit (INDEPENDENT_AMBULATORY_CARE_PROVIDER_SITE_OTHER): Payer: Self-pay | Admitting: Family

## 2024-01-17 ENCOUNTER — Encounter: Payer: Self-pay | Admitting: Family

## 2024-01-17 VITALS — BP 98/65 | HR 66 | Temp 98.0°F | Resp 16 | Ht 60.0 in | Wt 118.6 lb

## 2024-01-17 DIAGNOSIS — Z131 Encounter for screening for diabetes mellitus: Secondary | ICD-10-CM

## 2024-01-17 DIAGNOSIS — Z130 Encounter for screening for diseases of the blood and blood-forming organs and certain disorders involving the immune mechanism: Secondary | ICD-10-CM

## 2024-01-17 DIAGNOSIS — Z124 Encounter for screening for malignant neoplasm of cervix: Secondary | ICD-10-CM

## 2024-01-17 DIAGNOSIS — Z1329 Encounter for screening for other suspected endocrine disorder: Secondary | ICD-10-CM

## 2024-01-17 DIAGNOSIS — Z0000 Encounter for general adult medical examination without abnormal findings: Secondary | ICD-10-CM

## 2024-01-17 DIAGNOSIS — Z758 Other problems related to medical facilities and other health care: Secondary | ICD-10-CM

## 2024-01-17 DIAGNOSIS — Z13228 Encounter for screening for other metabolic disorders: Secondary | ICD-10-CM

## 2024-01-17 DIAGNOSIS — Z13220 Encounter for screening for lipoid disorders: Secondary | ICD-10-CM

## 2024-01-17 DIAGNOSIS — Z603 Acculturation difficulty: Secondary | ICD-10-CM

## 2024-01-17 DIAGNOSIS — Z23 Encounter for immunization: Secondary | ICD-10-CM

## 2024-01-17 NOTE — Progress Notes (Signed)
 Patient ID: Alyssa Wallace, female    DOB: 09-12-1985  MRN: [id number]  CC: Annual Exam  Subjective: Alyssa Wallace is a 38 y.o. female who presents for annual exam.   Her concerns today include:  None.   Patient Active Problem List   Diagnosis Date Noted   Vaginal delivery 01/11/2022   Premature rupture of membranes 01/10/2022   AMA (advanced maternal age) multigravida 35+ 07/30/2021   Supervision of high risk pregnancy, antepartum 07/24/2021   Gestational diabetes 07/24/2021   Has less than high school education 07/24/2021   Low lying placenta nos or without hemorrhage, second trimester 09/20/2016   History of VBAC 06/14/2016   Previous preterm delivery x 2, antepartum 06/14/2016   Language barrier 06/14/2016     Current Outpatient Medications on File Prior to Visit  Medication Sig Dispense Refill   Prenatal Vit-Fe Fumarate-FA (PRENATAL PO) Take 1 capsule by mouth daily.     acetaminophen  (TYLENOL ) 325 MG tablet Take 2 tablets (650 mg total) by mouth every 6 (six) hours as needed. (Patient not taking: Reported on 02/22/2022) 30 tablet 0   ibuprofen  (ADVIL ) 600 MG tablet Take 1 tablet (600 mg total) by mouth every 6 (six) hours. (Patient not taking: Reported on 01/17/2024) 30 tablet 0   triamcinolone  (KENALOG ) 0.025 % cream Apply 1 Application topically 2 (two) times daily. (Patient not taking: Reported on 01/17/2024) 60 g 1   No current facility-administered medications on file prior to visit.    No Known Allergies  Social History   Socioeconomic History   Marital status: Single    Spouse name: Not on file   Number of children: Not on file   Years of education: Not on file   Highest education level: Not on file  Occupational History   Not on file  Tobacco Use   Smoking status: Never   Smokeless tobacco: Never  Vaping Use   Vaping status: Never Used  Substance and Sexual Activity   Alcohol use: No   Drug use: No   Sexual activity: Yes     Birth control/protection: Condom  Other Topics Concern   Not on file  Social History Narrative   Not on file   Social Drivers of Health   Financial Resource Strain: Low Risk  (12/06/2023)   Overall Financial Resource Strain (CARDIA)    Difficulty of Paying Living Expenses: Not hard at all  Food Insecurity: No Food Insecurity (12/06/2023)   Hunger Vital Sign    Worried About Running Out of Food in the Last Year: Never true    Ran Out of Food in the Last Year: Never true  Transportation Needs: No Transportation Needs (12/06/2023)   PRAPARE - Administrator, Civil Service (Medical): No    Lack of Transportation (Non-Medical): No  Physical Activity: Insufficiently Active (12/06/2023)   Exercise Vital Sign    Days of Exercise per Week: 2 days    Minutes of Exercise per Session: 30 min  Stress: No Stress Concern Present (12/06/2023)   [employer] of Occupational Health - Occupational Stress Questionnaire    Feeling of Stress: Not at all  Social Connections: Moderately Integrated (12/06/2023)   Social Connection and Isolation Panel    Frequency of Communication with Friends and Family: Once a week    Frequency of Social Gatherings with Friends and Family: Twice a week    Attends Religious Services: More than 4 times per year    Active Member of Clubs or  Organizations: Patient unable to answer    Attends Club or Organization Meetings: Never    Marital Status: Living with partner  Intimate Partner Violence: Not At Risk (12/06/2023)   Humiliation, Afraid, Rape, and Kick questionnaire    Fear of Current or Ex-Partner: No    Emotionally Abused: No    Physically Abused: No    Sexually Abused: No    Family History  Problem Relation Age of Onset   Diabetes Mother    Diabetes Father    Diabetes Sister     Past Surgical History:  Procedure Laterality Date   CERVICAL CERCLAGE N/A 07/30/2016   Procedure: CERCLAGE CERVICAL;  Surgeon: Lynwood KANDICE Solomons, MD;  Location: [facility];   Service: Gynecology;  Laterality: N/A;   CESAREAN SECTION  04/05/2012   Procedure: CESAREAN SECTION;  Surgeon: Lynwood KANDICE Solomons, MD;  Location: [facility];  Service: Obstetrics;  Laterality: N/A;  Primary Cesarean Section Delivery Girl @ [phone], Apgars 5/8   GANGLION CYST EXCISION Right 08/17/2017   Procedure: REMOVAL GANGLION OF RIGHT WRIST;  Surgeon: Jerri Kay HERO, MD;  Location:  SURGERY CENTER;  Service: Orthopedics;  Laterality: Right;    ROS: Review of Systems Negative except as stated above  PHYSICAL EXAM: BP 98/65   Pulse 66   Temp 98 F (36.7 C) (Oral)   Resp 16   Ht 5' (1.524 m)   Wt 118 lb 9.6 oz (53.8 kg)   LMP 01/14/2024 (Exact Date)   SpO2 97%   Breastfeeding Yes   BMI 23.16 kg/m   Physical Exam HENT:     Head: Normocephalic and atraumatic.     Right Ear: Tympanic membrane, ear canal and external ear normal.     Left Ear: Tympanic membrane, ear canal and external ear normal.     Nose: Nose normal.     Mouth/Throat:     Mouth: Mucous membranes are moist.     Pharynx: Oropharynx is clear.  Eyes:     Extraocular Movements: Extraocular movements intact.     Conjunctiva/sclera: Conjunctivae normal.     Pupils: Pupils are equal, round, and reactive to light.  Neck:     Thyroid: No thyroid mass, thyromegaly or thyroid tenderness.  Cardiovascular:     Rate and Rhythm: Normal rate and regular rhythm.     Pulses: Normal pulses.     Heart sounds: Normal heart sounds.  Pulmonary:     Effort: Pulmonary effort is normal.     Breath sounds: Normal breath sounds.  Chest:     Comments: Patient declined. Abdominal:     General: Bowel sounds are normal.     Palpations: Abdomen is soft.  Genitourinary:    Comments: Patient declined. Musculoskeletal:        General: Normal range of motion.     Right shoulder: Normal.     Left shoulder: Normal.     Right upper arm: Normal.     Left upper arm: Normal.     Right elbow: Normal.     Left elbow: Normal.     Right  forearm: Normal.     Left forearm: Normal.     Right wrist: Normal.     Left wrist: Normal.     Right hand: Normal.     Left hand: Normal.     Cervical back: Normal, normal range of motion and neck supple.     Thoracic back: Normal.     Lumbar back: Normal.     Right hip:  Normal.     Left hip: Normal.     Right upper leg: Normal.     Left upper leg: Normal.     Right knee: Normal.     Left knee: Normal.     Right lower leg: Normal.     Left lower leg: Normal.     Right ankle: Normal.     Left ankle: Normal.     Right foot: Normal.     Left foot: Normal.  Skin:    General: Skin is warm and dry.     Capillary Refill: Capillary refill takes less than 2 seconds.  Neurological:     General: No focal deficit present.     Mental Status: She is alert and oriented to person, place, and time.  Psychiatric:        Mood and Affect: Mood normal.        Behavior: Behavior normal.     ASSESSMENT AND PLAN: 1. Annual physical exam (Primary) - Counseled on 150 minutes of exercise per week as tolerated, healthy eating (including decreased daily intake of saturated fats, cholesterol, added sugars, sodium), STI prevention, and routine healthcare maintenance.  2. Screening for metabolic disorder - Routine screening.  - CMP14+EGFR  3. Screening for deficiency anemia - Routine screening.  - CBC  4. Diabetes mellitus screening - Routine screening.  - Hemoglobin A1c  5. Screening cholesterol level - Routine screening.  - Lipid panel  6. Thyroid disorder screen - Routine screening.  - TSH  7. Cervical cancer screening - Referral to Gynecology for evaluation/management. - Ambulatory referral to Gynecology  8. Language barrier - [employer]. ID#: [id number]   Patient was given the opportunity to ask questions.  Patient verbalized understanding of the plan and was able to repeat key elements of the plan. Patient was given clear instructions to go to Emergency Department or  return to medical center if symptoms don't improve, worsen, or new problems develop.The patient verbalized understanding.   Orders Placed This Encounter  Procedures   CBC   Lipid panel   CMP14+EGFR   Hemoglobin A1c   TSH   Ambulatory referral to Gynecology    Return in about 1 year (around 01/16/2025) for Physical per patient preference.  Greig JINNY Drones, NP

## 2024-01-17 NOTE — Addendum Note (Signed)
 Addended by: FRANCHOT PURVIS CROME on: 01/17/2024 08:32 AM   Modules accepted: Orders

## 2024-01-18 ENCOUNTER — Ambulatory Visit: Payer: Self-pay | Admitting: Family

## 2024-01-18 DIAGNOSIS — Z13220 Encounter for screening for lipoid disorders: Secondary | ICD-10-CM

## 2024-01-18 LAB — CBC
Hematocrit: 43.9 % (ref 34.0–46.6)
Hemoglobin: 14.2 g/dL (ref 11.1–15.9)
MCH: 33.4 pg — ABNORMAL HIGH (ref 26.6–33.0)
MCHC: 32.3 g/dL (ref 31.5–35.7)
MCV: 103 fL — ABNORMAL HIGH (ref 79–97)
Platelets: 255 x10E3/uL (ref 150–450)
RBC: 4.25 x10E6/uL (ref 3.77–5.28)
RDW: 11.9 % (ref 11.7–15.4)
WBC: 5.4 x10E3/uL (ref 3.4–10.8)

## 2024-01-18 LAB — CMP14+EGFR
ALT: 14 IU/L (ref 0–32)
AST: 17 IU/L (ref 0–40)
Albumin: 4.3 g/dL (ref 3.9–4.9)
Alkaline Phosphatase: 59 IU/L (ref 44–121)
BUN/Creatinine Ratio: 38 — ABNORMAL HIGH (ref 9–23)
BUN: 18 mg/dL (ref 6–20)
Bilirubin Total: 0.3 mg/dL (ref 0.0–1.2)
CO2: 21 mmol/L (ref 20–29)
Calcium: 8.7 mg/dL (ref 8.7–10.2)
Chloride: 104 mmol/L (ref 96–106)
Creatinine, Ser: 0.48 mg/dL — ABNORMAL LOW (ref 0.57–1.00)
Globulin, Total: 2.4 g/dL (ref 1.5–4.5)
Glucose: 92 mg/dL (ref 70–99)
Potassium: 4.2 mmol/L (ref 3.5–5.2)
Sodium: 139 mmol/L (ref 134–144)
Total Protein: 6.7 g/dL (ref 6.0–8.5)
eGFR: 124 mL/min/1.73 (ref >59)

## 2024-01-18 LAB — LIPID PANEL
Chol/HDL Ratio: 2.5 ratio (ref 0.0–4.4)
Cholesterol, Total: 183 mg/dL (ref 100–199)
HDL: 72 mg/dL (ref >39)
LDL Chol Calc (NIH): 104 mg/dL — ABNORMAL HIGH (ref 0–99)
Triglycerides: 31 mg/dL (ref 0–149)
VLDL Cholesterol Cal: 7 mg/dL (ref 5–40)

## 2024-01-18 LAB — HEMOGLOBIN A1C
Est. average glucose Bld gHb Est-mCnc: 108 mg/dL
Hgb A1c MFr Bld: 5.4 % (ref 4.8–5.6)

## 2024-01-18 LAB — TSH: TSH: 1.76 u[IU]/mL (ref 0.450–4.500)

## 2025-01-16 ENCOUNTER — Encounter: Payer: Self-pay | Admitting: Family
